# Patient Record
Sex: Male | Born: 1935 | Race: White | Hispanic: No | Marital: Married | Smoking: Former smoker
Health system: Southern US, Community
[De-identification: ages and names within clinical notes are randomized; demographics above are authoritative.]

## PROBLEM LIST (undated history)

## (undated) DIAGNOSIS — D649 Anemia, unspecified: Secondary | ICD-10-CM

## (undated) DIAGNOSIS — Z85038 Personal history of other malignant neoplasm of large intestine: Secondary | ICD-10-CM

## (undated) DIAGNOSIS — G629 Polyneuropathy, unspecified: Secondary | ICD-10-CM

## (undated) DIAGNOSIS — M25552 Pain in left hip: Secondary | ICD-10-CM

## (undated) DIAGNOSIS — R269 Unspecified abnormalities of gait and mobility: Secondary | ICD-10-CM

## (undated) DIAGNOSIS — N400 Enlarged prostate without lower urinary tract symptoms: Secondary | ICD-10-CM

## (undated) DIAGNOSIS — E119 Type 2 diabetes mellitus without complications: Secondary | ICD-10-CM

## (undated) DIAGNOSIS — Z8639 Personal history of other endocrine, nutritional and metabolic disease: Secondary | ICD-10-CM

## (undated) DIAGNOSIS — K922 Gastrointestinal hemorrhage, unspecified: Secondary | ICD-10-CM

## (undated) DIAGNOSIS — R569 Unspecified convulsions: Secondary | ICD-10-CM

## (undated) DIAGNOSIS — R1310 Dysphagia, unspecified: Secondary | ICD-10-CM

## (undated) DIAGNOSIS — I499 Cardiac arrhythmia, unspecified: Secondary | ICD-10-CM

## (undated) DIAGNOSIS — C801 Malignant (primary) neoplasm, unspecified: Secondary | ICD-10-CM

## (undated) DIAGNOSIS — I10 Essential (primary) hypertension: Secondary | ICD-10-CM

## (undated) DIAGNOSIS — I5020 Unspecified systolic (congestive) heart failure: Secondary | ICD-10-CM

## (undated) DIAGNOSIS — I714 Abdominal aortic aneurysm, without rupture: Secondary | ICD-10-CM

## (undated) DIAGNOSIS — I4891 Unspecified atrial fibrillation: Secondary | ICD-10-CM

## (undated) DIAGNOSIS — L723 Sebaceous cyst: Secondary | ICD-10-CM

## (undated) HISTORY — PX: COLON SURGERY: SHX602

## (undated) HISTORY — PX: APPENDECTOMY: SHX54

---
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
## 1998-09-10 ENCOUNTER — Emergency Department (HOSPITAL_COMMUNITY): Admission: EM | Admit: 1998-09-10 | Discharge: 1998-09-10 | Payer: Self-pay | Admitting: Emergency Medicine

## 1998-09-10 ENCOUNTER — Encounter: Payer: Self-pay | Admitting: Emergency Medicine

## 1998-09-11 ENCOUNTER — Encounter: Payer: Self-pay | Admitting: Emergency Medicine

## 1998-09-29 ENCOUNTER — Encounter: Payer: Self-pay | Admitting: General Surgery

## 1998-09-30 ENCOUNTER — Ambulatory Visit (HOSPITAL_COMMUNITY): Admission: RE | Admit: 1998-09-30 | Discharge: 1998-10-01 | Payer: Self-pay | Admitting: General Surgery

## 1998-10-02 ENCOUNTER — Emergency Department (HOSPITAL_COMMUNITY): Admission: EM | Admit: 1998-10-02 | Discharge: 1998-10-02 | Payer: Self-pay | Admitting: Emergency Medicine

## 1998-10-05 ENCOUNTER — Encounter: Payer: Self-pay | Admitting: Surgery

## 1998-10-05 ENCOUNTER — Emergency Department (HOSPITAL_COMMUNITY): Admission: EM | Admit: 1998-10-05 | Discharge: 1998-10-05 | Payer: Self-pay | Admitting: Emergency Medicine

## 1998-10-20 ENCOUNTER — Encounter: Payer: Self-pay | Admitting: Emergency Medicine

## 1998-10-20 ENCOUNTER — Emergency Department (HOSPITAL_COMMUNITY): Admission: EM | Admit: 1998-10-20 | Discharge: 1998-10-20 | Payer: Self-pay | Admitting: Emergency Medicine

## 1998-11-02 ENCOUNTER — Ambulatory Visit (HOSPITAL_COMMUNITY): Admission: RE | Admit: 1998-11-02 | Discharge: 1998-11-02 | Payer: Self-pay | Admitting: Gastroenterology

## 1999-01-17 ENCOUNTER — Emergency Department (HOSPITAL_COMMUNITY): Admission: EM | Admit: 1999-01-17 | Discharge: 1999-01-17 | Payer: Self-pay | Admitting: Emergency Medicine

## 1999-10-04 ENCOUNTER — Emergency Department (HOSPITAL_COMMUNITY): Admission: EM | Admit: 1999-10-04 | Discharge: 1999-10-04 | Payer: Self-pay | Admitting: Emergency Medicine

## 1999-10-24 ENCOUNTER — Ambulatory Visit (HOSPITAL_COMMUNITY): Admission: RE | Admit: 1999-10-24 | Discharge: 1999-10-24 | Payer: Self-pay | Admitting: Orthopedic Surgery

## 1999-10-24 ENCOUNTER — Encounter: Payer: Self-pay | Admitting: Orthopedic Surgery

## 1999-11-03 ENCOUNTER — Encounter: Admission: RE | Admit: 1999-11-03 | Discharge: 1999-12-05 | Payer: Self-pay | Admitting: Orthopedic Surgery

## 1999-12-04 ENCOUNTER — Encounter: Payer: Self-pay | Admitting: Orthopedic Surgery

## 1999-12-04 ENCOUNTER — Ambulatory Visit (HOSPITAL_BASED_OUTPATIENT_CLINIC_OR_DEPARTMENT_OTHER): Admission: RE | Admit: 1999-12-04 | Discharge: 1999-12-05 | Payer: Self-pay | Admitting: Orthopedic Surgery

## 1999-12-07 ENCOUNTER — Encounter: Admission: RE | Admit: 1999-12-07 | Discharge: 2000-03-06 | Payer: Self-pay | Admitting: Orthopedic Surgery

## 2000-05-30 ENCOUNTER — Inpatient Hospital Stay (HOSPITAL_COMMUNITY): Admission: EM | Admit: 2000-05-30 | Discharge: 2000-05-31 | Payer: Self-pay | Admitting: Emergency Medicine

## 2000-05-30 ENCOUNTER — Encounter: Payer: Self-pay | Admitting: Emergency Medicine

## 2000-05-31 ENCOUNTER — Encounter: Payer: Self-pay | Admitting: Infectious Diseases

## 2000-12-24 ENCOUNTER — Encounter: Payer: Self-pay | Admitting: Emergency Medicine

## 2000-12-24 ENCOUNTER — Emergency Department (HOSPITAL_COMMUNITY): Admission: EM | Admit: 2000-12-24 | Discharge: 2000-12-24 | Payer: Self-pay | Admitting: Emergency Medicine

## 2001-02-17 ENCOUNTER — Ambulatory Visit (HOSPITAL_COMMUNITY): Admission: RE | Admit: 2001-02-17 | Discharge: 2001-02-17 | Payer: Self-pay | Admitting: Orthopedic Surgery

## 2001-02-17 ENCOUNTER — Encounter: Payer: Self-pay | Admitting: Orthopedic Surgery

## 2001-04-20 ENCOUNTER — Observation Stay (HOSPITAL_COMMUNITY): Admission: EM | Admit: 2001-04-20 | Discharge: 2001-04-20 | Payer: Self-pay | Admitting: Emergency Medicine

## 2001-04-30 ENCOUNTER — Ambulatory Visit (HOSPITAL_COMMUNITY): Admission: RE | Admit: 2001-04-30 | Discharge: 2001-04-30 | Payer: Self-pay | Admitting: Gastroenterology

## 2001-05-19 ENCOUNTER — Encounter: Payer: Self-pay | Admitting: Orthopedic Surgery

## 2001-05-22 ENCOUNTER — Observation Stay (HOSPITAL_COMMUNITY): Admission: RE | Admit: 2001-05-22 | Discharge: 2001-05-23 | Payer: Self-pay | Admitting: Orthopedic Surgery

## 2001-07-13 ENCOUNTER — Encounter: Payer: Self-pay | Admitting: Emergency Medicine

## 2001-07-13 ENCOUNTER — Emergency Department (HOSPITAL_COMMUNITY): Admission: EM | Admit: 2001-07-13 | Discharge: 2001-07-13 | Payer: Self-pay | Admitting: Emergency Medicine

## 2001-11-07 ENCOUNTER — Encounter: Admission: RE | Admit: 2001-11-07 | Discharge: 2001-11-07 | Payer: Self-pay | Admitting: Family Medicine

## 2001-11-13 ENCOUNTER — Encounter: Payer: Self-pay | Admitting: *Deleted

## 2001-11-13 ENCOUNTER — Encounter: Admission: RE | Admit: 2001-11-13 | Discharge: 2001-11-13 | Payer: Self-pay | Admitting: *Deleted

## 2001-11-28 ENCOUNTER — Encounter: Admission: RE | Admit: 2001-11-28 | Discharge: 2001-11-28 | Payer: Self-pay | Admitting: Family Medicine

## 2001-12-11 ENCOUNTER — Observation Stay (HOSPITAL_COMMUNITY): Admission: EM | Admit: 2001-12-11 | Discharge: 2001-12-12 | Payer: Self-pay

## 2001-12-11 ENCOUNTER — Encounter: Payer: Self-pay | Admitting: Emergency Medicine

## 2001-12-25 ENCOUNTER — Encounter: Admission: RE | Admit: 2001-12-25 | Discharge: 2001-12-25 | Payer: Self-pay | Admitting: Family Medicine

## 2001-12-25 ENCOUNTER — Ambulatory Visit (HOSPITAL_COMMUNITY): Admission: RE | Admit: 2001-12-25 | Discharge: 2001-12-25 | Payer: Self-pay | Admitting: Family Medicine

## 2002-01-12 ENCOUNTER — Ambulatory Visit (HOSPITAL_COMMUNITY): Admission: RE | Admit: 2002-01-12 | Discharge: 2002-01-12 | Payer: Self-pay | Admitting: Family Medicine

## 2002-01-12 ENCOUNTER — Encounter: Admission: RE | Admit: 2002-01-12 | Discharge: 2002-01-12 | Payer: Self-pay | Admitting: Family Medicine

## 2003-10-19 ENCOUNTER — Emergency Department (HOSPITAL_COMMUNITY): Admission: EM | Admit: 2003-10-19 | Discharge: 2003-10-20 | Payer: Self-pay | Admitting: Emergency Medicine

## 2003-11-11 ENCOUNTER — Emergency Department (HOSPITAL_COMMUNITY): Admission: EM | Admit: 2003-11-11 | Discharge: 2003-11-11 | Payer: Self-pay | Admitting: Family Medicine

## 2004-11-13 ENCOUNTER — Inpatient Hospital Stay (HOSPITAL_COMMUNITY): Admission: EM | Admit: 2004-11-13 | Discharge: 2004-11-15 | Payer: Self-pay | Admitting: Family Medicine

## 2004-12-29 ENCOUNTER — Emergency Department (HOSPITAL_COMMUNITY): Admission: EM | Admit: 2004-12-29 | Discharge: 2004-12-29 | Payer: Self-pay | Admitting: Family Medicine

## 2005-05-13 ENCOUNTER — Emergency Department (HOSPITAL_COMMUNITY): Admission: EM | Admit: 2005-05-13 | Discharge: 2005-05-14 | Payer: Self-pay | Admitting: Emergency Medicine

## 2006-09-20 ENCOUNTER — Inpatient Hospital Stay: Payer: Self-pay | Admitting: Internal Medicine

## 2006-09-20 ENCOUNTER — Other Ambulatory Visit: Payer: Self-pay

## 2006-10-11 ENCOUNTER — Inpatient Hospital Stay: Payer: Self-pay | Admitting: General Surgery

## 2006-10-11 ENCOUNTER — Other Ambulatory Visit: Payer: Self-pay

## 2006-10-12 ENCOUNTER — Other Ambulatory Visit: Payer: Self-pay

## 2006-12-09 ENCOUNTER — Emergency Department: Payer: Self-pay | Admitting: Emergency Medicine

## 2007-08-24 ENCOUNTER — Emergency Department (HOSPITAL_COMMUNITY): Admission: EM | Admit: 2007-08-24 | Discharge: 2007-08-24 | Payer: Self-pay | Admitting: Family Medicine

## 2007-11-29 ENCOUNTER — Encounter: Admission: RE | Admit: 2007-11-29 | Discharge: 2007-11-29 | Payer: Self-pay | Admitting: Neurosurgery

## 2008-03-09 ENCOUNTER — Emergency Department (HOSPITAL_COMMUNITY): Admission: EM | Admit: 2008-03-09 | Discharge: 2008-03-09 | Payer: Self-pay | Admitting: Family Medicine

## 2008-04-15 ENCOUNTER — Other Ambulatory Visit: Payer: Self-pay

## 2008-04-16 ENCOUNTER — Inpatient Hospital Stay: Payer: Self-pay | Admitting: Internal Medicine

## 2009-01-22 ENCOUNTER — Emergency Department (HOSPITAL_COMMUNITY): Admission: EM | Admit: 2009-01-22 | Discharge: 2009-01-23 | Payer: Self-pay | Admitting: Emergency Medicine

## 2009-02-22 ENCOUNTER — Emergency Department (HOSPITAL_COMMUNITY): Admission: EM | Admit: 2009-02-22 | Discharge: 2009-02-22 | Payer: Self-pay | Admitting: Family Medicine

## 2009-07-01 ENCOUNTER — Emergency Department: Payer: Self-pay | Admitting: Emergency Medicine

## 2010-08-22 ENCOUNTER — Emergency Department: Payer: Self-pay | Admitting: Emergency Medicine

## 2011-01-02 LAB — DIFFERENTIAL
Basophils Absolute: 0 10*3/uL (ref 0.0–0.1)
Eosinophils Absolute: 0.3 10*3/uL (ref 0.0–0.7)
Eosinophils Relative: 4 % (ref 0–5)
Monocytes Absolute: 0.9 10*3/uL (ref 0.1–1.0)

## 2011-01-02 LAB — POCT I-STAT, CHEM 8
BUN: 22 mg/dL (ref 6–23)
Chloride: 110 mEq/L (ref 96–112)
Glucose, Bld: 116 mg/dL — ABNORMAL HIGH (ref 70–99)
HCT: 36 % — ABNORMAL LOW (ref 39.0–52.0)
Sodium: 142 mEq/L (ref 135–145)

## 2011-01-02 LAB — CBC
HCT: 35.2 % — ABNORMAL LOW (ref 39.0–52.0)
MCV: 90.4 fL (ref 78.0–100.0)
Platelets: 171 10*3/uL (ref 150–400)
RDW: 14.4 % (ref 11.5–15.5)

## 2011-01-22 ENCOUNTER — Emergency Department: Payer: Self-pay | Admitting: Unknown Physician Specialty

## 2011-02-09 NOTE — Op Note (Signed)
[facility]  Patient:    Andrew, [name] Visit Number: [id number] MRN: [id number]          Service Type: SUR Location: 4W [id number] Attending Physician:  Carlyon Shadow Proc. Date: 05/22/01 Admit Date:  05/22/2001                             Operative Report  PREOPERATIVE DIAGNOSES: 1. 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 2. Right shoulder acromioclavicular joint arthrosis.  POSTOPERATIVE DIAGNOSES: 1. 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 2. Right shoulder acromioclavicular joint arthrosis.  PROCEDURES: 1. Right shoulder glenohumeral joint diagnostic arthroscopy. 2, Arthroscopic subacromial decompression and bursectomy. 3. Arthroscopic distal clavicle resection.  SURGEON:  Metta Clines. Supple, M.D.  ANESTHESIA:  General endotracheal as well as preoperative intrascalene block.  ESTIMATED BLOOD LOSS:  Minimal.  HISTORY:  [name] is a 77 year old gentleman, who has had persistent difficulties with right shoulder pain, weakness, and limitations in motion with clinical examination showing an exquisitely tender acromioclavicular joint and global weakness of the rotator cuff.  Radiographs show evidence for marked arthropathy of the [facility] joint as well as a type 3 acromial morphology. Preoperative MRI scan did not show any obvious full-thickness rotator cuff tear.  Due to his ongoing pain and functional limitations, he is brought to the operating room at this time for planned right shoulder arthroscopy as described above.  Preoperatively, he was counseled on treatment options as well as risks versus benefits thereof.  Possible complications of bleeding, infection, neurovascular injury, persistence of pain, loss of motion, and possible need for additional surgery were reviewed.  He understands and accepts and agrees with our planned procedure.  DESCRIPTION OF PROCEDURE:  After undergoing routine  preoperative evaluation, the patient had an intrascalene block placed in the holding area by the Anesthesia Department.  He was then placed supine on the operating table and underwent smooth induction of general endotracheal anesthesia.  He was turned to the left lateral decubitus position on the beanbag and appropriately padded and protected.  The right arm was suspended at the 70/30 position with 10 pounds of traction.  Examination under anesthesia did not showed no glenohumeral instability and, in fact, he had a fairly tight glenohumeral joint.  The right shoulder girdle region was then sterilely prepped and draped in the standard fashion.  A standard posterior portal was established in the glenohumeral joint, and diagnostic arthroscopy was performed.  The glenohumeral joint capsule was markedly diminished with a very small joint volume.  An inspection of the rotator cuff did not show any obvious detachment or fraying.  The biceps appeared to be in good condition.  Certainly there was no evidence for glenohumeral instability.  The articular surfaces appeared to be in good condition as well.  At this point, the arthroscope was removed from the glenohumeral joint, and the arm was then dropped down to 30 degrees of abduction with the arthroscope reintroduced into the subacromial space through the posterior portal, and a direct lateral portal was established in the subacromial space.  We removed the bursal tissue utilizing a combination of the shaver and the Arthex wand.  We then used the wand to remove the periosteum from the undersurface of the anterior half of the acromion.  A bur was then utilized to perform a subacromial decompression, creating a type 1 acromial morphology.  We also outlined the distal clavicle, and there was noted to  be very large peripheral osteophytes and several isolated fragments of bone below to the previous prominence as well as degenerative changes.   We established a portal directly anterior to the distal clavicle, and the distal clavicle resection was performed.  We also removed several free osteophytic fragments.  Care was taken to make sure that the entire circumference of the distal clavicle could be visualized to ensure adequate removal of bone.  The [facility] ligament was identified and resected distally.  We utilized the Arthrex want to obtain hemostasis.  We then completed the subacromial bursectomy and carefully inspected the bursal surface of the rotator cuff and saw no obvious fraying or defects in the rotator cuff.  At this point, all fluid and instruments were then removed.  The portals were closed with staples. Xeroform and then a bulky dry dressing was then taped over the right shoulder. The right arm was placed in a sling.  The patient was rolled supine, extubated, and taken to recovery room in stable condition. Attending Physician:  Carlyon Shadow DD:  05/22/01 TD:  05/22/01 Job: [id number]

## 2011-02-09 NOTE — Op Note (Signed)
[facility]  Patient:    Andrew [name], Andrew [name]                         MRN: [id number] Proc. Date: 12/04/99 Adm. Date:  [id number] Attending:  Anderson Malta                           Operative Report  PREOPERATIVE DIAGNOSIS:  Left frozen shoulder and subacromial impingement.  POSTOPERATIVE DIAGNOSIS:  Left frozen shoulder and subacromial impingement.  PROCEDURE:  Left shoulder _______ under anesthesia, diagnostic and operative arthroscopy, synovectomy, and subacromial decompression.  SURGEON:  Meredith Pel, M.D.  ANESTHESIA:  General endotracheal.  ESTIMATED BLOOD LOSS:  10 cc.  DRAINS:  None.  Postoperative Marcaine pump is placed.  INDICATIONS:  The patient is a 77 year old patient with diabetes who has a four  month history of left shoulder pain which is worsening despite conservative intervention including physical therapy and injection.  The patients range of motion was also decreasing.  The patient presents for operative management.  OPERATIVE FINDINGS: 1. Examination under anesthesia - external rotation of 15 degrees, abduction  90 right and 45 left, external rotation 90 degrees, abduction 105 right and    55 left, internal rotation 90 degrees, abduction 60 right and 10 left, adduction    110 right and 60 left, forward flexion 180 right and 90 left.  Post manipulation    range of motion on the left side external rotation 15 degrees, abduction  90 degrees, external rotation 90 degrees, abduction 90 degrees, internal    rotation 90 degrees, abduction 60 degrees, adduction 150 degrees, forward flexion    180 degrees. 2. Diagnostic and operative arthroscopy:    a. Generalized synovitis around the rotator ___ biceps tendon, as well as       throughout the rest of the capsule.    b. Intact rotator cuff.  There was some partial thickness tearing on the       articular side, which was only about 10% thickness of the rotator cuff.    c.  Intact biceps tendon.    d. Intact articular surface.    e. Significant bursitis and hooked acromion within the subacromial space.  DESCRIPTION OF PROCEDURE:  The patient was brought to the operating room where general endotracheal anesthesia was induced.  Preoperative IV antibiotics were administered.  The right shoulder was examined under anesthesia.  The left shoulder was examined under anesthesia.  The left shoulder was then manipulated by first  placing a hand into the axilla and proximal humerus, and then taking the patient into full forward flexion.  This was performed in a general controlled fashion, and the tearing of the tissue was palpable.  Next, the patient was taken into adduction and internal rotation across his chest. Next, the patient was taken into abduction.  Following this, the patient was taken into abduction and external rotation to be followed by internal rotation with the hands at the condylar ridge.  Postoperative range of motion comparable to the contralateral side was obtained.  The patient was then placed in the lateral decubitus position in about 15 degrees of forward flexion and 70 degrees of adduction of the shoulder.  Right axilla and cranial nerve was well-padded. The skin was prepped with Betadine followed by DuraPrep.  The entire arm was prepped. It was then draped in a sterile manner.  Topographical anatomy of the  shoulder as identified including the anterior, posterior, and lateral margin of the acromion, as well as the AC joint and coracoid process.  A skin incision parallel to the [employer] was 2 cm inferomedial to the posterolateral margin of the acromion.  The shoulder joint was entered.  The anterior portal was created under direct visualization with a spinal needle.  The intra-articular shoulder joint was inspected.  The biceps and biceps anchor was found to be intact.  Generalized synovitis was noted within the rotator _______ and  around the biceps anchor, and then diffusely around the capsule.  Capsular tearing could be observed around the glenoid labrum.  The shaver was then introduced and a partial synovectomy was performed.  The rotator interval was also fully released using the shaver.  The  scope was placed through the anterior portal and the posterior aspect of the glenohumeral joint was intact.  No arthritis was noted.  The rotator cuff was also intact.  There was some under surface fraying 10% thickness which was debrided.  At this point, the subacromial space was entered.  A lateral portal was created  about two fingerbreadths away from the junction of the anterior middle third of the acromion.  Bursectomy was performed.  The anterior hook of the acromion was identified, and then shaved back with the bur.  The anterior third of the acromion was flattened.  Good decompression was obtained.  Articular side of the rotator  cuff was intact.  The joint was then irrigated.  10 cc of Marcaine was placed into the subacromial space.  A Marcaine pump catheter was then placed into the shoulder joint through a trocar.  Portals were then closed using 3-0 Vicryl suture.  I pulled the drain out about an inch after suturing the portals closed.  A bulky dressing was then applied to the shoulder.  Postoperative x-rays pending. The patient tolerated the procedure well without immediate complication. DD:  12/04/99 TD:  12/04/99 Job: [id number]

## 2011-02-09 NOTE — H&P (Signed)
[facility]  Patient:    Andrew [name], Andrew [name] Visit Number: [id number] MRN: [id number]          Service Type: Attending:  Metta Clines. Supple, M.D. Dictated by:   Alexzandrew L. Perkins, P.A.-C. Adm. Date:  05/22/01                           History and Physical  DATE OF BIRTH:  [birth year]  CHIEF COMPLAINT:  Right shoulder pain.  HISTORY OF PRESENT ILLNESS:  The patient is a 77 year old, right-hand dominant male who was referred over from Dr. Colin Rhein to Dr. Justice Britain for evaluation of his right shoulder.  The patient states the pain started to occur in February 2002.  He denies any trauma or accident which he attributes to the onset of his pain; however, he does state that the pain has been progressive over the past several months.  He has had a previous ORIF of a distal clavicle fracture back in his teenage years; however, he has been functioning very well since that time.  He comes in for evaluation and is found to have weakness and markedly positive impingement signs noted on exam.  He was sent for an MRI which did show some degenerative changes in the [facility] join and also spurring in the subacromial region predisposing to rotator cuff impingement.  There was no obvious tear on the MRI.  Due to the fact there were significant findings, and the pain is starting to interfere with his daily activities, it is felt that he would benefit from undergoing surgical intervention.  He is subsequently admitted to [place] causes a rash, DILANTIN causes a rash.  Intolerances to DEPAKOTE and DEPAKENE.  The patient did not tolerate these medications very well.  CAUCASIAN MALE 1. Zonisamide 100 mg 2 tablets p.o. b.i.d. 2. Monopril 10 mg p.o. q.a.m. 3. Carbamazepine 200 mg 3 tablets p.o. b.i.d. 4. Metformin 500 mg 1 tablet p.o. b.i.d. 5. Hydroxyzine 25 mg 1 p.o. at lunch time and at night time.  PAST MEDICAL HISTORY:   Seizure disorder (his last seizure was approximately seven months ago), hypertension, and type 2 diabetes.  PAST SURGICAL HISTORY:  Open reduction internal fixation clavicle fracture, appendectomy, cholecystectomy.  SOCIAL HISTORY:  He is married and has two daughters.  He has been a previous smoker for approximately 35 years, however, quit approximately 32 years ago. Denies use of alcohol products.  He is a retired Building control surveyor.  He was in the [employer] for six years.  FAMILY HISTORY:  He has a brother living at age 78 with diabetes, mother deceased at age 66 with colon cancer, and father deceased at age 96 with prostate cancer.  PRIMARY MEDICAL DOCTOR:  The patient sees the staff at [facility] in [place] for his medical history.  REVIEW OF SYSTEMS:  GENERAL:  No fevers or chills or night sweats. NEUROLOGIC:  The patient does have a history of seizure disorders, approximately has a seizure every two to three months; however, he has been well-controlled recently on his medications and his last seizure was approximately seven months ago.  No syncopal episodes or paralysis. RESPIRATORY:  No shortness of breath, productive cough, or hemoptysis. CARDIOVASCULAR:  No chest pain, angina, or orthopnea.  GI:  No nausea or vomiting, diarrhea, or constipation.  GU:  No dysuria, hematuria, or discharge.  ENDOCRINE:  The patient is a type 2 diabetic, controlled with medications  and diet.  PHYSICAL EXAMINATION:  VITAL SIGNS:  Pulse 56, respirations 16, and blood pressure 118/78.  GENERAL:  The patient is a 77 year old white male, well-developed, well-nourished, appears to be in no acute distress.  He is alert, oriented, and cooperative at time of exam.  HEENT:  Normocephalic, atraumatic.  Pupils round and reactive.  The patient is known to wear glasses.  He is edentulous.  Oropharynx is clear.  He does have upper and lower dentures.  NECK:  Supple without carotid bruits.  LUNGS:  Clear.  HEART:  Regular  rate and rhythm with somewhat of a bradycardic rhythm at 56. S1, S2 noted.  There is a slight split of the S1, normal S2.  ABDOMEN:  Soft, nontender, and bowel sounds present.  RECTAL/BREAST/GENITALIA:  Not done and not pertinent to present illness.  EXTREMITIES:  Significant to right upper extremity.  He does have pain with extreme flexion and abduction with markedly positive impingement signs noted. Negative drop arm, positive crossover.  +2 pulses noted on right upper extremity.  Motor function grossly intact.  IMPRESSION: 1. Right shoulder pain with impingement syndrome. 2. Hypertension. 3. Type 2 diabetes. 4. Seizure disorder.  PLAN:  The patient will be admitted to [facility] to undergo a right shoulder arthroscopy with subacromial decompression, distal clavicle resection, and a possible rotator cuff repair if indicated.  Surgery will be performed by Dr. Justice Britain. Dictated by:   Alexzandrew L. Perkins, P.A.-C. Attending:  Metta Clines. Supple, M.D. DD:  05/19/01 TD:  05/19/01 Job: [id number]

## 2011-02-09 NOTE — H&P (Signed)
NAME:  Andrew [name], Andrew [name] NO.:  [id number]   MEDICAL RECORD NO.:  [id number]          PATIENT TYPE:  EMS   LOCATION:  MAJO                         FACILITY:  [place]   PHYSICIAN:  Broadus John, MD DATE OF BIRTH:  Dec 03, 1933   DATE OF ADMISSION:  11/13/2004  DATE OF DISCHARGE:                                HISTORY & PHYSICAL   INCOMPLETE DICTATION:   HISTORY OF PRESENT ILLNESS:  Andrew [name] is a 77 year old white man who was  admitted to [facility] for further evaluation of chest pain.   The patient, who has no past history of cardiac disease, presented to the  emergency department with a 2-day history of chest pain.  This chest pain  has been more or less continuous for that period of time.  It is described  as a burning in the left anterior upper chest.  It does not radiate.  It has  not been associated with dyspnea, diaphoresis, or nausea.  It appears to be  exacerbated by movement of the torso and movement of his arms.  It does not  appear to be related to activity, meals, or respirations.  There were no  other exacerbating or ameliorating factors.  He was given nitroglycerin  tablets upon his arrival to the emergency department, and they seemed not to  have much impact on his chest pain.  His chest discomfort has subsided  somewhat, though continues to be present at this time.   PAST MEDICAL HISTORY:  1.  As noted, the patient has no past history of cardiac disease, including      no past history of chest pain, myocardial infarction, coronary artery      disease, congestive heart failure, or arrhythmia.  2.  He has a history of hypertension, for which he is taking medication.  3.  He has a history of dyslipidemia, which is not being treated at this      time.  4.  Borderline diabetes mellitus, which is not being treated at this time.  5.  He stopped smoking over 35 years ago.  6.  There is no history of early coronary artery disease.  7.  The  patient's only other medical problem is that of a seizure disorder,      which is controlled with medications.   OPERATIONS:  1.  Appendectomy.  2.  Cholecystectomy.   SIGNIFICANT INJURIES:  None.   MEDICATIONS:  1.  Tegretol.  2.  Monopril.  3.  Aspirin.   ALLERGIES:  1.  DEPAKOTE.  2.  DILANTIN.  3.  PHENOBARBITAL.   SOCIAL HISTORY:  The patient is a retired Building control surveyor.  He lives with his wife.  He neither smokes nor drinks.   FAMILY HISTORY:  His mother died of cancer (type unknown).  His father died  of prostate cancer.   REVIEW OF SYSTEMS:  The patient notes some recent mild foot swelling.  Review of systems otherwise reveals no problems related to his head, eyes,  ears, nose, mouth, throat, lungs, gastrointestinal system, genitourinary  system, or extremities.  There  is no history of neurologic or psychiatric  disorder.  There is no history of fever, chills, or weight loss.   PHYSICAL EXAMINATION:  VITAL SIGNS:  Blood pressure 123/36, pulse 78 and  regular, respirations 20, temperature 98.0.  GENERAL:  The patient was an older white man in no discomfort.  He was  alert, oriented, appropriate, and responsive.  HEENT:  Head, eyes, nose, and mouth were normal.  NECK:  Without thyromegaly or adenopathy.  Carotid pulses were palpable  bilaterally and without bruits.  CARDIAC:  A normal S1 and S2.  There was no S3, S4, murmur, rub, or click.  Cardiac rhythm was regular.  CHEST:  No chest wall tenderness was noted.  LUNGS:  Clear.  ABDOMEN:  Soft and nontender.  There was no mass, hepatosplenomegaly, bruit,  distention, rebound, guarding, or rigidity.  Bowel sounds were normal.  RECTAL/GENITAL EXAMINATIONS:  Not performed, as they were not pertinent to  the reason for acute care hospitalization.  EXTREMITIES:  Without edema, deviation, or deformity.  Radial and dorsalis  pedis pulses were palpable bilaterally.  Brief screening neurologic survey  was unremarkable.    Dictation ended at this point.      MSC/MEDQ  D:  11/13/2004  T:  11/13/2004  Job:  [id number]

## 2011-02-09 NOTE — Discharge Summary (Signed)
NAME:  Andrew [name], Andrew [name] NO.:  [id number]   MEDICAL RECORD NO.:  [id number]          PATIENT TYPE:  INP   LOCATION:  6524                         FACILITY:  [facility]   PHYSICIAN:  Quay Burow, M.D.   DATE OF BIRTH:  1934/07/06   DATE OF ADMISSION:  11/13/2004  DATE OF DISCHARGE:  11/15/2004                                 DISCHARGE SUMMARY   DISCHARGE DIAGNOSES:  1.  Chest pain, admitted, rule out myocardial infarction protocol, ruled out      by negative enzymes and normal electrocardiograms.  2.  Status post cardiac catheterization during this admission, showing non-      critical mild coronary artery disease.  3.  Hypertension.  4.  Dyslipidemia.  5.  Borderline diabetes mellitus.  6.  Seizure disorder.   HISTORY OF PRESENT ILLNESS:  This is a 77 year old gentleman who presented  to the emergency room with complaints of a history of chest pain that  gradually increased in intensity and frequency, and the patient had  presented to the emergency room at [facility].  He  described it as a burning on the left anterior upper chest and the pain did  not radiate anywhere, and has not been associated with any dyspnea,  diaphoresis, or nausea.  The patient was given nitroglycerin under the  tongue upon his arrival to the emergency room and no significant elevation  of pain was obtained.  His chest discomfort subsided after time passed on,  but the patient still continued to be in pain and some discomfort at the  time of  his physical  examination.   PAST MEDICAL HISTORY:  1.  Significant for seizure disorder, which are controlled with medications.  2.  Borderline diabetes mellitus, which was not being treated at this time.  3.  History of dyslipidemia, which was not being treated at this time.  4.  He has hypertension, for which he usually takes Monopril.   HOSPITAL COURSE:  The patient was admitted on rule out myocardial infarction  protocol.   Started on IV heparin and nitroglycerin.  On November 14, 2004,  he underwent a cardiac catheterization performed by Dr. Quay Burow.  His  catheterization revealed non-critical coronary artery disease with a 60%  stenosis of the obtuse marginal branch of the circumflex and global  hypokinesis.  The ejection fraction was estimated at 40% and his renal  arteries showed normal patency.  The patient tolerated the procedure well  and was transferred to the recovery room in stable condition.   The next morning he was assessed by Dr. Nanine Means. Rollene Fare and felt to be  stable for discharge home.   LABORATORY DATA/PERTINENT STUDIES:  A BMET showed a sodium of 139, potassium  4.6, chloride 106, CO2 of 27, glucose 126, BUN 14, creatinine 1.4, calcium  8.5.  CBC showed a white blood cell count of 8.7, hemoglobin 13.0,  hematocrit 37.1, platelet count 167.  All three sets of cardiac enzymes were  negative.  Hemoglobin A1c was 6.8.  Lipid profile revealed a total  cholesterol of  188, triglycerides 108, HDL cholesterol 37, LDL cholesterol  129.   His electrocardiogram consistently showed sinus rhythm without any  significant changes.   DISCHARGE MEDICATIONS:  1.  Aspirin 81 mg daily.  2.  Lopressor 25 mg b.i.d.  3.  He was instructed to resume the dose of Tegretol as previously      administered.  4.  Dose of Monopril as previously administered.  4.  Zocor 20 mg daily.   DISCHARGE ACTIVITIES:  No driving, no lifting greater than 5 pounds, no  strenuous activity for three days.   DIET:  A low-fat, low-cholesterol diet.   DISCHARGE INSTRUCTIONS:  The patient was instructed to call our office for  any problems with the catheterization site.  The phone number was provided.   FOLLOWUP:  1.  With Dr. Gwenlyn Found in our office for an appointment.  2.  He was also instructed to make an appointment with his primary      physician, to follow up with his serum glucose level.      MK/MEDQ  D:   11/15/2004  T:  11/15/2004  Job:  [id number]   cc:   [facility]   Quay Burow, M.D.  Fax: [phone]

## 2011-02-09 NOTE — Cardiovascular Report (Signed)
NAME:  RHODERICK, KORMOS NO.:  [id number]   MEDICAL RECORD NO.:  [id number]          PATIENT TYPE:  INP   LOCATION:  1828                         FACILITY:  [facility]   PHYSICIAN:  Quay Burow, M.D.   DATE OF BIRTH:  28-Oct-1933   DATE OF PROCEDURE:  11/14/2004  DATE OF DISCHARGE:                              CARDIAC CATHETERIZATION   CLINICAL HISTORY:  Mr. Guijarro is a 77 year old white male with history of  hypertension, dyslipidemia, borderline diabetes with seizure disorder.  He  has had chest pain for two days which was left anterior chest and burning.  Presented to [facility] ER last night.  His enzymes were negative.  His EKG showed  __________  T-wave inversion.  He was heparinized and presents now for  diagnostic coronary arteriography.   PROCEDURE DESCRIPTION:  The patient was brought to the second floor [facility] in the postabsorptive state.  He was  premedicated with p.o. Valium.  His right groin was prepped and shaved in  the usual sterile fashion.  1% Xylocaine was used for local anesthesia.  A 6-  French sheath was inserted into the right femoral artery using standard  Seldinger technique.  6-French right and left Judkins diagnostic catheters  along with 6-French pigtail catheter were used for selective coronary  angiography, left ventriculography, and distal abdominal aortography.  Visipaque dye was used for the entirety of the case.  Retrograde aortic,  left ventricular, and pullback pressures were recorded.   HEMODYNAMICS:  1.  Aortic systolic pressure 123456, diastolic pressure 92.  2.  Left ventricular systolic pressure 123456, end-diastolic pressure 29.   SELECTIVE CORONARY ANGIOGRAPHY:  Left main:  There was no true left main.  There was double ostium, circumflex, and LAD.   LAD:  This was selectively cannulated using a JL3.5 diagnostic catheter.  It  was free of significant disease.   Left circumflex:  This was selectively  cannulated using a JL4 diagnostic  catheter.  It was a dominant vessel that gave off a high moderate to large  marginal branch in the ramus distribution.  This had a 50% proximal stenosis  but the remainder of the vessel was free of significant disease.   Right coronary artery:  Nondominant and free of significant disease.   LEFT VENTRICULOGRAM:  RAO left ventriculogram was performed using 25 mL of  Visipaque dye at 12 mL/second.  The LV appeared mildly dilated.  It was  globally hypokinetic with an EF estimated visually of approximately 40%.   DISTAL ABDOMINAL AORTOGRAPHY:  Distal abdominal aortogram was performed  using 20 mL of Visipaque dye at 20 mL/second.  The renal arteries were  widely patent.  The infrarenal abdominal aorta and the iliac bifurcation  were free of significant atherosclerotic changes.   IMPRESSION:  Mr. Usman has noncritical coronary artery disease with moderate  left ventricular dysfunction.  I am unsure of the etiology of his chest  pain, though his enzymes are negative.  Plans will be for medical therapy of  hypertension, left ventricular dysfunction.   ACT was measured and the  sheaths were removed.  Pressure was held on the  groin to achieve hemostasis.  Patient left the laboratory in stable  condition.      JB/MEDQ  D:  11/14/2004  T:  11/14/2004  Job:  [id number]   cc:   Cath Lab

## 2011-02-09 NOTE — H&P (Signed)
[place]  Patient:    Andrew [name], Andrew [name] Visit Number: [id number] MRN: [id number]          Service Type: MED Location: [phone] Attending Physician:  Zigmund Gottron Dictated by:   Delena Serve, M.D. Admit Date:  12/11/2001 Discharge Date: 12/12/2001   CC:         Cleopatra Cedar, M.D., [facility]   History and Physical  DATE OF BIRTH:  09/14/1934  SERVICE:  [facility].  PRIMARY CARE PHYSICIAN:  Dr. Cleopatra Cedar at [facility]  CHIEF COMPLAINT:  Chest pain.  HISTORY OF PRESENT ILLNESS:  Patient is a 77 year old male with hypertension, diabetes mellitus and seizure disorder, who presented with a two-day history of chest pain that started at approximately 1 oclock yesterday afternoon; chest pain started in the left upper quadrant of his abdomen and left chest, radiated across his chest and slight stiffness in the back of the neck. Denies shortness of breath.  Denies diaphoresis.  Denies nausea or vomiting or burping.  No prior chest pain like this.  Did have some chest pain in the past in which he had an exercise stress test which was normal by report.  This chest pain was nonexertional, onset at rest.  No history of exertional chest pain.  Patient did continue to mow his yard at home and had no pain with doing this.  Yesterday, chest pain resolve.  Once he had supper, he was leaning over, then the pain started.  Feels different than heartburn, as he has had heartburn in the past.  Positive palpitations.  No nausea or vomiting.  REVIEW OF SYSTEMS:  No fever or chills.  CARDIOVASCULAR:  Positive palpitations.  GI:  No nausea, vomiting, diarrhea or constipation.  The patient did have a normal bowel movement yesterday.  NEUROLOGIC:  No weakness. MUSCULOSKELETAL:  Denies weakness or paresthesia.  ENT:  No hearing difficulty.  Does admit to having  some sinus problems that are worse in the spring, summer and fall.  EYES:  Patient wears corrective lenses, states his vision has always been bad.  RESPIRATORY:  Denies shortness of breath, dyspnea.  No orthopnea or PND.  SKIN:  Multiple skin rashes secondary to seizure medications.  HEMATOLOGY:  Denies any easy bruising or bleeding.  PROBLEM LIST: 1. Hypertension. 2. Diabetes mellitus, type 2, diagnosed three to four years ago.  History of    Metformin treatment, but was taken off a few months ago secondary to good    control, now diet-controlled. 3. Seizure disorder diagnosed in 1968.  He is followed at the [facility]. 4. Hives.  Patient has had an allergic reaction to his anti-epileptic    medications for five years for which he takes hydroxyzine. 5. History of GI bleed a few years ago.  MEDICATIONS: 1. Tegretol 200 mg two tabs p.o. b.i.d. 2. Lisinopril q.d. 3. Hydroxyzine two tabs p.o. q.p.m. 4. Zonisamide b.i.d.  ALLERGIES:  LAMICTAL, DILANTIN, PHENOBARBITAL, DEPAKOTE and DEPACON caused hives.  SOCIAL HISTORY:  Patient lives with his wife and ______ in [place].  They do have one cat.  He quit smoking 25 years ago, had a total of 30-pack-year history of smoking.  Denies alcohol.  Denies other drugs. Retired Building control surveyor, formerly in [employer].  FAMILY HISTORY:  Father died with prostate cancer at age 43, also had a stroke.  Mother died of colon  cancer and heart disease.  Brother with diabetes mellitus and heart disease.  PROCEDURES: 1. Endoscopy approximately six years ago. 2. Colonoscopy two years ago secondary to a GI bleed at that time. 3. Appendectomy in 1965. 4. Cholecystectomy in 1998. 5. Clavicular fracture at 77 years of age.  OTHER MEDICAL DOCTORS:  Patient is followed at the [facility].  PHYSICAL EXAMINATION:  VITAL SIGNS:  Temperature 98, blood pressure 138/84, pulse 55, respiratory rate is 18, ______%  saturation on room air.  GENERAL:  Alert, in no apparent distress.  A/O x4.  HEENT:  Normocephalic, atraumatic.  PERRLA.  EOMI.  Cerumen obstructing the TM canals bilaterally.  He is edentulous, however, moist oral mucosa.  NECK:  No masses.  No thyromegaly.  No lymphadenopathy.  RESPIRATORY:  Clear to auscultation bilaterally.  No wheezes, rales or rhonchi.  No chest wall tenderness.  In no distress.  CARDIOVASCULAR:  Regular rate and rhythm with no murmur and had a non-displaced PMI.  EXTREMITIES:  Pedal edema 1+.  Peripheral pulses were intact.  MUSCULOSKELETAL:  Strength is 5/5 in upper and lower extremities.  Capillary refill less than 2 seconds in extremities.  GI:  Normoactive bowel sounds, nontender, nondistended.  No hepatosplenomegaly.  Slightly obese.  Unable to palpate abdominal aortic pulse.  SKIN:  Urticarial erythematous patches on multiple areas of arms noted and on antecubital areas of arms.  NEUROLOGIC:  CN II-XII intact.  Reflexes 2+, upper and lower extremities bilaterally.  GENERAL:  Sensation intact.  LABORATORY AND ACCESSORY DATA:  White blood cell 6.3, hemoglobin 12.5, hematocrit 37, platelets 188,000.  Sodium 140, potassium 3.6, chloride 108, bicarb 27, BUN 13, creatinine 1.1, glucose of 159, AST 15, ALT 12, alkaline phosphatase 71, total bilirubin 0.4, albumin 3.5.  Tegretol level is 10.4.  CK 74, 46.  Troponin 0.01, 0.01.  Acute abdominal series is negative.  ASSESSMENT AND PLAN:  [age]-year-old male with history of hypertension, diabetes mellitus, type 2, seizure disorder with chest pain.  1. Chest pain appears noncardiac at present secondary to two sets of negative    enzymes, atypical in nature, does not appear to be musculoskeletal.    Apparently had "normal" exercise stress test in past.  Gastrointestinal    workup in past for gastrointestinal bleed.  No signs or symptoms of    gastrointestinal bleed currently.  Normal bowel movement  two days ago.     Will guaiac stools here.  Hemoglobin is stable.  Rule out with three total    sets of cardiac enzymes.  We will check a lipid panel and check old records    for time of history as patient is a somewhat poor historian. 2. Seizure disorder:  Patient is followed at [facility] seizure clinic.    We will continue Tegretol, which is currently therapeutic; also continue    zonisamide.  We will also continue hydroxyzine for patients itching    secondary to his anti-epileptics.  We will discuss this regimen with the    pharmacy team, but as followed at seizure clinic, most likely will not    change any medications at this time as appearing to be controlling his    seizures. 3. Hypertension, elevated on presentation to emergency room with systolics in    [id number] but currently stable.  We will continue lisinopril at 10 mg q.d.    Patient unsure if he was on 10 mg or higher dose.  We will follow blood    pressures and titrate  accordingly. 4. Gastrointestinal prophylaxis:  We will start on Protonix 40 mg p.o. q.d. 5. Disposition:  Probably discharge with outpatient stress test if cardiac    enzymes negative.  Will discuss further disposition with team. Dictated by:   Delena Serve, M.D. Attending Physician:  Zigmund Gottron DD:  12/12/01 TD:  12/13/01 Job: [id number]

## 2011-02-09 NOTE — H&P (Signed)
NAME:  Andrew [name], Andrew [name] NO.:  [id number]   MEDICAL RECORD NO.:  [id number]          PATIENT TYPE:  EMS   LOCATION:  MAJO                         FACILITY:  [facility]   PHYSICIAN:  Broadus John, MD DATE OF BIRTH:  15-May-1934   DATE OF ADMISSION:  11/13/2004  DATE OF DISCHARGE:                                HISTORY & PHYSICAL   Continuation.....   LABORATORY DATA:  Electrocardiogram revealed normal sinus rhythm with  frequent PVC's, inferior and anterolateral T wave inversion was present  suggestive of possible ischemia.  There were no changes specific for  myocardial infarction.   The chest radiograph, according to the radiologist, demonstrated  cardiomegaly and COPD.  All laboratory studies were pending at the time of  this dictation.   IMPRESSION:  1.  Chest pain, rule out cardiac ischemia.  Two days of left anterior chest      burning, worse with movement of the torso or arms.  Multiple risk      factors of coronary artery disease.  2.  Hypertension.  3.  Dyslipidemia.  4.  Borderline diabetes mellitus.  5.  Seizure disorder.   PLAN:  Cardiac stepdown unit.  Serial cardiac enzymes.  Aspirin.  Intravenous heparin.  Intravenous nitroglycerin.  Metoprolol.  Fasting lipid  profile and blood sugar.  Chest CT scan to exclude aortic dissection and  pulmonary embolism.  Further measures per Quay Burow, M.D.      MSC/MEDQ  D:  11/13/2004  T:  11/13/2004  Job:  [id number]   cc:   Quay Burow, M.D.  Fax: [phone]

## 2011-02-09 NOTE — Procedures (Signed)
[facility]  Patient:    Andrew [name], Andrew [name]                         MRN: [id number] Proc. Date: 04/30/01 Adm. Date:  [id number] Attending:  Rafael Bihari                           Procedure Report  PROCEDURE:  Colonoscopy.  INDICATION FOR PROCEDURE:  Rectal bleeding in a 77 year old patient with no prior colon screening.  DESCRIPTION OF PROCEDURE:  The patient was placed in the left lateral decubitus position and placed on the pulse monitor with continuous low flow oxygen delivered by nasal cannula. He was sedated with 70 mg IV Demerol and  6 mg IV Versed. The Olympus video colonoscope was inserted into the rectum and advanced to the cecum, confirmed by transillumination at McBurneys point and visualization of the ileocecal valve and appendiceal orifice. The prep was excellent. The cecum, ascending, transverse, and descending colon all appeared normal with no masses, polyps, diverticula or other mucosal abnormalities. Within the sigmoid colon were seen a few scattered diverticula and no other abnormalities. The rectum appeared normal and retroflexed view of the anus revealed small internal hemorrhoids. The colonoscope was then withdrawn and the patient returned to the recovery room in stable condition. The patient tolerated the procedure well and there were no immediate complications.  IMPRESSION: 1. Sigmoid diverticulosis. 2. Small internal hemorrhoids.  PLAN:  Treat hemorrhoids symptomatically and will proceed with flexible sigmoidoscopy in approximately five years. DD:  04/30/01 TD:  05/01/01 Job: [id number]

## 2011-02-09 NOTE — H&P (Signed)
[place]  Patient:    KYTON, BIA                         MRN: [id number] Adm. Date:  [id number] Disc. Date: [id number] Attending:  Katy Apo                         History and Physical  CHIEF COMPLAINT:  Rectal bleeding.  HISTORY OF PRESENT ILLNESS:  The patient is a 77 year old white male who was in the shower tonight and was suddenly incontinent of bright red blood per rectum with no stool. He and his wife said this was a significant amount of blood which scared them and prompted them to come directly to the emergency room. He has had no further rectal bleeding. Denies any abdominal pain or dizziness. He had visible blood on examining glove per EDP physician. He does state that he has had some dark stool for about the last week with no other complaints and also states that he has had an unexplained 65-pound weight loss in the last year, despite eating well and having no significant GI symptoms. He has never had any previous internal hemorrhage and has never had any upper or lower GI endoscopy or barium studies.  PAST MEDICAL HISTORY: 1. Borderline diabetes. 2. Seizure disorder. 3. Hypertension.  PAST SURGICAL HISTORY: 1. Left shoulder surgery, 2001. 2. Laparoscopic cholecystectomy in the past. 3. Appendectomy in the past.  MEDICATIONS:  Tegretol, Glucophage, and some other unknown seizure medicine.  ALLERGIES:  DILANTIN, PHENOBARBITAL, MECLIZINE, DEPAKOTE.  FAMILY HISTORY:  Positive for colon cancer in his mother, prostate cancer in his father. Also positive for coronary artery disease and diabetes.  SOCIAL HISTORY:  The patient is retired. He denies alcohol or tobacco use. He takes one aspirin about once week.  PHYSICAL EXAMINATION:  GENERAL:  Well-developed, well-nourished, white male in no acute distress, slightly pale.  VITAL SIGNS:  Temperature 97.3, blood pressure 147/74, pulse 74, respirations 18.  HEENT:   Unremarkable.  HEART:  Regular rate and rhythm without murmur.  LUNGS:  Clear.  ABDOMEN:  Soft, nondistended, with normoactive bowel sounds. No hepatosplenomegaly, mass, or guarding.  LABORATORY DATA:  Hemoglobin 11.7 (down from 12.7 in September 2001), WBC 8100, platelets 274,000. PT 15.1. Chemistries pending.  IMPRESSION:  Presumed lower gastrointestinal bleed, somewhat unusual presentation of incontinence of bright red blood without any sensation of passage of stool rectally.  PLAN:  We will admit at least for observation and document further stools. Will need colonoscopy eventually, especially given his mothers family history of colon cancer. DD:  04/20/01 TD:  04/20/01 Job: [id number]

## 2011-03-12 ENCOUNTER — Ambulatory Visit: Payer: Self-pay | Admitting: Gastroenterology

## 2014-04-01 ENCOUNTER — Encounter (HOSPITAL_COMMUNITY): Payer: Self-pay | Admitting: Emergency Medicine

## 2014-04-01 DIAGNOSIS — Z9049 Acquired absence of other specified parts of digestive tract: Secondary | ICD-10-CM

## 2014-04-01 DIAGNOSIS — N179 Acute kidney failure, unspecified: Secondary | ICD-10-CM | POA: Diagnosis present

## 2014-04-01 DIAGNOSIS — N183 Chronic kidney disease, stage 3 unspecified: Secondary | ICD-10-CM | POA: Diagnosis present

## 2014-04-01 DIAGNOSIS — Z87891 Personal history of nicotine dependence: Secondary | ICD-10-CM

## 2014-04-01 DIAGNOSIS — Z7982 Long term (current) use of aspirin: Secondary | ICD-10-CM

## 2014-04-01 DIAGNOSIS — E119 Type 2 diabetes mellitus without complications: Secondary | ICD-10-CM | POA: Diagnosis present

## 2014-04-01 DIAGNOSIS — Z888 Allergy status to other drugs, medicaments and biological substances status: Secondary | ICD-10-CM

## 2014-04-01 DIAGNOSIS — G40909 Epilepsy, unspecified, not intractable, without status epilepticus: Secondary | ICD-10-CM | POA: Diagnosis present

## 2014-04-01 DIAGNOSIS — D126 Benign neoplasm of colon, unspecified: Secondary | ICD-10-CM | POA: Diagnosis present

## 2014-04-01 DIAGNOSIS — Z7901 Long term (current) use of anticoagulants: Secondary | ICD-10-CM

## 2014-04-01 DIAGNOSIS — K648 Other hemorrhoids: Secondary | ICD-10-CM | POA: Diagnosis present

## 2014-04-01 DIAGNOSIS — D62 Acute posthemorrhagic anemia: Secondary | ICD-10-CM | POA: Diagnosis present

## 2014-04-01 DIAGNOSIS — K5731 Diverticulosis of large intestine without perforation or abscess with bleeding: Principal | ICD-10-CM | POA: Diagnosis present

## 2014-04-01 DIAGNOSIS — I4891 Unspecified atrial fibrillation: Secondary | ICD-10-CM | POA: Diagnosis present

## 2014-04-01 DIAGNOSIS — Z833 Family history of diabetes mellitus: Secondary | ICD-10-CM

## 2014-04-01 DIAGNOSIS — T45515A Adverse effect of anticoagulants, initial encounter: Secondary | ICD-10-CM | POA: Diagnosis present

## 2014-04-01 DIAGNOSIS — D6959 Other secondary thrombocytopenia: Secondary | ICD-10-CM | POA: Diagnosis present

## 2014-04-01 DIAGNOSIS — Z85038 Personal history of other malignant neoplasm of large intestine: Secondary | ICD-10-CM

## 2014-04-01 DIAGNOSIS — Z885 Allergy status to narcotic agent status: Secondary | ICD-10-CM

## 2014-04-01 DIAGNOSIS — I129 Hypertensive chronic kidney disease with stage 1 through stage 4 chronic kidney disease, or unspecified chronic kidney disease: Secondary | ICD-10-CM | POA: Diagnosis present

## 2014-04-01 DIAGNOSIS — Z8042 Family history of malignant neoplasm of prostate: Secondary | ICD-10-CM

## 2014-04-01 LAB — CBC WITH DIFFERENTIAL/PLATELET
BASOS PCT: 1 % (ref 0–1)
Basophils Absolute: 0 10*3/uL (ref 0.0–0.1)
EOS ABS: 0 10*3/uL (ref 0.0–0.7)
EOS PCT: 0 % (ref 0–5)
HCT: 35 % — ABNORMAL LOW (ref 39.0–52.0)
HEMOGLOBIN: 11.5 g/dL — AB (ref 13.0–17.0)
LYMPHS ABS: 2.3 10*3/uL (ref 0.7–4.0)
Lymphocytes Relative: 31 % (ref 12–46)
MCH: 30.7 pg (ref 26.0–34.0)
MCHC: 32.9 g/dL (ref 30.0–36.0)
MCV: 93.6 fL (ref 78.0–100.0)
MONOS PCT: 7 % (ref 3–12)
Monocytes Absolute: 0.5 10*3/uL (ref 0.1–1.0)
NEUTROS PCT: 61 % (ref 43–77)
Neutro Abs: 4.5 10*3/uL (ref 1.7–7.7)
Platelets: 159 10*3/uL (ref 150–400)
RBC: 3.74 MIL/uL — ABNORMAL LOW (ref 4.22–5.81)
RDW: 14.4 % (ref 11.5–15.5)
WBC: 7.3 10*3/uL (ref 4.0–10.5)

## 2014-04-01 LAB — COMPREHENSIVE METABOLIC PANEL
ALBUMIN: 4 g/dL (ref 3.5–5.2)
ALK PHOS: 78 U/L (ref 39–117)
ALT: 11 U/L (ref 0–53)
ANION GAP: 16 — AB (ref 5–15)
AST: 16 U/L (ref 0–37)
BUN: 27 mg/dL — AB (ref 6–23)
CALCIUM: 8.7 mg/dL (ref 8.4–10.5)
CO2: 24 mEq/L (ref 19–32)
CREATININE: 1.72 mg/dL — AB (ref 0.50–1.35)
Chloride: 105 mEq/L (ref 96–112)
GFR calc non Af Amer: 36 mL/min — ABNORMAL LOW (ref >90)
GFR, EST AFRICAN AMERICAN: 42 mL/min — AB (ref >90)
GLUCOSE: 125 mg/dL — AB (ref 70–99)
POTASSIUM: 4.5 meq/L (ref 3.7–5.3)
Sodium: 145 mEq/L (ref 137–147)
TOTAL PROTEIN: 7 g/dL (ref 6.0–8.3)
Total Bilirubin: 0.4 mg/dL (ref 0.3–1.2)

## 2014-04-01 LAB — PROTIME-INR
INR: 4.33 — AB (ref 0.00–1.49)
PROTHROMBIN TIME: 41.5 s — AB (ref 11.6–15.2)

## 2014-04-01 LAB — SAMPLE TO BLOOD BANK

## 2014-04-01 NOTE — ED Notes (Signed)
Pt. reports bloody stools onset this morning , denies abdominal pain , alert and oriented / respirations unlabored . Pt. stated he is taking Coumadin .

## 2014-04-02 ENCOUNTER — Inpatient Hospital Stay (HOSPITAL_COMMUNITY)
Admission: EM | Admit: 2014-04-02 | Discharge: 2014-04-06 | DRG: 378 | Disposition: A | Discharge status: Home / Self Care | Payer: Non-veteran care | Attending: Internal Medicine | Admitting: Internal Medicine

## 2014-04-02 ENCOUNTER — Encounter (HOSPITAL_COMMUNITY): Payer: Self-pay | Admitting: Internal Medicine

## 2014-04-02 DIAGNOSIS — K5731 Diverticulosis of large intestine without perforation or abscess with bleeding: Principal | ICD-10-CM

## 2014-04-02 DIAGNOSIS — R569 Unspecified convulsions: Secondary | ICD-10-CM | POA: Diagnosis present

## 2014-04-02 DIAGNOSIS — I4891 Unspecified atrial fibrillation: Secondary | ICD-10-CM

## 2014-04-02 DIAGNOSIS — D62 Acute posthemorrhagic anemia: Secondary | ICD-10-CM

## 2014-04-02 DIAGNOSIS — D696 Thrombocytopenia, unspecified: Secondary | ICD-10-CM

## 2014-04-02 DIAGNOSIS — K648 Other hemorrhoids: Secondary | ICD-10-CM

## 2014-04-02 DIAGNOSIS — N183 Chronic kidney disease, stage 3 unspecified: Secondary | ICD-10-CM

## 2014-04-02 DIAGNOSIS — K922 Gastrointestinal hemorrhage, unspecified: Secondary | ICD-10-CM | POA: Diagnosis present

## 2014-04-02 DIAGNOSIS — Z85038 Personal history of other malignant neoplasm of large intestine: Secondary | ICD-10-CM

## 2014-04-02 DIAGNOSIS — D126 Benign neoplasm of colon, unspecified: Secondary | ICD-10-CM

## 2014-04-02 DIAGNOSIS — I482 Chronic atrial fibrillation, unspecified: Secondary | ICD-10-CM

## 2014-04-02 HISTORY — DX: Cardiac arrhythmia, unspecified: I49.9

## 2014-04-02 HISTORY — DX: Unspecified convulsions: R56.9

## 2014-04-02 HISTORY — DX: Gastrointestinal hemorrhage, unspecified: K92.2

## 2014-04-02 HISTORY — DX: Malignant (primary) neoplasm, unspecified: C80.1

## 2014-04-02 HISTORY — DX: Personal history of other malignant neoplasm of large intestine: Z85.038

## 2014-04-02 HISTORY — DX: Type 2 diabetes mellitus without complications: E11.9

## 2014-04-02 HISTORY — DX: Unspecified atrial fibrillation: I48.91

## 2014-04-02 HISTORY — DX: Essential (primary) hypertension: I10

## 2014-04-02 LAB — CBC
HCT: 35.5 % — ABNORMAL LOW (ref 39.0–52.0)
HCT: 30.2 % — ABNORMAL LOW (ref 39.0–52.0)
HCT: 29.5 % — ABNORMAL LOW (ref 39.0–52.0)
HEMATOCRIT: 29.6 % — AB (ref 39.0–52.0)
HEMOGLOBIN: 9.7 g/dL — AB (ref 13.0–17.0)
Hemoglobin: 11.5 g/dL — ABNORMAL LOW (ref 13.0–17.0)
Hemoglobin: 9.8 g/dL — ABNORMAL LOW (ref 13.0–17.0)
Hemoglobin: 9.6 g/dL — ABNORMAL LOW (ref 13.0–17.0)
MCH: 30.6 pg (ref 26.0–34.0)
MCH: 30.9 pg (ref 26.0–34.0)
MCH: 30.5 pg (ref 26.0–34.0)
MCH: 30.5 pg (ref 26.0–34.0)
MCHC: 32.4 g/dL (ref 30.0–36.0)
MCHC: 32.8 g/dL (ref 30.0–36.0)
MCHC: 32.5 g/dL (ref 30.0–36.0)
MCHC: 32.5 g/dL (ref 30.0–36.0)
MCV: 94.4 fL (ref 78.0–100.0)
MCV: 94.3 fL (ref 78.0–100.0)
MCV: 94.1 fL (ref 78.0–100.0)
MCV: 93.7 fL (ref 78.0–100.0)
PLATELETS: 176 10*3/uL (ref 150–400)
PLATELETS: 138 10*3/uL — AB (ref 150–400)
Platelets: 136 10*3/uL — ABNORMAL LOW (ref 150–400)
Platelets: 130 10*3/uL — ABNORMAL LOW (ref 150–400)
RBC: 3.76 MIL/uL — ABNORMAL LOW (ref 4.22–5.81)
RBC: 3.14 MIL/uL — ABNORMAL LOW (ref 4.22–5.81)
RBC: 3.21 MIL/uL — ABNORMAL LOW (ref 4.22–5.81)
RBC: 3.15 MIL/uL — ABNORMAL LOW (ref 4.22–5.81)
RDW: 14.5 % (ref 11.5–15.5)
RDW: 14.6 % (ref 11.5–15.5)
RDW: 14.3 % (ref 11.5–15.5)
RDW: 14.4 % (ref 11.5–15.5)
WBC: 10.1 10*3/uL (ref 4.0–10.5)
WBC: 7.4 10*3/uL (ref 4.0–10.5)
WBC: 5.9 10*3/uL (ref 4.0–10.5)
WBC: 6.4 10*3/uL (ref 4.0–10.5)

## 2014-04-02 LAB — PROTIME-INR
INR: 2.49 — AB (ref 0.00–1.49)
PROTHROMBIN TIME: 26.9 s — AB (ref 11.6–15.2)

## 2014-04-02 LAB — COMPREHENSIVE METABOLIC PANEL
ALBUMIN: 3.4 g/dL — AB (ref 3.5–5.2)
ALT: 9 U/L (ref 0–53)
AST: 12 U/L (ref 0–37)
Alkaline Phosphatase: 67 U/L (ref 39–117)
Anion gap: 13 (ref 5–15)
BUN: 29 mg/dL — ABNORMAL HIGH (ref 6–23)
CHLORIDE: 109 meq/L (ref 96–112)
CO2: 22 meq/L (ref 19–32)
Calcium: 7.9 mg/dL — ABNORMAL LOW (ref 8.4–10.5)
Creatinine, Ser: 1.48 mg/dL — ABNORMAL HIGH (ref 0.50–1.35)
GFR calc Af Amer: 50 mL/min — ABNORMAL LOW (ref >90)
GFR, EST NON AFRICAN AMERICAN: 44 mL/min — AB (ref >90)
Glucose, Bld: 138 mg/dL — ABNORMAL HIGH (ref 70–99)
Potassium: 4 mEq/L (ref 3.7–5.3)
SODIUM: 144 meq/L (ref 137–147)
Total Bilirubin: 0.4 mg/dL (ref 0.3–1.2)
Total Protein: 6 g/dL (ref 6.0–8.3)

## 2014-04-02 LAB — GLUCOSE, CAPILLARY
Glucose-Capillary: 118 mg/dL — ABNORMAL HIGH (ref 70–99)
Glucose-Capillary: 137 mg/dL — ABNORMAL HIGH (ref 70–99)
Glucose-Capillary: 145 mg/dL — ABNORMAL HIGH (ref 70–99)
Glucose-Capillary: 152 mg/dL — ABNORMAL HIGH (ref 70–99)
Glucose-Capillary: 213 mg/dL — ABNORMAL HIGH (ref 70–99)

## 2014-04-02 LAB — POC OCCULT BLOOD, ED: FECAL OCCULT BLD: POSITIVE — AB

## 2014-04-02 MED ORDER — SODIUM CHLORIDE 0.9 % IV SOLN
INTRAVENOUS | Status: AC
  Administered 2014-04-02 (×2): via INTRAVENOUS

## 2014-04-02 MED ORDER — METOPROLOL SUCCINATE 12.5 MG HALF TABLET
12.5000 mg | ORAL_TABLET | Freq: Every evening | ORAL | Status: DC
  Administered 2014-04-02 – 2014-04-05 (×4): 12.5 mg via ORAL
  Filled 2014-04-02 (×5): qty 1

## 2014-04-02 MED ORDER — ZONISAMIDE 100 MG PO CAPS
100.0000 mg | ORAL_CAPSULE | Freq: Two times a day (BID) | ORAL | Status: DC
  Administered 2014-04-02 – 2014-04-06 (×9): 100 mg via ORAL
  Filled 2014-04-02 (×11): qty 1

## 2014-04-02 MED ORDER — ONDANSETRON HCL 4 MG/2ML IJ SOLN: 4.0000 mg | Freq: Four times a day (QID) | INTRAMUSCULAR | Status: DC | PRN

## 2014-04-02 MED ORDER — CARBAMAZEPINE ER 300 MG PO CP12: 300.0000 mg | ORAL_CAPSULE | Freq: Two times a day (BID) | ORAL | Status: DC

## 2014-04-02 MED ORDER — VITAMIN K1 10 MG/ML IJ SOLN
5.0000 mg | Freq: Once | INTRAVENOUS | Status: AC
  Administered 2014-04-02: 5 mg via INTRAVENOUS
  Filled 2014-04-02: qty 0.5

## 2014-04-02 MED ORDER — ONDANSETRON HCL 4 MG PO TABS: 4.0000 mg | ORAL_TABLET | Freq: Four times a day (QID) | ORAL | Status: DC | PRN

## 2014-04-02 MED ORDER — HYDROXYZINE HCL 25 MG PO TABS
25.0000 mg | ORAL_TABLET | Freq: Every day | ORAL | Status: DC
  Administered 2014-04-02 – 2014-04-05 (×4): 25 mg via ORAL
  Filled 2014-04-02 (×7): qty 1

## 2014-04-02 MED ORDER — ACETAMINOPHEN 650 MG RE SUPP: 650.0000 mg | Freq: Four times a day (QID) | RECTAL | Status: DC | PRN

## 2014-04-02 MED ORDER — HYDROCORTISONE 2.5 % EX CREA: 1.0000 "application " | TOPICAL_CREAM | CUTANEOUS | Status: DC | PRN

## 2014-04-02 MED ORDER — HYDROCORTISONE 1 % EX CREA
TOPICAL_CREAM | CUTANEOUS | Status: DC | PRN
  Filled 2014-04-02: qty 28

## 2014-04-02 MED ORDER — ACETAMINOPHEN 325 MG PO TABS: 650.0000 mg | ORAL_TABLET | Freq: Four times a day (QID) | ORAL | Status: DC | PRN

## 2014-04-02 MED ORDER — BIOTENE DRY MOUTH MT LIQD
15.0000 mL | Freq: Two times a day (BID) | OROMUCOSAL | Status: DC
  Administered 2014-04-02 – 2014-04-06 (×9): 15 mL via OROMUCOSAL

## 2014-04-02 MED ORDER — PANTOPRAZOLE SODIUM 40 MG IV SOLR
40.0000 mg | Freq: Two times a day (BID) | INTRAVENOUS | Status: DC
  Filled 2014-04-02 (×2): qty 40

## 2014-04-02 MED ORDER — INSULIN ASPART 100 UNIT/ML ~~LOC~~ SOLN
0.0000 [IU] | Freq: Three times a day (TID) | SUBCUTANEOUS | Status: DC
  Administered 2014-04-02: 3 [IU] via SUBCUTANEOUS
  Administered 2014-04-02: 2 [IU] via SUBCUTANEOUS
  Administered 2014-04-02: 1 [IU] via SUBCUTANEOUS
  Administered 2014-04-03: 2 [IU] via SUBCUTANEOUS
  Administered 2014-04-03: 1 [IU] via SUBCUTANEOUS
  Administered 2014-04-04: 2 [IU] via SUBCUTANEOUS
  Administered 2014-04-04 – 2014-04-05 (×2): 1 [IU] via SUBCUTANEOUS
  Administered 2014-04-05: 2 [IU] via SUBCUTANEOUS
  Administered 2014-04-06: 3 [IU] via SUBCUTANEOUS
  Administered 2014-04-06: 2 [IU] via SUBCUTANEOUS

## 2014-04-02 MED ORDER — PANTOPRAZOLE SODIUM 40 MG PO TBEC
40.0000 mg | DELAYED_RELEASE_TABLET | Freq: Two times a day (BID) | ORAL | Status: DC
  Administered 2014-04-02 – 2014-04-06 (×9): 40 mg via ORAL
  Filled 2014-04-02 (×8): qty 1

## 2014-04-02 MED ORDER — CARBAMAZEPINE ER 200 MG PO TB12
300.0000 mg | ORAL_TABLET | Freq: Two times a day (BID) | ORAL | Status: DC
  Administered 2014-04-02 – 2014-04-06 (×9): 300 mg via ORAL
  Filled 2014-04-02 (×10): qty 1

## 2014-04-02 NOTE — Progress Notes (Signed)
Utilization review completed.  

## 2014-04-02 NOTE — ED Notes (Signed)
Dr Harrison at bedside

## 2014-04-02 NOTE — ED Provider Notes (Signed)
CSN: [id number]     Arrival date & time 04/01/14  1937 History   First MD Initiated Contact with Patient 04/02/14 0041     Chief Complaint  Patient presents with  . Rectal Bleeding     (Consider location/radiation/quality/duration/timing/severity/associated sxs/prior Treatment) Patient is a 80 y.o. male presenting with hematochezia. The history is provided by the patient.  Rectal Bleeding Quality:  Bright red Amount:  Moderate Duration:  1 day Timing:  Intermittent Progression:  Unchanged Chronicity:  New Context: spontaneously   Similar prior episodes: yes   Relieved by:  Nothing Worsened by:  Nothing tried Ineffective treatments:  None tried Associated symptoms: no abdominal pain, no fever and no vomiting   Risk factors: anticoagulant use     Past Medical History  Diagnosis Date  . Cancer   . Hypertension   . Diabetes mellitus without complication   . Seizures    Past Surgical History  Procedure Laterality Date  . Colon surgery     No family history on file. History  Substance Use Topics  . Smoking status: Never Smoker   . Smokeless tobacco: Not on file  . Alcohol Use: No    Review of Systems  Constitutional: Negative for fever.  HENT: Negative for drooling and rhinorrhea.   Eyes: Negative for pain.  Respiratory: Negative for cough and shortness of breath.   Cardiovascular: Negative for chest pain and leg swelling.  Gastrointestinal: Positive for hematochezia. Negative for nausea, vomiting, abdominal pain and diarrhea.  Genitourinary: Negative for dysuria and hematuria.       Bloody stools  Musculoskeletal: Negative for gait problem and neck pain.  Skin: Negative for color change.  Neurological: Negative for numbness and headaches.  Hematological: Negative for adenopathy.  Psychiatric/Behavioral: Negative for behavioral problems.  All other systems reviewed and are negative.     Allergies  Depakote; Dilantin; and Phenobarbital  Home Medications    Prior to Admission medications   Not on File   BP 117/67  Pulse 79  Temp(Src) 98.4 F (36.9 C) (Oral)  Resp 16  SpO2 99% Physical Exam  Nursing note and vitals reviewed. Constitutional: He is oriented to person, place, and time. He appears well-developed and well-nourished.  HENT:  Head: Normocephalic and atraumatic.  Right Ear: External ear normal.  Left Ear: External ear normal.  Nose: Nose normal.  Mouth/Throat: Oropharynx is clear and moist. No oropharyngeal exudate.  Eyes: Conjunctivae and EOM are normal. Pupils are equal, round, and reactive to light.  Neck: Normal range of motion. Neck supple.  Cardiovascular: Normal rate, regular rhythm, normal heart sounds and intact distal pulses.  Exam reveals no gallop and no friction rub.   No murmur heard. Pulmonary/Chest: Effort normal and breath sounds normal. No respiratory distress. He has no wheezes.  Abdominal: Soft. Bowel sounds are normal. He exhibits no distension. There is no tenderness. There is no rebound and no guarding.  Large abd wall hernia in LLQ.   Genitourinary:  Normal appearing external rectum with maroon colored stool.  Musculoskeletal: Normal range of motion. He exhibits no edema and no tenderness.  Mild bruising to right lateral hip.   Neurological: He is alert and oriented to person, place, and time.  Skin: Skin is warm and dry.  Psychiatric: He has a normal mood and affect. His behavior is normal.    ED Course  Procedures (including critical care time) Labs Review Labs Reviewed  CBC WITH DIFFERENTIAL - Abnormal; Notable for the following:    RBC 3.74 (*)  Hemoglobin 11.5 (*)    HCT 35.0 (*)    All other components within normal limits  COMPREHENSIVE METABOLIC PANEL - Abnormal; Notable for the following:    Glucose, Bld 125 (*)    BUN 27 (*)    Creatinine, Ser 1.72 (*)    GFR calc non Af Amer 36 (*)    GFR calc Af Amer 42 (*)    Anion gap 16 (*)    All other components within normal limits   PROTIME-INR - Abnormal; Notable for the following:    Prothrombin Time 41.5 (*)    INR 4.33 (*)    All other components within normal limits  SAMPLE TO BLOOD BANK    Imaging Review No results found.   EKG Interpretation None      MDM   Final diagnoses:  Gastrointestinal hemorrhage, unspecified gastritis, unspecified gastrointestinal hemorrhage type    12:41 AM 80 y.o. male w hx of colon cancer s/p resection, HTN, DM who pw bloody stools. Pt reports normal colonoscopy 4 years ago. He states that he had a bloody stool yesterday morning and then again this afternoon. Since she's been here he has had 3 bloody stools. He is afebrile and vital signs are unremarkable. Will get screening labs.  Will admit d/t pt being on coumadin w/ inc frequency of bloody stools. Will order FFP and vit K to reverse coumadin per hospitalist recommendation.     Blanchard Kelch, MD 04/03/14 [phone]

## 2014-04-02 NOTE — ED Notes (Signed)
Pt ambulatory unassisted to restroom with steady gait

## 2014-04-02 NOTE — Progress Notes (Signed)
Patient seen and examined. Admitted after midnight secondary to BRBPR. Hemodynamically stable. Denies CP, SOB, nausea, vomiting, abdominal pain or any other complaints. He is on coumadin for chronic Atrial fibrillation and on admission INR was supra-therapeutic. For more info/details on admission referred to Dr. Moise Boring H&P.  Plan: -most likely lower GI bleed (AVM, diverticulosis and/or hemorrhoids) -will allow for full liquid diet -continue PO PPI -INR reversed, now 2.4 and expected to be lower in am (7/11) -will follow Hgb trend; if less than 8, will transfuse. (currently 9.7, 7/10) -follow clinical response and base of that decide if GI needs to see him or not.   Barton Dubois [id number]

## 2014-04-02 NOTE — H&P (Signed)
[facility] History and Physical  Andrew [name] [id number] DOB: May 22, 1934 DOA: 04/02/2014  Referring physician: ER physician. PCP: [facility]. Chief Complaint: Rectal bleeding.  HPI: Andrew [name] is a 80 y.o. male with history of colon cancer status post colectomy 15-20 years ago as per the patient, atrial fibrillation on Coumadin, Seizure and diabetes mellitus presents to the ER because of rectal bleeding. Patient states that he started noticing rectal bleeding yesterday morning at 3 AM and had another episode last evening 4 PM and he decided to come to the ER. In the ER he had another 3 episodes of rectal bleeding which was frankly bloody. Denies any abdominal pain nausea vomiting. Denies using any recent antibiotics or NSAIDs. He is on Coumadin for atrial fibrillation INR is supratherapeutic. Patient's hemoglobin was found to be around 11 and patient is hemodynamically stable and will be admitted for further management of his rectal bleeding. Denies any chest pain or shortness of breath.   Review of Systems: As presented in the history of presenting illness, rest negative.  Past Medical History  Diagnosis Date  . Cancer   . Hypertension   . Diabetes mellitus without complication   . Seizures   . Dysrhythmia    Past Surgical History  Procedure Laterality Date  . Colon surgery     Social History:  reports that he has never smoked. He does not have any smokeless tobacco history on file. He reports that he does not drink alcohol or use illicit drugs. Where does patient live home. Can patient participate in ADLs? Yes head  Allergies  Allergen Reactions  . Depakote [Divalproex Sodium] Rash  . Dilantin [Phenytoin Sodium Extended] Rash  . Phenobarbital Rash    Family History:  Family History  Problem Relation Age of Onset  . Diabetes Mellitus II Mother   . Prostate cancer Father   . Diabetes Mellitus II Brother       Prior to Admission medications    Medication Sig Start Date End Date Taking? Authorizing Provider  aspirin EC 81 MG tablet Take 81 mg by mouth 2 (two) times daily.   Yes Historical Provider, MD  carbamazepine (CARBATROL) 300 MG 12 hr capsule Take 300 mg by mouth 2 (two) times daily.   Yes Historical Provider, MD  hydrocortisone 2.5 % cream Apply 1 application topically every hour as needed (itching).   Yes Historical Provider, MD  hydrOXYzine (ATARAX/VISTARIL) 25 MG tablet Take 25 mg by mouth at bedtime.   Yes Historical Provider, MD  metoprolol succinate (TOPROL-XL) 25 MG 24 hr tablet Take 12.5 mg by mouth every evening.   Yes Historical Provider, MD  PRESCRIPTION MEDICATION Place 1 drop into the left eye every evening.   Yes Historical Provider, MD  zonisamide (ZONEGRAN) 100 MG capsule Take 100 mg by mouth 2 (two) times daily.   Yes Historical Provider, MD    Physical Exam: Filed Vitals:   04/02/14 0209 04/02/14 0230 04/02/14 0248 04/02/14 0344  BP: 98/57 113/76 112/59 117/89  Pulse: 84 94  93  Temp: 98 F (36.7 C)  98.4 F (36.9 C) 97.8 F (36.6 C)  TempSrc: Oral   Oral  Resp: 18 18  18   Height:    5\' 9"  (1.753 m)  Weight:    117.073 kg (258 lb 1.6 oz)  SpO2: 96% 97%  97%     General:  Well-developed well-nourished.  Eyes: Anicteric no pallor.  ENT: No discharge from the ears eyes  nose mouth.  Neck: No mass felt. No neck rigidity.  Cardiovascular: S1-S2 heard.  Respiratory: No rhonchi or crepitation.  Abdomen: Soft nontender bowel sounds present no guarding or rigidity.  Skin: No rash.  Musculoskeletal: No edema.  Psychiatric: Appears normal.  Neurologic: Alert and oriented to time place and person. Moves all extremities.  Labs on Admission:  Basic Metabolic Panel:  Recent Labs Lab 04/01/14 2000  NA 145  K 4.5  CL 105  CO2 24  GLUCOSE 125*  BUN 27*  CREATININE 1.72*  CALCIUM 8.7   Liver Function Tests:  Recent Labs Lab 04/01/14 2000  AST 16  ALT 11  ALKPHOS 78  BILITOT 0.4   PROT 7.0  ALBUMIN 4.0   No results found for this basename: LIPASE, AMYLASE,  in the last 168 hours No results found for this basename: AMMONIA,  in the last 168 hours CBC:  Recent Labs Lab 04/01/14 2000 04/02/14 0150  WBC 7.3 10.1  NEUTROABS 4.5  --   HGB 11.5* 11.5*  HCT 35.0* 35.5*  MCV 93.6 94.4  PLT 159 176   Cardiac Enzymes: No results found for this basename: CKTOTAL, CKMB, CKMBINDEX, TROPONINI,  in the last 168 hours  BNP (last 3 results) No results found for this basename: PROBNP,  in the last 8760 hours CBG: No results found for this basename: GLUCAP,  in the last 168 hours  Radiological Exams on Admission: No results found.   Assessment/Plan Principal Problem:   GI bleed Active Problems:   Atrial fibrillation   History of colon cancer   Seizure   1. Rectal bleeding with history of colon cancer - suspect patient waiting to be lower GI. We'll keep patient n.p.o. except medications. Hold Coumadin and reverse anticoagulation. Keep patient on Protonix. Consult GI in a.m. Follow CBC and INR. 2. Coagulopathy secondary to Coumadin - since patient has GI bleed Coumadin is on hold and patient has received 2 units of FFP and vitamin K. Follow CBC and INR. 3. Anemia probably from GI bleed - follow CBC. 4. History of seizures - continue present medications. 5. Atrial fibrillation - rate controlled continue rate limiting medications but hold Coumadin see #2. 6. Diabetes mellitus - follow CBGs with sliding-scale coverage. Patient usually takes metformin which will be placed on hold.    Code Status: Full code.  Family Communication: None.  Disposition Plan: Admit to inpatient.    Sydnei Ohaver N. [facility] Pager [phone].  If 7PM-7AM, please contact night-coverage [email or web address] Password [email or web address] 04/02/2014, 4:55 AM

## 2014-04-03 DIAGNOSIS — N183 Chronic kidney disease, stage 3 unspecified: Secondary | ICD-10-CM

## 2014-04-03 DIAGNOSIS — D62 Acute posthemorrhagic anemia: Secondary | ICD-10-CM

## 2014-04-03 HISTORY — DX: Chronic kidney disease, stage 3 unspecified: N18.30

## 2014-04-03 LAB — CBC
HCT: 27.6 % — ABNORMAL LOW (ref 39.0–52.0)
Hemoglobin: 9.1 g/dL — ABNORMAL LOW (ref 13.0–17.0)
MCH: 30.7 pg (ref 26.0–34.0)
MCHC: 33 g/dL (ref 30.0–36.0)
MCV: 93.2 fL (ref 78.0–100.0)
PLATELETS: 126 10*3/uL — AB (ref 150–400)
RBC: 2.96 MIL/uL — ABNORMAL LOW (ref 4.22–5.81)
RDW: 14.5 % (ref 11.5–15.5)
WBC: 6.8 10*3/uL (ref 4.0–10.5)

## 2014-04-03 LAB — GLUCOSE, CAPILLARY
GLUCOSE-CAPILLARY: 131 mg/dL — AB (ref 70–99)
Glucose-Capillary: 127 mg/dL — ABNORMAL HIGH (ref 70–99)
Glucose-Capillary: 153 mg/dL — ABNORMAL HIGH (ref 70–99)
Glucose-Capillary: 106 mg/dL — ABNORMAL HIGH (ref 70–99)
Glucose-Capillary: 160 mg/dL — ABNORMAL HIGH (ref 70–99)

## 2014-04-03 LAB — PREPARE FRESH FROZEN PLASMA
Unit division: 0
Unit division: 0

## 2014-04-03 LAB — PROTIME-INR
INR: 1.37 (ref 0.00–1.49)
Prothrombin Time: 16.9 seconds — ABNORMAL HIGH (ref 11.6–15.2)

## 2014-04-03 NOTE — Progress Notes (Signed)
Pt reports just having a dark red stool about the size of a thumb, "almost black like tar".

## 2014-04-03 NOTE — Progress Notes (Addendum)
[facility] PROGRESS NOTE  Andrew [name] [id number] DOB: [birth year] DOA: 04/02/2014 PCP: Pcp Not In System  Assessment/Plan  Rectal bleeding history of colon cancer. Differential diagnosis includes AVM, diverticulosis, recurrent colon cancer.  Sees GI in [place], last colonoscopy about 6 years ago.  Never previously told about diverticulosis.  -  Followup INR -  Hemoglobin drifting down slightly this morning -  Spoke with Dr. Collene Mares who recommend observation and if no further bleeding in next 24 hours, okay to d/c to follow up with primary gastroenterologist -  Continue PPI  Coagulopathy secondary to Coumadin, patient received 2 units of FFP and vitamin K -  Followup INR -  Daily CBC  A-fib, rate controlled -  Continue metoprolol -  Coumadin on hold due to GIB  Acute blood loss anemia, hemoglobin drifting down slightly  -  transfuse for hemoglobin less than 7 or symptomatic anemia -   repeat CBC in a.m.  Thrombocytopenia, likely due to consumption from GIB -  Repeat in AM  Seizure disorder, seizure-free, continue carbamazepine and the zonisamide  T2DM -  Hold oral medications -  SSI  CKD -  Repeat BMP in AM -  Renally dose medication -  Avoid nephrotoxins  Falls at home -  PT evaluation  Diet:  Continue full liquid Access:  PIV IVF:  off Proph:  SCD  Code Status: full Family Communication: patient alone Disposition Plan:  Monitor for further bleeding, possible d/c tomorrow if hgb still stable   Consultants:  GI by phone, spoke with Dr. Collene Mares  Procedures:  None  Antibiotics:  None   HPI/Subjective:  Last BM was around 9pm last night and was a small amount of dark red blood, no stool.  Denies nausea, vomiting, abdominal pain  Objective: Filed Vitals:   04/02/14 1351 04/02/14 1739 04/02/14 2310 04/03/14 0423  BP: 139/80 140/76 134/63 114/74  Pulse: 89 80 72 76  Temp: 97.7 F (36.5 C)  98.2 F (36.8 C) 97.9 F (36.6 C)  TempSrc: Oral   Oral Oral  Resp: 20  18 16   Height:      Weight:      SpO2: 100%  100% 96%    Intake/Output Summary (Last 24 hours) at 04/03/14 1006 Last data filed at 04/03/14 0900  Gross per 24 hour  Intake   1198 ml  Output      0 ml  Net   1198 ml   Filed Weights   04/02/14 0344  Weight: 117.073 kg (258 lb 1.6 oz)    Exam:   General:  CM, No acute distress  HEENT:  NCAT, MMM  Cardiovascular:  Nl S1, S2 no mrg, 2+ pulses, warm extremities  Respiratory:  CTAB, no increased WOB  Abdomen:   NABS, soft, NT/ND, large hernia left lower abdomen  MSK:   Normal tone and bulk, no LEE  Neuro:  Grossly intact  Skin:  Ecchymosis right lateral buttock/hip  Data Reviewed: Basic Metabolic Panel:  Recent Labs Lab 04/01/14 2000 04/02/14 0545  NA 145 144  K 4.5 4.0  CL 105 109  CO2 24 22  GLUCOSE 125* 138*  BUN 27* 29*  CREATININE 1.72* 1.48*  CALCIUM 8.7 7.9*   Liver Function Tests:  Recent Labs Lab 04/01/14 2000 04/02/14 0545  AST 16 12  ALT 11 9  ALKPHOS 78 67  BILITOT 0.4 0.4  PROT 7.0 6.0  ALBUMIN 4.0 3.4*   No results found for this basename: LIPASE, AMYLASE,  in the  last 168 hours No results found for this basename: AMMONIA,  in the last 168 hours CBC:  Recent Labs Lab 04/01/14 2000 04/02/14 0150 04/02/14 0545 04/02/14 1035 04/02/14 1820 04/03/14 0510  WBC 7.3 10.1 7.4 5.9 6.4 6.8  NEUTROABS 4.5  --   --   --   --   --   HGB 11.5* 11.5* 9.7* 9.8* 9.6* 9.1*  HCT 35.0* 35.5* 29.6* 30.2* 29.5* 27.6*  MCV 93.6 94.4 94.3 94.1 93.7 93.2  PLT 159 176 136* 130* 138* 126*   Cardiac Enzymes: No results found for this basename: CKTOTAL, CKMB, CKMBINDEX, TROPONINI,  in the last 168 hours BNP (last 3 results) No results found for this basename: PROBNP,  in the last 8760 hours CBG:  Recent Labs Lab 04/02/14 1653 04/02/14 2003 04/02/14 2335 04/03/14 0406 04/03/14 0818  GLUCAP 137* 145* 118* 127* 131*    No results found for this or any previous visit  (from the past 240 hour(s)).   Studies: No results found.  Scheduled Meds: . antiseptic oral rinse  15 mL Mouth Rinse BID  . carbamazepine  300 mg Oral BID  . hydrOXYzine  25 mg Oral QHS  . insulin aspart  0-9 Units Subcutaneous TID WC  . metoprolol succinate  12.5 mg Oral QPM  . pantoprazole  40 mg Oral BID  . zonisamide  100 mg Oral BID   Continuous Infusions:   Principal Problem:   GI bleed Active Problems:   Atrial fibrillation   History of colon cancer   Seizure    Time spent: 30 min    Pratyush Ammon, [facility] Pager [phone]. If 7PM-7AM, please contact night-coverage at [email or web address], password [email or web address] 04/03/2014, 10:06 AM  LOS: 1 day

## 2014-04-04 LAB — CBC
HEMATOCRIT: 29.5 % — AB (ref 39.0–52.0)
HEMOGLOBIN: 9.6 g/dL — AB (ref 13.0–17.0)
MCH: 30.7 pg (ref 26.0–34.0)
MCHC: 32.5 g/dL (ref 30.0–36.0)
MCV: 94.2 fL (ref 78.0–100.0)
Platelets: 152 10*3/uL (ref 150–400)
RBC: 3.13 MIL/uL — ABNORMAL LOW (ref 4.22–5.81)
RDW: 14.4 % (ref 11.5–15.5)
WBC: 6.1 10*3/uL (ref 4.0–10.5)

## 2014-04-04 LAB — GLUCOSE, CAPILLARY
GLUCOSE-CAPILLARY: 113 mg/dL — AB (ref 70–99)
GLUCOSE-CAPILLARY: 131 mg/dL — AB (ref 70–99)
Glucose-Capillary: 130 mg/dL — ABNORMAL HIGH (ref 70–99)
Glucose-Capillary: 137 mg/dL — ABNORMAL HIGH (ref 70–99)
Glucose-Capillary: 134 mg/dL — ABNORMAL HIGH (ref 70–99)

## 2014-04-04 LAB — BASIC METABOLIC PANEL
Anion gap: 14 (ref 5–15)
BUN: 18 mg/dL (ref 6–23)
CALCIUM: 9 mg/dL (ref 8.4–10.5)
CHLORIDE: 107 meq/L (ref 96–112)
CO2: 24 meq/L (ref 19–32)
CREATININE: 1.3 mg/dL (ref 0.50–1.35)
GFR calc non Af Amer: 51 mL/min — ABNORMAL LOW (ref >90)
GFR, EST AFRICAN AMERICAN: 59 mL/min — AB (ref >90)
Glucose, Bld: 202 mg/dL — ABNORMAL HIGH (ref 70–99)
Potassium: 4.5 mEq/L (ref 3.7–5.3)
SODIUM: 145 meq/L (ref 137–147)

## 2014-04-04 LAB — PROTIME-INR
INR: 1.26 (ref 0.00–1.49)
PROTHROMBIN TIME: 15.8 s — AB (ref 11.6–15.2)

## 2014-04-04 MED ORDER — PEG 3350-KCL-NA BICARB-NACL 420 G PO SOLR
4000.0000 mL | Freq: Once | ORAL | Status: AC
  Administered 2014-04-04: 4000 mL via ORAL
  Filled 2014-04-04: qty 4000

## 2014-04-04 NOTE — Evaluation (Signed)
Physical Therapy Evaluation Patient Details Name: Andrew [name] MRN: [id number] DOB: [birth year] Today's Date: 04/04/2014   History of Present Illness  Patient is a 80 yo male admitted 04/02/14 with rectal bleeding - poss GIB, and anemia.  Patient with h/o colon CA, Afib, seizures, DM, HTN, and multiple falls.  Clinical Impression  Patient presents with problems listed below.  Will benefit from acute PT to maximize independence prior to discharge home with wife.  Patient with decreased balance and multiple falls at home.  Recommend patient use rollator (with seat due to fatgue) and f/u HHPT for continued therapy for balance.    Follow Up Recommendations Home health PT;Supervision/Assistance - 24 hour    Equipment Recommendations  Other (comment) (Rollator (4-wheeled RW))    Recommendations for Other Services       Precautions / Restrictions Precautions Precautions: Fall Precaution Comments: Patient reports 8 falls in last 6 months.  Using cane during falls. Restrictions Weight Bearing Restrictions: No      Mobility  Bed Mobility                  Transfers Overall transfer level: Needs assistance Equipment used: Rolling walker (2 wheeled) Transfers: Sit to/from Stand Sit to Stand: Min guard         General transfer comment: Verbal cues for hand placement and safety.  Assist for balance/safety.  Ambulation/Gait Ambulation/Gait assistance: Min guard Ambulation Distance (Feet): 200 Feet Assistive device: Rolling walker (2 wheeled) Gait Pattern/deviations: Step-through pattern;Decreased stride length;Trunk flexed Gait velocity: Decreased Gait velocity interpretation: Below normal speed for age/gender General Gait Details: Attempted ambulation without AD - loss of balance requiring min-mod assist for safety.  Verbal cues for safe use of RW.  Difficulty maneuvering RW during turns.  Cues to keep feet inside RW.  Patient fatigues quickly, requiring 2 standing rest breaks  during gait.  Balance improved with RW.  Stairs            Wheelchair Mobility    Modified Rankin (Stroke Patients Only)       Balance Overall balance assessment: Needs assistance         Standing balance support: Single extremity supported;During functional activity Standing balance-Leahy Scale: Poor Standing balance comment: Loss of balance with dynamic activity with one hand supported.  Improved balance with BUE's supported on RW.                             Pertinent Vitals/Pain     Home Living Family/patient expects to be discharged to:: Private residence Living Arrangements: Spouse/significant other Available Help at Discharge: Family;Available 24 hours/day (wife uses RW/cane) Type of Home: House Home Access: Stairs to enter Entrance Stairs-Rails: Right Entrance Stairs-Number of Steps: 5 Home Layout: Multi-level Home Equipment: Cane - single point;Shower seat      Prior Function Level of Independence: Independent with assistive device(s)               Hand Dominance        Extremity/Trunk Assessment   Upper Extremity Assessment: Overall WFL for tasks assessed           Lower Extremity Assessment: Generalized weakness         Communication   Communication: No difficulties  Cognition Arousal/Alertness: Awake/alert Behavior During Therapy: WFL for tasks assessed/performed Overall Cognitive Status: Within Functional Limits for tasks assessed (decreased safety awareness)  General Comments      Exercises        Assessment/Plan    PT Assessment Patient needs continued PT services  PT Diagnosis Difficulty walking;Abnormality of gait;Generalized weakness   PT Problem List Decreased strength;Decreased activity tolerance;Decreased balance;Decreased mobility;Decreased knowledge of use of DME;Decreased safety awareness  PT Treatment Interventions DME instruction;Gait training;Stair  training;Functional mobility training;Balance training;Patient/family education   PT Goals (Current goals can be found in the Care Plan section) Acute Rehab PT Goals Patient Stated Goal: To return home PT Goal Formulation: With patient Time For Goal Achievement: 04/11/14 Potential to Achieve Goals: Good    Frequency Min 3X/week   Barriers to discharge        Co-evaluation               End of Session Equipment Utilized During Treatment: Gait belt Activity Tolerance: Patient limited by fatigue Patient left: in bed;with call bell/phone within reach (sitting EOB) Nurse Communication: Mobility status         Time: 0950-1006 PT Time Calculation (min): 16 min   Charges:   PT Evaluation $Initial PT Evaluation Tier I: 1 Procedure PT Treatments $Gait Training: 8-22 mins   PT G Codes:          Despina Pole 04/04/2014, 11:10 AM Carita Pian. Sanjuana Kava, [place] Pager [phone]

## 2014-04-04 NOTE — Consult Note (Signed)
Cross cover [facility] Reason for Consult: Rectal bleeding. Referring Physician: THP.  Andrew [name] is an 80 y.o. male.  HPI: 80 year old white male, with multiple medical issues listed below, gives a history of having a partial colectomy for colon cancer ?15-20 years ago. He claims he has a gastroenterologist in [place] but cannot remember his name. His last colonoscopy, reportedly was about 5-6 ago when he was found to have diverticulosis. He had several episodes of rectal bleeding starting early in the morning on 07/10./15 and again on 04/03/14. He has not had any further bleeding today. He denies having any abdominal pain, nausea or vomiting, diarrhea or constipation.  He is on Coumadin for chronic atrial fibrillation.   Past Medical History  Diagnosis Date  . Colon cancer   . Hypertension   . Diabetes mellitus without complication   . Seizures   . Dysrhythmia   . GI bleed 04/02/2014   Past Surgical History  Procedure Laterality Date  . Colon surgery    . Appendectomy    . Clavicle surgery Right 1954   Family History  Problem Relation Age of Onset  . Diabetes Mellitus II Mother   . Prostate cancer Father   . Diabetes Mellitus II Brother    Social History:  reports that he quit smoking about 44 years ago. He has never used smokeless tobacco. He reports that he does not drink alcohol or use illicit drugs.  Allergies:  Allergies  Allergen Reactions  . Depakote [Divalproex Sodium] Rash  . Dilantin [Phenytoin Sodium Extended] Rash  . Phenobarbital Rash   Medications: I have reviewed the patient's current medications.  Results for orders placed during the hospital encounter of 04/02/14 (from the past 48 hour(s))  GLUCOSE, CAPILLARY     Status: Abnormal   Collection Time    04/02/14  4:53 PM      Result Value Ref Range   Glucose-Capillary 137 (*) 70 - 99 mg/dL  CBC     Status: Abnormal   Collection Time    04/02/14  6:20 PM      Result Value Ref Range   WBC 6.4  4.0 - 10.5  K/uL   RBC 3.15 (*) 4.22 - 5.81 MIL/uL   Hemoglobin 9.6 (*) 13.0 - 17.0 g/dL   HCT 29.5 (*) 39.0 - 52.0 %   MCV 93.7  78.0 - 100.0 fL   MCH 30.5  26.0 - 34.0 pg   MCHC 32.5  30.0 - 36.0 g/dL   RDW 14.4  11.5 - 15.5 %   Platelets 138 (*) 150 - 400 K/uL  GLUCOSE, CAPILLARY     Status: Abnormal   Collection Time    04/02/14  8:03 PM      Result Value Ref Range   Glucose-Capillary 145 (*) 70 - 99 mg/dL  GLUCOSE, CAPILLARY     Status: Abnormal   Collection Time    04/02/14 11:35 PM      Result Value Ref Range   Glucose-Capillary 118 (*) 70 - 99 mg/dL   Comment 1 Notify RN     Comment 2 Documented in Chart    GLUCOSE, CAPILLARY     Status: Abnormal   Collection Time    04/03/14  4:06 AM      Result Value Ref Range   Glucose-Capillary 127 (*) 70 - 99 mg/dL  CBC     Status: Abnormal   Collection Time    04/03/14  5:10 AM      Result  Value Ref Range   WBC 6.8  4.0 - 10.5 K/uL   RBC 2.96 (*) 4.22 - 5.81 MIL/uL   Hemoglobin 9.1 (*) 13.0 - 17.0 g/dL   HCT 27.6 (*) 39.0 - 52.0 %   MCV 93.2  78.0 - 100.0 fL   MCH 30.7  26.0 - 34.0 pg   MCHC 33.0  30.0 - 36.0 g/dL   RDW 14.5  11.5 - 15.5 %   Platelets 126 (*) 150 - 400 K/uL  GLUCOSE, CAPILLARY     Status: Abnormal   Collection Time    04/03/14  8:18 AM      Result Value Ref Range   Glucose-Capillary 131 (*) 70 - 99 mg/dL  PROTIME-INR     Status: Abnormal   Collection Time    04/03/14 11:20 AM      Result Value Ref Range   Prothrombin Time 16.9 (*) 11.6 - 15.2 seconds   INR 1.37  0.00 - 1.49  GLUCOSE, CAPILLARY     Status: Abnormal   Collection Time    04/03/14 12:12 PM      Result Value Ref Range   Glucose-Capillary 153 (*) 70 - 99 mg/dL  GLUCOSE, CAPILLARY     Status: Abnormal   Collection Time    04/03/14  5:22 PM      Result Value Ref Range   Glucose-Capillary 106 (*) 70 - 99 mg/dL  GLUCOSE, CAPILLARY     Status: Abnormal   Collection Time    04/03/14  8:10 PM      Result Value Ref Range   Glucose-Capillary 160 (*)  70 - 99 mg/dL   Comment 1 Notify RN    GLUCOSE, CAPILLARY     Status: Abnormal   Collection Time    04/04/14 12:26 AM      Result Value Ref Range   Glucose-Capillary 113 (*) 70 - 99 mg/dL   Comment 1 Notify RN    GLUCOSE, CAPILLARY     Status: Abnormal   Collection Time    04/04/14  4:13 AM      Result Value Ref Range   Glucose-Capillary 130 (*) 70 - 99 mg/dL   Comment 1 Notify RN    GLUCOSE, CAPILLARY     Status: Abnormal   Collection Time    04/04/14  8:26 AM      Result Value Ref Range   Glucose-Capillary 131 (*) 70 - 99 mg/dL  CBC     Status: Abnormal   Collection Time    04/04/14  9:00 AM      Result Value Ref Range   WBC 6.1  4.0 - 10.5 K/uL   RBC 3.13 (*) 4.22 - 5.81 MIL/uL   Hemoglobin 9.6 (*) 13.0 - 17.0 g/dL   HCT 29.5 (*) 39.0 - 52.0 %   MCV 94.2  78.0 - 100.0 fL   MCH 30.7  26.0 - 34.0 pg   MCHC 32.5  30.0 - 36.0 g/dL   RDW 14.4  11.5 - 15.5 %   Platelets 152  150 - 400 K/uL  PROTIME-INR     Status: Abnormal   Collection Time    04/04/14  9:00 AM      Result Value Ref Range   Prothrombin Time 15.8 (*) 11.6 - 15.2 seconds   INR 1.26  0.00 - 2.70  BASIC METABOLIC PANEL     Status: Abnormal   Collection Time    04/04/14  9:00 AM      Result  Value Ref Range   Sodium 145  137 - 147 mEq/L   Potassium 4.5  3.7 - 5.3 mEq/L   Chloride 107  96 - 112 mEq/L   CO2 24  19 - 32 mEq/L   Glucose, Bld 202 (*) 70 - 99 mg/dL   BUN 18  6 - 23 mg/dL   Creatinine, Ser 1.30  0.50 - 1.35 mg/dL   Calcium 9.0  8.4 - 10.5 mg/dL   GFR calc non Af Amer 51 (*) >90 mL/min   GFR calc Af Amer 59 (*) >90 mL/min   Comment: (NOTE)     The eGFR has been calculated using the CKD EPI equation.     This calculation has not been validated in all clinical situations.     eGFR's persistently <90 mL/min signify possible Chronic Kidney     Disease.   Anion gap 14  5 - 15  GLUCOSE, CAPILLARY     Status: Abnormal   Collection Time    04/04/14 12:24 PM      Result Value Ref Range    Glucose-Capillary 137 (*) 70 - 99 mg/dL   No results found.  Review of Systems  Constitutional: Negative.   HENT: Negative.   Eyes: Negative.   Respiratory: Negative.   Cardiovascular: Negative.   Gastrointestinal: Positive for blood in stool. Negative for heartburn, nausea, vomiting, abdominal pain, diarrhea and melena.  Skin: Negative.   Neurological: Positive for seizures.  Endo/Heme/Allergies: Negative.   Psychiatric/Behavioral: Negative.    Blood pressure 134/72, pulse 57, temperature 97.6 F (36.4 C), temperature source Oral, resp. rate 16, height $RemoveBe'5\' 9"'[id number]$  (1.753 m), weight 117.073 kg (258 lb 1.6 oz), SpO2 98.00%. Physical Exam  Constitutional: He is oriented to person, place, and time. He appears well-developed and well-nourished.  HENT:  Head: Normocephalic and atraumatic.  Eyes: Conjunctivae and EOM are normal. Pupils are equal, round, and reactive to light.  Neck: Normal range of motion. Neck supple.  Cardiovascular: Normal rate.  An irregularly irregular rhythm present.  Respiratory: Effort normal and breath sounds normal.  GI: Soft. Bowel sounds are normal. He exhibits no distension and no mass. There is no tenderness. There is no rebound and no guarding.  Midline scar from a previous hemicolectomy and a large inguinal hernia on the left side  Musculoskeletal: Normal range of motion.  Neurological: He is alert and oriented to person, place, and time.  Skin: Skin is warm and dry.  Psychiatric: He has a normal mood and affect. His behavior is normal. Judgment and thought content normal.   Assessment/Plan: 1) Rectal bleeding with anemia & a personal history of colon cancer: proceed with a colonoscopy tomorrow. Will prep the patient tonight. I think this is in all probability a diverticular bleed.  2) Atrial fibrillation-anticoagulants are on hold.  3) Seizure disorder.   Rebekah Sprinkle 04/04/2014, 1:58 PM

## 2014-04-04 NOTE — Progress Notes (Addendum)
[facility] PROGRESS NOTE  Andrew [name] [id number] DOB: Apr 21, 1934 DOA: 04/02/2014 PCP: Pcp Not In System  Assessment/Plan  Rectal bleeding history of colon cancer. Differential diagnosis includes AVM, diverticulosis, recurrent colon cancer.  Sees GI in [place], last colonoscopy about 6 years ago.  Never previously told about diverticulosis.  Bleeding slowing spontaneously. -  Problem with AML:  Redrawing now -  Spoke with Dr. Collene Mares yesterday who recommend observation and if no further bleeding in next 24 hours, okay to d/c to follow up with primary gastroenterologist, however, the patient is requesting inpatient colonoscopy because he does not want to follow up his previous GI MD who moved to [place] and he wants to find out what was wrong. -  Will officially consult GI for colonoscopy -  Continue PPI  Coagulopathy secondary to Coumadin, patient received 2 units of FFP and vitamin K -  Followup INR -  Daily CBC:  hgb improved today  A-fib, rate controlled -  Continue metoprolol -  Coumadin on hold due to GIB  Acute blood loss anemia, hemoglobin stable.  Thrombocytopenia, likely due to consumption from GIB, resolved.  Seizure disorder, seizure-free, continue carbamazepine and the zonisamide  T2DM, CBG well controlled. -  Hold oral medications -  SSI  CKD -  F/u AML -  Renally dose medication -  Avoid nephrotoxins  Falls at home -  PT evaluation pending  Diet:  Change to CLD in case of colonoscopy Access:  PIV IVF:  off Proph:  SCD  Code Status: full Family Communication: patient alone Disposition Plan:  Bleeding slowing, but patient requesting inpatient colonoscopy now and states his previous GI MD "screwed up" and moved to [place].     Consultants:  GI by phone, spoke with Dr. Collene Mares  Procedures:  None  Antibiotics:  None   HPI/Subjective:  Two very small dark stools since yesterday.  Denies abdominal pain, nausea.    Objective: Filed  Vitals:   04/03/14 1806 04/03/14 2229 04/04/14 0234 04/04/14 0454  BP: 147/63 169/69 125/75 134/72  Pulse: 85 94  57  Temp:  98.4 F (36.9 C)  97.6 F (36.4 C)  TempSrc:  Oral  Oral  Resp:  16  16  Height:      Weight:      SpO2:  98%  98%    Intake/Output Summary (Last 24 hours) at 04/04/14 0911 Last data filed at 04/04/14 0800  Gross per 24 hour  Intake    600 ml  Output      0 ml  Net    600 ml   Filed Weights   04/02/14 0344  Weight: 117.073 kg (258 lb 1.6 oz)    Exam:   General:  CM, No acute distress,   HEENT:  NCAT, MMM  Cardiovascular:  Nl S1, S2 no mrg, 2+ pulses, warm extremities  Respiratory:  CTAB, no increased WOB  Abdomen:   NABS, soft, NT/ND, reducible large hernia left lower abdomen   MSK:   Normal tone and bulk, no LEE  Neuro:  Grossly intact  Skin:  Ecchymosis right lateral buttock/hip  Data Reviewed: Basic Metabolic Panel:  Recent Labs Lab 04/01/14 2000 04/02/14 0545  NA 145 144  K 4.5 4.0  CL 105 109  CO2 24 22  GLUCOSE 125* 138*  BUN 27* 29*  CREATININE 1.72* 1.48*  CALCIUM 8.7 7.9*   Liver Function Tests:  Recent Labs Lab 04/01/14 2000 04/02/14 0545  AST 16 12  ALT 11 9  ALKPHOS 78 67  BILITOT 0.4 0.4  PROT 7.0 6.0  ALBUMIN 4.0 3.4*   No results found for this basename: LIPASE, AMYLASE,  in the last 168 hours No results found for this basename: AMMONIA,  in the last 168 hours CBC:  Recent Labs Lab 04/01/14 2000 04/02/14 0150 04/02/14 0545 04/02/14 1035 04/02/14 1820 04/03/14 0510  WBC 7.3 10.1 7.4 5.9 6.4 6.8  NEUTROABS 4.5  --   --   --   --   --   HGB 11.5* 11.5* 9.7* 9.8* 9.6* 9.1*  HCT 35.0* 35.5* 29.6* 30.2* 29.5* 27.6*  MCV 93.6 94.4 94.3 94.1 93.7 93.2  PLT 159 176 136* 130* 138* 126*   Cardiac Enzymes: No results found for this basename: CKTOTAL, CKMB, CKMBINDEX, TROPONINI,  in the last 168 hours BNP (last 3 results) No results found for this basename: PROBNP,  in the last 8760  hours CBG:  Recent Labs Lab 04/03/14 1722 04/03/14 2010 04/04/14 0026 04/04/14 0413 04/04/14 0826  GLUCAP 106* 160* 113* 130* 131*    No results found for this or any previous visit (from the past 240 hour(s)).   Studies: No results found.  Scheduled Meds: . antiseptic oral rinse  15 mL Mouth Rinse BID  . carbamazepine  300 mg Oral BID  . hydrOXYzine  25 mg Oral QHS  . insulin aspart  0-9 Units Subcutaneous TID WC  . metoprolol succinate  12.5 mg Oral QPM  . pantoprazole  40 mg Oral BID  . zonisamide  100 mg Oral BID   Continuous Infusions:   Principal Problem:   GI bleed Active Problems:   Atrial fibrillation   History of colon cancer   Seizure   Chronic kidney disease, stage 3, mod decreased GFR   Thrombocytopenia, unspecified   Acute blood loss anemia    Time spent: 30 min    Merlon Alcorta, [facility] Pager [phone]. If 7PM-7AM, please contact night-coverage at [email or web address], password [facility] 04/04/2014, 9:11 AM  LOS: 2 days

## 2014-04-05 ENCOUNTER — Inpatient Hospital Stay (HOSPITAL_COMMUNITY): Payer: Non-veteran care | Admitting: Certified Registered"

## 2014-04-05 ENCOUNTER — Encounter (HOSPITAL_COMMUNITY)
Admission: EM | Disposition: A | Discharge status: Home / Self Care | Payer: Self-pay | Source: Home / Self Care | Attending: Internal Medicine

## 2014-04-05 ENCOUNTER — Encounter (HOSPITAL_COMMUNITY): Payer: Non-veteran care | Admitting: Certified Registered"

## 2014-04-05 ENCOUNTER — Encounter (HOSPITAL_COMMUNITY): Payer: Self-pay | Admitting: Certified Registered"

## 2014-04-05 DIAGNOSIS — E119 Type 2 diabetes mellitus without complications: Secondary | ICD-10-CM

## 2014-04-05 DIAGNOSIS — G40909 Epilepsy, unspecified, not intractable, without status epilepticus: Secondary | ICD-10-CM

## 2014-04-05 DIAGNOSIS — K5731 Diverticulosis of large intestine without perforation or abscess with bleeding: Principal | ICD-10-CM

## 2014-04-05 DIAGNOSIS — K922 Gastrointestinal hemorrhage, unspecified: Secondary | ICD-10-CM

## 2014-04-05 DIAGNOSIS — K648 Other hemorrhoids: Secondary | ICD-10-CM

## 2014-04-05 DIAGNOSIS — D126 Benign neoplasm of colon, unspecified: Secondary | ICD-10-CM

## 2014-04-05 DIAGNOSIS — D696 Thrombocytopenia, unspecified: Secondary | ICD-10-CM

## 2014-04-05 DIAGNOSIS — K5730 Diverticulosis of large intestine without perforation or abscess without bleeding: Secondary | ICD-10-CM

## 2014-04-05 HISTORY — PX: COLONOSCOPY: SHX5424

## 2014-04-05 LAB — CBC
HCT: 28.6 % — ABNORMAL LOW (ref 39.0–52.0)
Hemoglobin: 9.6 g/dL — ABNORMAL LOW (ref 13.0–17.0)
MCH: 32.1 pg (ref 26.0–34.0)
MCHC: 33.6 g/dL (ref 30.0–36.0)
MCV: 95.7 fL (ref 78.0–100.0)
Platelets: 129 10*3/uL — ABNORMAL LOW (ref 150–400)
RBC: 2.99 MIL/uL — ABNORMAL LOW (ref 4.22–5.81)
RDW: 14.4 % (ref 11.5–15.5)
WBC: 5.6 10*3/uL (ref 4.0–10.5)

## 2014-04-05 LAB — GLUCOSE, CAPILLARY
GLUCOSE-CAPILLARY: 130 mg/dL — AB (ref 70–99)
GLUCOSE-CAPILLARY: 146 mg/dL — AB (ref 70–99)
Glucose-Capillary: 94 mg/dL (ref 70–99)
Glucose-Capillary: 116 mg/dL — ABNORMAL HIGH (ref 70–99)
Glucose-Capillary: 152 mg/dL — ABNORMAL HIGH (ref 70–99)

## 2014-04-05 LAB — PROTIME-INR
INR: 1.2 (ref 0.00–1.49)
Prothrombin Time: 15.2 seconds (ref 11.6–15.2)

## 2014-04-05 SURGERY — COLONOSCOPY
Anesthesia: Monitor Anesthesia Care

## 2014-04-05 SURGERY — COLONOSCOPY
Anesthesia: Moderate Sedation

## 2014-04-05 MED ORDER — LACTATED RINGERS IV SOLN
INTRAVENOUS | Status: DC | PRN
  Administered 2014-04-05: 13:00:00 via INTRAVENOUS

## 2014-04-05 MED ORDER — PROPOFOL INFUSION 10 MG/ML OPTIME
INTRAVENOUS | Status: DC | PRN
  Administered 2014-04-05: 125 ug/kg/min via INTRAVENOUS

## 2014-04-05 MED ORDER — SODIUM CHLORIDE 0.9 % IV SOLN
INTRAVENOUS | Status: DC
  Administered 2014-04-05: 09:00:00 via INTRAVENOUS

## 2014-04-05 MED ORDER — WARFARIN SODIUM 2.5 MG PO TABS
2.5000 mg | ORAL_TABLET | Freq: Once | ORAL | Status: AC
  Administered 2014-04-05: 2.5 mg via ORAL
  Filled 2014-04-05: qty 1

## 2014-04-05 MED ORDER — WARFARIN - PHARMACIST DOSING INPATIENT: Freq: Every day | Status: DC

## 2014-04-05 NOTE — Progress Notes (Signed)
Colonoscopy demonstrated a nonbleeding polyp which was removed and diverticulosis.  I saw suspect that his bleeding was from a diverticulum.  Recommendations 1.   Feed patient 2.  okay to restart Coumadin

## 2014-04-05 NOTE — Interval H&P Note (Signed)
History and Physical Interval Note:  04/05/2014 1:23 PM  Andrew [name]  has presented today for surgery, with the diagnosis of gi bleed  The various methods of treatment have been discussed with the patient and family. After consideration of risks, benefits and other options for treatment, the patient has consented to  Procedure(s): COLONOSCOPY (N/A) as a surgical intervention .  The patient's history has been reviewed, patient examined, no change in status, stable for surgery.  I have reviewed the patient's chart and labs.  Questions were answered to the patient's satisfaction.    The recent H&P (dated **04/04/14*) was reviewed, the patient was examined and there is no change in the patients condition since that H&P was completed.   Erskine Emery  04/05/2014, 1:23 PM    Erskine Emery

## 2014-04-05 NOTE — H&P (View-Only) (Signed)
Cross cover [facility] Reason for Consult: Rectal bleeding. Referring Physician: THP.  Andrew [name] is an 80 y.o. male.  HPI: 80 year old white male, with multiple medical issues listed below, gives a history of having a partial colectomy for colon cancer ?15-20 years ago. He claims he has a gastroenterologist in [place] but cannot remember his name. His last colonoscopy, reportedly was about 5-6 ago when he was found to have diverticulosis. He had several episodes of rectal bleeding starting early in the morning on 07/10./15 and again on 04/03/14. He has not had any further bleeding today. He denies having any abdominal pain, nausea or vomiting, diarrhea or constipation.  He is on Coumadin for chronic atrial fibrillation.   Past Medical History  Diagnosis Date  . Colon cancer   . Hypertension   . Diabetes mellitus without complication   . Seizures   . Dysrhythmia   . GI bleed 04/02/2014   Past Surgical History  Procedure Laterality Date  . Colon surgery    . Appendectomy    . Clavicle surgery Right 1954   Family History  Problem Relation Age of Onset  . Diabetes Mellitus II Mother   . Prostate cancer Father   . Diabetes Mellitus II Brother    Social History:  reports that he quit smoking about 44 years ago. He has never used smokeless tobacco. He reports that he does not drink alcohol or use illicit drugs.  Allergies:  Allergies  Allergen Reactions  . Depakote [Divalproex Sodium] Rash  . Dilantin [Phenytoin Sodium Extended] Rash  . Phenobarbital Rash   Medications: I have reviewed the patient's current medications.  Results for orders placed during the hospital encounter of 04/02/14 (from the past 48 hour(s))  GLUCOSE, CAPILLARY     Status: Abnormal   Collection Time    04/02/14  4:53 PM      Result Value Ref Range   Glucose-Capillary 137 (*) 70 - 99 mg/dL  CBC     Status: Abnormal   Collection Time    04/02/14  6:20 PM      Result Value Ref Range   WBC 6.4  4.0 - 10.5  K/uL   RBC 3.15 (*) 4.22 - 5.81 MIL/uL   Hemoglobin 9.6 (*) 13.0 - 17.0 g/dL   HCT 29.5 (*) 39.0 - 52.0 %   MCV 93.7  78.0 - 100.0 fL   MCH 30.5  26.0 - 34.0 pg   MCHC 32.5  30.0 - 36.0 g/dL   RDW 14.4  11.5 - 15.5 %   Platelets 138 (*) 150 - 400 K/uL  GLUCOSE, CAPILLARY     Status: Abnormal   Collection Time    04/02/14  8:03 PM      Result Value Ref Range   Glucose-Capillary 145 (*) 70 - 99 mg/dL  GLUCOSE, CAPILLARY     Status: Abnormal   Collection Time    04/02/14 11:35 PM      Result Value Ref Range   Glucose-Capillary 118 (*) 70 - 99 mg/dL   Comment 1 Notify RN     Comment 2 Documented in Chart    GLUCOSE, CAPILLARY     Status: Abnormal   Collection Time    04/03/14  4:06 AM      Result Value Ref Range   Glucose-Capillary 127 (*) 70 - 99 mg/dL  CBC     Status: Abnormal   Collection Time    04/03/14  5:10 AM      Result  Value Ref Range   WBC 6.8  4.0 - 10.5 K/uL   RBC 2.96 (*) 4.22 - 5.81 MIL/uL   Hemoglobin 9.1 (*) 13.0 - 17.0 g/dL   HCT 27.6 (*) 39.0 - 52.0 %   MCV 93.2  78.0 - 100.0 fL   MCH 30.7  26.0 - 34.0 pg   MCHC 33.0  30.0 - 36.0 g/dL   RDW 14.5  11.5 - 15.5 %   Platelets 126 (*) 150 - 400 K/uL  GLUCOSE, CAPILLARY     Status: Abnormal   Collection Time    04/03/14  8:18 AM      Result Value Ref Range   Glucose-Capillary 131 (*) 70 - 99 mg/dL  PROTIME-INR     Status: Abnormal   Collection Time    04/03/14 11:20 AM      Result Value Ref Range   Prothrombin Time 16.9 (*) 11.6 - 15.2 seconds   INR 1.37  0.00 - 1.49  GLUCOSE, CAPILLARY     Status: Abnormal   Collection Time    04/03/14 12:12 PM      Result Value Ref Range   Glucose-Capillary 153 (*) 70 - 99 mg/dL  GLUCOSE, CAPILLARY     Status: Abnormal   Collection Time    04/03/14  5:22 PM      Result Value Ref Range   Glucose-Capillary 106 (*) 70 - 99 mg/dL  GLUCOSE, CAPILLARY     Status: Abnormal   Collection Time    04/03/14  8:10 PM      Result Value Ref Range   Glucose-Capillary 160 (*)  70 - 99 mg/dL   Comment 1 Notify RN    GLUCOSE, CAPILLARY     Status: Abnormal   Collection Time    04/04/14 12:26 AM      Result Value Ref Range   Glucose-Capillary 113 (*) 70 - 99 mg/dL   Comment 1 Notify RN    GLUCOSE, CAPILLARY     Status: Abnormal   Collection Time    04/04/14  4:13 AM      Result Value Ref Range   Glucose-Capillary 130 (*) 70 - 99 mg/dL   Comment 1 Notify RN    GLUCOSE, CAPILLARY     Status: Abnormal   Collection Time    04/04/14  8:26 AM      Result Value Ref Range   Glucose-Capillary 131 (*) 70 - 99 mg/dL  CBC     Status: Abnormal   Collection Time    04/04/14  9:00 AM      Result Value Ref Range   WBC 6.1  4.0 - 10.5 K/uL   RBC 3.13 (*) 4.22 - 5.81 MIL/uL   Hemoglobin 9.6 (*) 13.0 - 17.0 g/dL   HCT 29.5 (*) 39.0 - 52.0 %   MCV 94.2  78.0 - 100.0 fL   MCH 30.7  26.0 - 34.0 pg   MCHC 32.5  30.0 - 36.0 g/dL   RDW 14.4  11.5 - 15.5 %   Platelets 152  150 - 400 K/uL  PROTIME-INR     Status: Abnormal   Collection Time    04/04/14  9:00 AM      Result Value Ref Range   Prothrombin Time 15.8 (*) 11.6 - 15.2 seconds   INR 1.26  0.00 - 0.25  BASIC METABOLIC PANEL     Status: Abnormal   Collection Time    04/04/14  9:00 AM      Result  Value Ref Range   Sodium 145  137 - 147 mEq/L   Potassium 4.5  3.7 - 5.3 mEq/L   Chloride 107  96 - 112 mEq/L   CO2 24  19 - 32 mEq/L   Glucose, Bld 202 (*) 70 - 99 mg/dL   BUN 18  6 - 23 mg/dL   Creatinine, Ser 1.30  0.50 - 1.35 mg/dL   Calcium 9.0  8.4 - 10.5 mg/dL   GFR calc non Af Amer 51 (*) >90 mL/min   GFR calc Af Amer 59 (*) >90 mL/min   Comment: (NOTE)     The eGFR has been calculated using the CKD EPI equation.     This calculation has not been validated in all clinical situations.     eGFR's persistently <90 mL/min signify possible Chronic Kidney     Disease.   Anion gap 14  5 - 15  GLUCOSE, CAPILLARY     Status: Abnormal   Collection Time    04/04/14 12:24 PM      Result Value Ref Range    Glucose-Capillary 137 (*) 70 - 99 mg/dL   No results found.  Review of Systems  Constitutional: Negative.   HENT: Negative.   Eyes: Negative.   Respiratory: Negative.   Cardiovascular: Negative.   Gastrointestinal: Positive for blood in stool. Negative for heartburn, nausea, vomiting, abdominal pain, diarrhea and melena.  Skin: Negative.   Neurological: Positive for seizures.  Endo/Heme/Allergies: Negative.   Psychiatric/Behavioral: Negative.    Blood pressure 134/72, pulse 57, temperature 97.6 F (36.4 C), temperature source Oral, resp. rate 16, height $RemoveBe'5\' 9"'gGWscSiku$  (1.753 m), weight 117.073 kg (258 lb 1.6 oz), SpO2 98.00%. Physical Exam  Constitutional: He is oriented to person, place, and time. He appears well-developed and well-nourished.  HENT:  Head: Normocephalic and atraumatic.  Eyes: Conjunctivae and EOM are normal. Pupils are equal, round, and reactive to light.  Neck: Normal range of motion. Neck supple.  Cardiovascular: Normal rate.  An irregularly irregular rhythm present.  Respiratory: Effort normal and breath sounds normal.  GI: Soft. Bowel sounds are normal. He exhibits no distension and no mass. There is no tenderness. There is no rebound and no guarding.  Midline scar from a previous hemicolectomy and a large inguinal hernia on the left side  Musculoskeletal: Normal range of motion.  Neurological: He is alert and oriented to person, place, and time.  Skin: Skin is warm and dry.  Psychiatric: He has a normal mood and affect. His behavior is normal. Judgment and thought content normal.   Assessment/Plan: 1) Rectal bleeding with anemia & a personal history of colon cancer: proceed with a colonoscopy tomorrow. Will prep the patient tonight. I think this is in all probability a diverticular bleed.  2) Atrial fibrillation-anticoagulants are on hold.  3) Seizure disorder.   Naydeen Speirs 04/04/2014, 1:58 PM

## 2014-04-05 NOTE — Op Note (Signed)
[facility] [place], [id number]   COLONOSCOPY PROCEDURE REPORT  PATIENT: Andrew [name], Andrew [name]  MR#: [id number] BIRTHDATE: [birth year] , 42  yrs. old GENDER: Male ENDOSCOPIST: Inda Castle, MD REFERRED BY: PROCEDURE DATE:  04/05/2014 PROCEDURE:   Colonoscopy with snare polypectomy ASA CLASS:   Class II INDICATIONS:hematochezia. MEDICATIONS: MAC sedation, administered by CRNA  DESCRIPTION OF PROCEDURE:   After the risks benefits and alternatives of the procedure were thoroughly explained, informed consent was obtained.       The Pentax Adult Colon [id number] endoscope was introduced through the anus and advanced to the cecum, which was identified by both the appendix and ileocecal valve. No adverse events experienced.   The quality of the prep was good, using MiraLax  The instrument was then slowly withdrawn as the colon was fully examined.      COLON FINDINGS: A sessile polyp measuring 3 mm in size was found in the sigmoid colon.  A polypectomy was performed with a cold snare. The resection was complete and the polyp tissue was completely retrieved.   Moderate diverticulosis was noted in the sigmoid colon and descending colon.   Internal hemorrhoids were found.   The colon was otherwise normal.  There was no diverticulosis, inflammation, polyps or cancers unless previously stated. Retroflexed views revealed no abnormalities. The time to cecum=  . Withdrawal time=11 minutes 0 seconds.  The scope was withdrawn and the procedure completed. COMPLICATIONS: There were no complications.  ENDOSCOPIC IMPRESSION: 1.   Sessile polyp measuring 3 mm in size was found in the sigmoid colon; polypectomy was performed with a cold snare 2.   Moderate diverticulosis was noted in the sigmoid colon and descending colon 3.   Internal hemorrhoids 4.   The colon was otherwise normal  source of bleeding probably diverticula  RECOMMENDATIONS: expectant  management Okay to restart Coumadin  eSigned:  Inda Castle, MD 04/05/2014 2:05 PM   cc: Bobby Rumpf, MD and Marylynn Pearson MD   PATIENT NAME:  Andrew [name], Andrew [name] MR#: [id number]

## 2014-04-05 NOTE — Progress Notes (Signed)
[facility] PROGRESS NOTE  Andrew [name] [id number] DOB: Mar 26, 1934 DOA: 04/02/2014 PCP: Pcp Not In System  Brief narrative: Andrew [name] is a 80yo man with h/o colon cancer s/p colectomy 15-20 years ago per aptient, afib on coumadin, seizure, DM who presented to the ED on 7/10 for rectal bleeding.  Hgb found to be around 11 and he was admitted for further work up. INR reversed and his Hgb stabilized.  He is due for colonoscopy today.   Assessment/Plan:   Rectal bleeding with history of colon cancer.  - Differential includes AVM, diverticulosis, recurrent cancer.  He reports no further bleeding overnight.  He completed colon prep without issue - GI plans for colonoscopy today - Further work up pending results of that study.  - H/H stable today, no transfusion needed   Atrial fibrillation on coumadin with coagulopathy and bleed (See above) - Rate controlled, holding coumadin - INR reversed with FFP and vitamin K - Will likely need to hold coumadin as an outpatient pending what is found on colonoscopy.  - On metoprolol  Seizure disorder - No seizure - Continue home medications of carbamazepine and zonisamide  DM2 - Not in history and no home meds listed, however, noted to be on metformin in previous notes.  - On SSI and CBG range from 94 - 137  Chronic kidney disease, stage 3, mod decreased GFR - Stable, Cr 1.3 today.  - Continue to monitor. - Renally dose medications  Thrombocytopenia, unspecified - Likely related to GIB, monitor closely - 129 today.   Falls at home - PT eval recommended home health PT  Consultants:  GI  PT  Procedures/Studies:  No results found.  Colonoscopy planned today  Antibiotics:  none  Code Status: Full Family Communication: Pt at bedside Disposition Plan: Home when medically stable  HPI/Subjective: No events overnight.  Tolerated GI prep well.   Objective: Filed Vitals:   04/04/14 1823 04/04/14 2100 04/04/14 2216  04/05/14 0555  BP: 134/79 169/80 117/60 103/67  Pulse:  78  65  Temp:  97.4 F (36.3 C)  98.4 F (36.9 C)  TempSrc:  Oral  Oral  Resp:  18  18  Height:      Weight:      SpO2:  97%  98%    Intake/Output Summary (Last 24 hours) at 04/05/14 0857 Last data filed at 04/04/14 1414  Gross per 24 hour  Intake    480 ml  Output      0 ml  Net    480 ml    Exam:   General:  Pt is alert, follows commands appropriately, not in acute distress  Cardiovascular: Regular rate and rhythm, S1/S2, no murmurs  Respiratory: Clear to auscultation bilaterally, no wheezing  Abdomen: Soft, non tender, non distended, bowel sounds present  Extremities: No edema  Neuro: Grossly nonfocal  Data Reviewed: Basic Metabolic Panel:  Recent Labs Lab 04/01/14 2000 04/02/14 0545 04/04/14 0900  NA 145 144 145  K 4.5 4.0 4.5  CL 105 109 107  CO2 24 22 24   GLUCOSE 125* 138* 202*  BUN 27* 29* 18  CREATININE 1.72* 1.48* 1.30  CALCIUM 8.7 7.9* 9.0   Liver Function Tests:  Recent Labs Lab 04/01/14 2000 04/02/14 0545  AST 16 12  ALT 11 9  ALKPHOS 78 67  BILITOT 0.4 0.4  PROT 7.0 6.0  ALBUMIN 4.0 3.4*   CBC:  Recent Labs Lab 04/01/14 2000  04/02/14 1035 04/02/14 1820 04/03/14 0510 04/04/14 0900  04/05/14 0705  WBC 7.3  < > 5.9 6.4 6.8 6.1 5.6  NEUTROABS 4.5  --   --   --   --   --   --   HGB 11.5*  < > 9.8* 9.6* 9.1* 9.6* 9.6*  HCT 35.0*  < > 30.2* 29.5* 27.6* 29.5* 28.6*  MCV 93.6  < > 94.1 93.7 93.2 94.2 95.7  PLT 159  < > 130* 138* 126* 152 129*  < > = values in this interval not displayed. CBG:  Recent Labs Lab 04/04/14 0826 04/04/14 1224 04/04/14 1738 04/04/14 2105 04/05/14 0747  GLUCAP 131* 137* 94 134* 130*    No results found for this or any previous visit (from the past 240 hour(s)).   Scheduled Meds: . antiseptic oral rinse  15 mL Mouth Rinse BID  . carbamazepine  300 mg Oral BID  . hydrOXYzine  25 mg Oral QHS  . insulin aspart  0-9 Units Subcutaneous  TID WC  . metoprolol succinate  12.5 mg Oral QPM  . pantoprazole  40 mg Oral BID  . zonisamide  100 mg Oral BID   Continuous Infusions:    Andrew [name], Andrew [name]  [facility] Pager [phone]  If 7PM-7AM, please contact night-coverage [email or web address] Password [email or web address] 04/05/2014, 8:57 AM   LOS: 3 days

## 2014-04-05 NOTE — Transfer of Care (Signed)
Immediate Anesthesia Transfer of Care Note  Patient: Andrew [name]  Procedure(s) Performed: Procedure(s): COLONOSCOPY (N/A)  Patient Location: Endoscopy Unit  Anesthesia Type:MAC  Level of Consciousness: awake, alert  and oriented  Airway & Oxygen Therapy: Patient Spontanous Breathing and Patient connected to nasal cannula oxygen  Post-op Assessment: Report given to PACU RN, Post -op Vital signs reviewed and stable and Patient moving all extremities X 4  Post vital signs: Reviewed and stable  Complications: No apparent anesthesia complications

## 2014-04-05 NOTE — Anesthesia Preprocedure Evaluation (Addendum)
Anesthesia Evaluation  Patient identified by MRN, date of birth, ID band  Airway Mallampati: I TM Distance: >3 FB Neck ROM: Full    Dental  (+) Edentulous Upper, Edentulous Lower   Pulmonary former smoker,  breath sounds clear to auscultation        Cardiovascular hypertension, Rhythm:Irregular Rate:Normal     Neuro/Psych Seizures -,     GI/Hepatic Gi bleed   Endo/Other  diabetes  Renal/GU      Musculoskeletal   Abdominal   Peds  Hematology   Anesthesia Other Findings   Reproductive/Obstetrics                          Anesthesia Physical Anesthesia Plan  ASA: III  Anesthesia Plan: MAC   Post-op Pain Management:    Induction: Intravenous  Airway Management Planned: Natural Airway and Nasal Cannula  Additional Equipment:   Intra-op Plan:   Post-operative Plan:   Informed Consent: I have reviewed the patients History and Physical, chart, labs and discussed the procedure including the risks, benefits and alternatives for the proposed anesthesia with the patient or authorized representative who has indicated his/her understanding and acceptance.     Plan Discussed with:   Anesthesia Plan Comments:         Anesthesia Quick Evaluation

## 2014-04-05 NOTE — Care Management Note (Signed)
    Page 1 of 1   04/06/2014     11:10:04 AM CARE MANAGEMENT NOTE 04/06/2014  Patient:  Andrew [name], Andrew [name]   Account Number:  [id number]  Date Initiated:  04/02/2014  Documentation initiated by:  [facility]  Subjective/Objective Assessment:   GI bleed     Action/Plan:   pt eval- rec hhpt and rollator- patient refusing hhpt and has a rollator already since [facility]   Anticipated DC Date:  04/06/2014   Anticipated DC Plan:  [place]  CM consult      [facility] Acute Care Choice  HOME HEALTH   Choice offered to / List presented to:  C-1 Patient        [place] arranged  HH-2 PT  [place] - 11 Patient Refused      Status of service:  Completed, signed off Medicare Important Message given?   (If response is "NO", the following Medicare IM given date fields will be blank) Date Medicare IM given:   Medicare IM given by:   Date Additional Medicare IM given:   Additional Medicare IM given by:    Discharge Disposition:  HOME/SELF CARE  Per UR Regulation:  Reviewed for med. necessity/level of care/duration of stay  If discussed at [place] of Stay Meetings, dates discussed:    Comments:  04/06/14 [place], bSN [phone] patient has medicare as well, NCM offered hh services choice to patient, he refused hhpt.  NCM informed patient that the [place] states patient has a rollator that he got in Oct and insurance will not pay for another one.  Patient has an appoitment for 7/17 at 9:15 at the [facility] for pt/inr draw and the results will need to be sent to [facility], to Trixie Deis. who is the attending, phone is [phone] and fax is [phone].  NCM informed patient with this information.

## 2014-04-05 NOTE — Anesthesia Postprocedure Evaluation (Signed)
Anesthesia Post Note  Patient: Andrew [name]  Procedure(s) Performed: Procedure(s) (LRB): COLONOSCOPY (N/A)  Anesthesia type: general  Patient location: PACU  Post pain: Pain level controlled  Post assessment: Patient's Cardiovascular Status Stable  Last Vitals:  Filed Vitals:   04/05/14 1454  BP: 113/74  Pulse: 86  Temp: 36.6 C  Resp: 18    Post vital signs: Reviewed and stable  Level of consciousness: sedated  Complications: No apparent anesthesia complications

## 2014-04-05 NOTE — Progress Notes (Signed)
ANTICOAGULATION CONSULT NOTE - Initial Consult  Pharmacy Consult for Coumadin  Indication: atrial fibrillation  Allergies  Allergen Reactions  . Depakote [Divalproex Sodium] Rash  . Dilantin [Phenytoin Sodium Extended] Rash  . Phenobarbital Rash    Patient Measurements: Height: 5\' 9"  (175.3 cm) Weight: 258 lb 1.6 oz (117.073 kg) IBW/kg (Calculated) : 70.7 Heparin Dosing Weight: n/a  Vital Signs: Temp: 97.8 F (36.6 C) (07/13 1454) Temp src: Oral (07/13 1454) BP: 113/74 mmHg (07/13 1454) Pulse Rate: 86 (07/13 1454)  Labs:  Recent Labs  04/03/14 0510 04/03/14 1120 04/04/14 0900 04/05/14 0705  HGB 9.1*  --  9.6* 9.6*  HCT 27.6*  --  29.5* 28.6*  PLT 126*  --  152 129*  LABPROT  --  16.9* 15.8* 15.2  INR  --  1.37 1.26 1.20  CREATININE  --   --  1.30  --     Estimated Creatinine Clearance: 59.2 ml/min (by C-G formula based on Cr of 1.3).   Medical History: Past Medical History  Diagnosis Date  . Cancer   . Hypertension   . Diabetes mellitus without complication   . Seizures   . Dysrhythmia   . GI bleed 04/02/2014    Medications:  Prescriptions prior to admission  Medication Sig Dispense Refill  . aspirin EC 81 MG tablet Take 81 mg by mouth 2 (two) times daily.      . carbamazepine (CARBATROL) 300 MG 12 hr capsule Take 300 mg by mouth 2 (two) times daily.      . hydrocortisone 2.5 % cream Apply 1 application topically every hour as needed (itching).      . hydrOXYzine (ATARAX/VISTARIL) 25 MG tablet Take 25 mg by mouth at bedtime.      . metoprolol succinate (TOPROL-XL) 25 MG 24 hr tablet Take 12.5 mg by mouth every evening.      Marland Kitchen PRESCRIPTION MEDICATION Place 1 drop into the left eye every evening.      . zonisamide (ZONEGRAN) 100 MG capsule Take 100 mg by mouth 2 (two) times daily.        Assessment: 82 YOM to resume on chronic coumadin therapy for AFib. He is s/p a colonoscopy due to rectal bleeding, that showed a nonbleeding polyp which was removed  and diverticulosis. GI suspects that his bleeding was from his diverticulum. Per GI, okay to restart coumadin therapy. H/H and Plt are low. INR today 1.2.   Home Coumadin dose: 10 mg daily (admitted with supra-therapeutic INR)  Goal of Therapy:  INR 2-3 Monitor platelets by anticoagulation protocol: Yes   Plan:  -Coumadin 2.5 mg x 1 dose today  -Daily PT/INR -Monitor for s/s of bleeding -At discharge, consider dose of 2.5 mg daily with outpatient f/u in 2-3 days.   Albertina Parr, PharmD.  Clinical Pharmacist Pager [phone]

## 2014-04-06 ENCOUNTER — Encounter (HOSPITAL_COMMUNITY): Payer: Self-pay | Admitting: Gastroenterology

## 2014-04-06 LAB — CBC
HCT: 28.2 % — ABNORMAL LOW (ref 39.0–52.0)
Hemoglobin: 9.3 g/dL — ABNORMAL LOW (ref 13.0–17.0)
MCH: 31.6 pg (ref 26.0–34.0)
MCHC: 33 g/dL (ref 30.0–36.0)
MCV: 95.9 fL (ref 78.0–100.0)
PLATELETS: 133 10*3/uL — AB (ref 150–400)
RBC: 2.94 MIL/uL — ABNORMAL LOW (ref 4.22–5.81)
RDW: 14.5 % (ref 11.5–15.5)
WBC: 6.6 10*3/uL (ref 4.0–10.5)

## 2014-04-06 LAB — GLUCOSE, CAPILLARY
Glucose-Capillary: 184 mg/dL — ABNORMAL HIGH (ref 70–99)
Glucose-Capillary: 225 mg/dL — ABNORMAL HIGH (ref 70–99)

## 2014-04-06 LAB — PROTIME-INR
INR: 1.19 (ref 0.00–1.49)
Prothrombin Time: 15.1 seconds (ref 11.6–15.2)

## 2014-04-06 MED ORDER — METFORMIN HCL 500 MG PO TABS: 500.0000 mg | ORAL_TABLET | ORAL | Status: DC

## 2014-04-06 MED ORDER — WARFARIN SODIUM 2.5 MG PO TABS: 2.5000 mg | ORAL_TABLET | Freq: Every day | ORAL | Status: DC

## 2014-04-06 MED ORDER — WARFARIN SODIUM 2.5 MG PO TABS
2.5000 mg | ORAL_TABLET | Freq: Every day | ORAL | Status: DC
  Filled 2014-04-06: qty 1

## 2014-04-06 NOTE — Progress Notes (Signed)
Physical Therapy Treatment Patient Details Name: Andrew [name] MRN: [id number] DOB: September 27, 1933 Today's Date: 2014/05/06    History of Present Illness Patient is a 80 yo male admitted 04/02/14 with rectal bleeding - poss GIB, and anemia.  Patient with h/o colon CA, Afib, seizures, DM, HTN, and multiple falls.    PT Comments    Pt eager to D/C and currently refusing HHPT or OPPT and states he doesn't need it despite education for safety concerns, fall risk and impaired balance. Pt educated to use RW at all times and for light on and safety with management at night given 2 falls reported when pt waking up. Will continue to follow.   Follow Up Recommendations  Home health PT;Supervision for mobility/OOB     Equipment Recommendations  None recommended by PT    Recommendations for Other Services       Precautions / Restrictions Precautions Precautions: Fall    Mobility  Bed Mobility               General bed mobility comments: EOB on arrival  Transfers Overall transfer level: Modified independent                  Ambulation/Gait Ambulation/Gait assistance: Supervision Ambulation Distance (Feet): 350 Feet Assistive device: Rolling walker (2 wheeled) Gait Pattern/deviations: Step-through pattern;Trunk flexed   Gait velocity interpretation: at or above normal speed for age/gender General Gait Details: cues for need to continue to use RW in and out of the house with cues for position in RW   Stairs Stairs: Yes   Stair Management: One rail Left;Forwards;Alternating pattern Number of Stairs: 6    Wheelchair Mobility    Modified Rankin (Stroke Patients Only)       Balance                                    Cognition Arousal/Alertness: Awake/alert Behavior During Therapy: WFL for tasks assessed/performed Overall Cognitive Status: Impaired/Different from baseline Area of Impairment: Safety/judgement         Safety/Judgement: Decreased  awareness of deficits          Exercises      General Comments        Pertinent Vitals/Pain No pain    Home Living                      Prior Function            PT Goals (current goals can now be found in the care plan section) Progress towards PT goals: Goals met and updated - see care plan    Frequency       PT Plan Current plan remains appropriate    Co-evaluation             End of Session   Activity Tolerance: Patient tolerated treatment well Patient left: in bed;with call bell/phone within reach;with family/visitor present     Time: 8325-4982 PT Time Calculation (min): 20 min  Charges:  $Gait Training: 8-22 mins                    G Codes:      Melford Aase 2014/05/06, 12:20 PM Elwyn Reach, [employer]

## 2014-04-06 NOTE — Discharge Summary (Signed)
Physician Discharge Summary  [name] Andrew [id number] DOB: 1934-02-01 DOA: 04/02/2014  PCP: Pcp Not In System  Admit date: 04/02/2014 Discharge date: 04/06/2014  Recommendations for Outpatient Follow-up:  1. Pt will need to follow up with PCP in 2-3 weeks post discharge 2. Follow up with [facility] for INR check in 2-3 days 3. Please obtain BMP to evaluate electrolytes and kidney function 4. Please also check CBC to evaluate Hg and Hct levels 5. Patient refused home health PT  Discharge Diagnoses:  Principal Problem:   GI bleed Active Problems:   Atrial fibrillation   History of colon cancer   Seizure   Chronic kidney disease, stage 3, mod decreased GFR   Thrombocytopenia, unspecified   Acute blood loss anemia   Diverticulosis of colon with hemorrhage   Benign neoplasm of colon   Internal hemorrhoids without mention of complication   Discharge Condition: Stable  Diet recommendation: Heart healthy diet  History of present illness from H&P by Dr. Hal Hope:  Andrew [name] is a 80 y.o. male with history of colon cancer status post colectomy 15-20 years ago as per the patient, atrial fibrillation on Coumadin, Seizure and diabetes mellitus presents to the ER because of rectal bleeding. Patient states that he started noticing rectal bleeding yesterday morning at 3 AM and had another episode last evening 4 PM and he decided to come to the ER. In the ER he had another 3 episodes of rectal bleeding which was frankly bloody. Denies any abdominal pain nausea vomiting. Denies using any recent antibiotics or NSAIDs. He is on Coumadin for atrial fibrillation INR is supratherapeutic. Patient's hemoglobin was found to be around 11 and patient is hemodynamically stable and will be admitted for further management of his rectal bleeding. Denies any chest pain or shortness of breath.    Hospital Course:  Lower GI bleed from diverticular disease and History of colon cancer:   Initially placed NPO and put on protonix.  His INR was reversed with vitamin K and FFP.  He did not require a blood transfusion.  Nadir of hgb was 9.1 and it stabilized after that.  GI was consulted and took the patient for colonoscopy which found a polyp measuring 58mm in size and moderate diverticulosis, which was the presumed source of bleeding.  Since his H/H stabilized, he was restarted on his coumadin, after discussion with the patient.  He was also requested to establish care in [place] (normally sees the [place]) to have closer follow up of his INR.    Atrial fibrillation on anticoagulation with coumadin: He was supratherapeutic on his INR on admission to 4.33 and this was reversed in the setting of GIB.  On discharge, he had been placed back on his coumadin and his INR was 1.2.  He had follow up in [place] arranged as he is normally a patient of the [place] and is only seen monthly for his anticoagulation.    Chronic kidney disease, stage 3: Acute on chronic increase in Cr when admitted, thought due to GIB.  His Cr improved from 1.72 on admission to 1.3 on discharge.    Seizure: No seizure while in hospital.  Home meds were continued    Thrombocytopenia, unspecified: thought to be due to GIB, monitor as outpatient  Acute blood loss anemia: Hgb dropped to around 9 and stabilized.  Did not require blood transfusion (had FFP for elevated INR).    Internal hemorrhoids without mention of complication: No issue while inpatient.  Procedures/Studies:  No results found. Colonoscopy Report: ENDOSCOPIC IMPRESSION:  1. Sessile polyp measuring 3 mm in size was found in the sigmoid  colon; polypectomy was performed with a cold snare  2. Moderate diverticulosis was noted in the sigmoid colon and  descending colon  3. Internal hemorrhoids  4. The colon was otherwise normal  source of bleeding probably diverticula  RECOMMENDATIONS:  expectant management  Okay to restart  Coumadin   Consultations:  GI  PT  Antibiotics:  none  Discharge Exam: Filed Vitals:   04/06/14 0443  BP: 119/73  Pulse: 66  Temp: 98 F (36.7 C)  Resp: 18   Filed Vitals:   04/05/14 1430 04/05/14 1454 04/05/14 2111 04/06/14 0443  BP: 128/50 113/74 133/73 119/73  Pulse: 69 86 75 66  Temp:  97.8 F (36.6 C) 98.6 F (37 C) 98 F (36.7 C)  TempSrc:  Oral Oral Oral  Resp: 11 18 18 18   Height:      Weight:      SpO2: 100% 100% 97% 95%   General: Pt is alert, follows commands appropriately, not in acute distress  Cardiovascular: Regular rate and rhythm, no murmurs  Respiratory: Clear to auscultation bilaterally, no wheezing or crackles  Abdomen: Soft, non tender, non distended, + BS  Extremities: No edema, pulses palpable in PT bilaterally Neuro: Grossly nonfocal   Discharge Instructions     Medication List    ASK your doctor about these medications       aspirin EC 81 MG tablet  Take 81 mg by mouth 2 (two) times daily.     carbamazepine 300 MG 12 hr capsule  Commonly known as:  CARBATROL  Take 300 mg by mouth 2 (two) times daily.     hydrocortisone 2.5 % cream  Apply 1 application topically every hour as needed (itching).     hydrOXYzine 25 MG tablet  Commonly known as:  ATARAX/VISTARIL  Take 25 mg by mouth at bedtime.     metoprolol succinate 25 MG 24 hr tablet  Commonly known as:  TOPROL-XL  Take 12.5 mg by mouth every evening.     PRESCRIPTION MEDICATION  Place 1 drop into the left eye every evening.     zonisamide 100 MG capsule  Commonly known as:  ZONEGRAN  Take 100 mg by mouth 2 (two) times daily.          The results of significant diagnostics from this hospitalization (including imaging, microbiology, ancillary and laboratory) are listed below for reference.     Microbiology: No results found for this or any previous visit (from the past 240 hour(s)).   Labs: Basic Metabolic Panel:  Recent Labs Lab 04/01/14 2000  04/02/14 0545 04/04/14 0900  NA 145 144 145  K 4.5 4.0 4.5  CL 105 109 107  CO2 24 22 24   GLUCOSE 125* 138* 202*  BUN 27* 29* 18  CREATININE 1.72* 1.48* 1.30  CALCIUM 8.7 7.9* 9.0   Liver Function Tests:  Recent Labs Lab 04/01/14 2000 04/02/14 0545  AST 16 12  ALT 11 9  ALKPHOS 78 67  BILITOT 0.4 0.4  PROT 7.0 6.0  ALBUMIN 4.0 3.4*   No results found for this basename: LIPASE, AMYLASE,  in the last 168 hours No results found for this basename: AMMONIA,  in the last 168 hours CBC:  Recent Labs Lab 04/01/14 2000  04/02/14 1820 04/03/14 0510 04/04/14 0900 04/05/14 0705 04/06/14 0603  WBC 7.3  < > 6.4 6.8 6.1  5.6 6.6  NEUTROABS 4.5  --   --   --   --   --   --   HGB 11.5*  < > 9.6* 9.1* 9.6* 9.6* 9.3*  HCT 35.0*  < > 29.5* 27.6* 29.5* 28.6* 28.2*  MCV 93.6  < > 93.7 93.2 94.2 95.7 95.9  PLT 159  < > 138* 126* 152 129* 133*  < > = values in this interval not displayed. Cardiac Enzymes: No results found for this basename: CKTOTAL, CKMB, CKMBINDEX, TROPONINI,  in the last 168 hours BNP: BNP (last 3 results) No results found for this basename: PROBNP,  in the last 8760 hours CBG:  Recent Labs Lab 04/05/14 0747 04/05/14 1158 04/05/14 1636 04/05/14 2114 04/06/14 0743  GLUCAP 130* 116* 152* 146* 184*     SIGNED: Time coordinating discharge: 35 minutes  Havyn Ramo, MD  [facility] 04/06/2014, 9:49 AM Pager [phone]  If 7PM-7AM, please contact night-coverage [email or web address] Password [email or web address]

## 2014-04-06 NOTE — Progress Notes (Signed)
Nsg Discharge Note  Admit Date:  04/02/2014 Discharge date: 04/06/2014   Ward Givens to be D/C'd Home per MD order.  AVS completed.  Copy for chart, and copy for patient signed, and dated. Patient/caregiver able to verbalize understanding.  Discharge Medication:   Medication List         aspirin EC 81 MG tablet  Take 81 mg by mouth 2 (two) times daily.     carbamazepine 300 MG 12 hr capsule  Commonly known as:  CARBATROL  Take 300 mg by mouth 2 (two) times daily.     hydrocortisone 2.5 % cream  Apply 1 application topically every hour as needed (itching).     hydrOXYzine 25 MG tablet  Commonly known as:  ATARAX/VISTARIL  Take 25 mg by mouth at bedtime.     metFORMIN 500 MG tablet  Commonly known as:  GLUCOPHAGE  Take 1 tablet (500 mg total) by mouth as directed.     metoprolol succinate 25 MG 24 hr tablet  Commonly known as:  TOPROL-XL  Take 12.5 mg by mouth every evening.     PRESCRIPTION MEDICATION  Place 1 drop into the left eye every evening.     warfarin 2.5 MG tablet  Commonly known as:  COUMADIN  Take 1 tablet (2.5 mg total) by mouth daily at 6 PM.     zonisamide 100 MG capsule  Commonly known as:  ZONEGRAN  Take 100 mg by mouth 2 (two) times daily.        Discharge Assessment: Filed Vitals:   04/06/14 0443  BP: 119/73  Pulse: 66  Temp: 98 F (36.7 C)  Resp: 18   Skin clean, dry and intact without evidence of skin break down, no evidence of skin tears noted. Ecchymosis noted bilaterally on upper extremities. IV catheter discontinued intact. Site without signs and symptoms of complications - no redness or edema noted at insertion site, patient denies c/o pain - only slight tenderness at site.  Dressing with slight pressure applied.  D/c Instructions-Education: Discharge instructions given to patient/family with verbalized understanding. D/c education completed with patient/family including follow up instructions, medication list, d/c activities  limitations if indicated, with other d/c instructions as indicated by MD - patient able to verbalize understanding, all questions fully answered. Patient instructed to return to ED, call 911, or call MD for any changes in condition.  Patient escorted via [place], and D/C home via private auto.  Leighanna Kirn, Elissa Hefty, RN 04/06/2014 2:36 PM

## 2014-04-06 NOTE — Discharge Instructions (Addendum)
Andrew [name] - -   You were admitted for bleeding from your GI tract.  You had a colonoscopy which showed diverticulosis, which is the suspected cause of the bleeding.  You are on coumadin for Atrial fibrillation and need to remain on the medication due to your history of strokes.  Your coumadin level (INR) was elevated when you came in bleeding, which may have caused the bleeding to start.  You will need to start back on the coumadin and have your INR checked regularly to make sure that your blood does not get too thin.  You have an appointment with the [facility] here in [place] on Friday.  It is VERY Important that you follow up with that appointment to have your INR checked.   While in the hospital, you also were evaluated by physical therapy who thought you would benefit from physical therapy at home and having a rollater walker.  You have decided not to have physical therapy at home.  As you already have a rolling walker, please be sure to use this device at home to keep your balance.   Please call your doctor or return to the hospital if you have any of the following - - repeat bleeding, dizziness, lightheadedness, chest pain, difficulty breathing, uncontrollable nausea, vomiting or diarrhea.  Fever, chills, severe weakness.    When you see your primary care doctor on 7/27, please have them check your kidney function (BMET) and CBC for your hemoglobin.

## 2014-04-06 NOTE — Progress Notes (Signed)
    Progress Note   Subjective  feels okay today, planning on discharge home   Objective   Vital signs in last 24 hours: Temp:  [97.7 F (36.5 C)-98.6 F (37 C)] 98 F (36.7 C) (07/14 0443) Pulse Rate:  [66-86] 66 (07/14 0443) Resp:  [11-18] 18 (07/14 0443) BP: (113-133)/(50-74) 119/73 mmHg (07/14 0443) SpO2:  [95 %-100 %] 95 % (07/14 0443) Last BM Date: 04/05/14 General:    White male in NAD Heart:  Regular rate, irreg rhythm Abdomen:  Soft, obese, non-tender. Large hernia LLQ. Neurologic:  Alert and oriented,  grossly normal neurologically. Psych:  Cooperative. Normal mood and affect.  Lab Results:  Recent Labs  04/04/14 0900 04/05/14 0705 04/06/14 0603  WBC 6.1 5.6 6.6  HGB 9.6* 9.6* 9.3*  HCT 29.5* 28.6* 28.2*  PLT 152 129* 133*   BMET  Recent Labs  04/04/14 0900  NA 145  K 4.5  CL 107  CO2 24  GLUCOSE 202*  BUN 18  CREATININE 1.30  CALCIUM 9.0   PT/INR  Recent Labs  04/05/14 0705 04/06/14 0603  LABPROT 15.2 15.1  INR 1.20 1.19     Assessment / Plan:    1. Rectal bleeding, likely diverticular hemorrhage in setting of anticoagulation. Colonoscopy demonstrated a nonbleeding polyp (removed) and diverticulosis. No further bleeding. Stable for discharge from GI standpoint   2. Anemia of acute blood loss, hgb stable at 9.3  3. Chronic anticoagulation. Coumadin was restarted    LOS: 4 days   Tye Savoy  04/06/2014, 9:17 AM  GI Attending Note  I have personally taken an interval history, reviewed the chart, and examined the patient.  I agree with the extender's note, impression and recommendations.  Sandy Salaam. Deatra Ina, MD, [facility] [phone]

## 2014-04-06 NOTE — Progress Notes (Signed)
[facility] PROGRESS NOTE  Andrew [name] [id number] DOB: 1934/03/03 DOA: 04/02/2014 PCP: Pcp Not In System  Brief narrative: Andrew [name] is a 80yo man with h/o colon cancer s/p colectomy 15-20 years ago per aptient, afib on coumadin, seizure, DM who presented to the ED on 7/10 for rectal bleeding.  Hgb found to be around 11 and he was admitted for further work up. INR reversed and his Hgb stabilized.  Colonoscopy showed diverticular disease.  Coumadin restarted  Assessment/Plan:   Rectal bleeding with history of colon cancer.  - Likely due to diverticular disease - H/H stable today, no transfusion needed today  Atrial fibrillation on coumadin with coagulopathy and bleed (See above) - Rate controlled; On metoprolol - Restarted coumadin last night, no bridge - INR 1.19  Seizure disorder - No seizure since hospitalization - Continue home medications of carbamazepine and zonisamide  DM2 - Not in history and no home meds listed, however, noted to be on metformin in previous notes.  - On SSI and CBG range from 116-184  Chronic kidney disease, stage 3, mod decreased GFR - Stable, Cr 1.3 yesterday - Renally dose medications  Thrombocytopenia, unspecified - Likely related to GIB, monitor closely - 133 today.   Falls at home - PT eval recommended home health PT  Consultants:  GI  PT  Procedures/Studies: No results found.  Colonoscopy yesterday  Antibiotics:  none  Code Status: Full Family Communication: Pt at bedside and spoke with Daughter yesterday evening.  Disposition Plan: Home when medically stable, barriers to discharge include that he needs an appointment in coumadin clinic this week for INR check, he will also need home health needs supplied through [facility].   HPI/Subjective: No events overnight.  Patient very interested in going home, some confusion as he was told he would have ultrasound today, but I could find no record of an ultrasound order.  Coumadin  restarted.    Objective: Filed Vitals:   04/05/14 1430 04/05/14 1454 04/05/14 2111 04/06/14 0443  BP: 128/50 113/74 133/73 119/73  Pulse: 69 86 75 66  Temp:  97.8 F (36.6 C) 98.6 F (37 C) 98 F (36.7 C)  TempSrc:  Oral Oral Oral  Resp: 11 18 18 18   Height:      Weight:      SpO2: 100% 100% 97% 95%    Intake/Output Summary (Last 24 hours) at 04/06/14 [id number] Last data filed at 04/05/14 2158  Gross per 24 hour  Intake    120 ml  Output      0 ml  Net    120 ml    Exam:   General:  Pt is alert, follows commands appropriately, not in acute distress  Cardiovascular: Regular rate and rhythm, no murmurs  Respiratory: Clear to auscultation bilaterally, no wheezing or crackles  Abdomen: Soft, non tender, non distended, + BS  Extremities: No edema, pulses palpable in PT bilaterally  Neuro: Grossly nonfocal  Data Reviewed: Basic Metabolic Panel:  Recent Labs Lab 04/01/14 2000 04/02/14 0545 04/04/14 0900  NA 145 144 145  K 4.5 4.0 4.5  CL 105 109 107  CO2 24 22 24   GLUCOSE 125* 138* 202*  BUN 27* 29* 18  CREATININE 1.72* 1.48* 1.30  CALCIUM 8.7 7.9* 9.0   Liver Function Tests:  Recent Labs Lab 04/01/14 2000 04/02/14 0545  AST 16 12  ALT 11 9  ALKPHOS 78 67  BILITOT 0.4 0.4  PROT 7.0 6.0  ALBUMIN 4.0 3.4*   CBC:  Recent Labs Lab 04/01/14 2000  04/02/14 1820 04/03/14 0510 04/04/14 0900 04/05/14 0705 04/06/14 0603  WBC 7.3  < > 6.4 6.8 6.1 5.6 6.6  NEUTROABS 4.5  --   --   --   --   --   --   HGB 11.5*  < > 9.6* 9.1* 9.6* 9.6* 9.3*  HCT 35.0*  < > 29.5* 27.6* 29.5* 28.6* 28.2*  MCV 93.6  < > 93.7 93.2 94.2 95.7 95.9  PLT 159  < > 138* 126* 152 129* 133*  < > = values in this interval not displayed. CBG:  Recent Labs Lab 04/04/14 2105 04/05/14 0747 04/05/14 1158 04/05/14 1636 04/05/14 2114  GLUCAP 134* 130* 116* 152* 146*    No results found for this or any previous visit (from the past 240 hour(s)).   Scheduled Meds: . antiseptic  oral rinse  15 mL Mouth Rinse BID  . carbamazepine  300 mg Oral BID  . hydrOXYzine  25 mg Oral QHS  . insulin aspart  0-9 Units Subcutaneous TID WC  . metoprolol succinate  12.5 mg Oral QPM  . pantoprazole  40 mg Oral BID  . Warfarin - Pharmacist Dosing Inpatient   Does not apply q1800  . zonisamide  100 mg Oral BID   Continuous Infusions:    Gilles Chiquito, MD  [facility] Pager [phone]  If 7PM-7AM, please contact night-coverage [email or web address] Password [email or web address] 04/06/2014, 7:12 AM   LOS: 4 days

## 2014-04-07 ENCOUNTER — Encounter: Payer: Self-pay | Admitting: Gastroenterology

## 2014-04-09 ENCOUNTER — Encounter: Payer: Self-pay | Admitting: Internal Medicine

## 2014-04-09 ENCOUNTER — Ambulatory Visit: Payer: Medicare Other | Attending: Internal Medicine

## 2014-04-09 ENCOUNTER — Ambulatory Visit (HOSPITAL_BASED_OUTPATIENT_CLINIC_OR_DEPARTMENT_OTHER): Payer: Medicare Other | Admitting: Internal Medicine

## 2014-04-09 VITALS — BP 114/73 | HR 73 | Temp 98.2°F | Resp 16 | Ht 70.0 in | Wt 214.0 lb

## 2014-04-09 DIAGNOSIS — Z87891 Personal history of nicotine dependence: Secondary | ICD-10-CM | POA: Diagnosis not present

## 2014-04-09 DIAGNOSIS — I4891 Unspecified atrial fibrillation: Secondary | ICD-10-CM

## 2014-04-09 LAB — POCT INR: INR: 1.2

## 2014-04-09 NOTE — Patient Instructions (Signed)
Please take 5 mg tonight 04/09/2014, 5 mg 04/10/2014 been 2-1/2 mg 04/11/2014. Come to the clinic July 20 for INR check.   Warfarin: What You Need to Know Warfarin is an anticoagulant. Anticoagulants help prevent the formation of blood clots. They also help stop the growth of blood clots. Warfarin is sometimes referred to as a "blood thinner."  Normally, when body tissues are cut or damaged, the blood clots in order to prevent blood loss. Sometimes clots form inside your blood vessels and obstruct the flow of blood through your circulatory system (thrombosis). These clots may travel through your bloodstream and become lodged in smaller blood vessels in your brain, which can cause a stroke, or in your lungs (pulmonary embolism). WHO SHOULD USE WARFARIN? Warfarin is prescribed for people at risk of developing harmful blood clots:  People with surgically implanted mechanical heart valves, irregular heart rhythms called atrial fibrillation, and certain clotting disorders.  People who have developed harmful blood clotting in the past, including those who have had a stroke or a pulmonary embolism, or thrombosis in their legs (deep vein thrombosis [DVT]).  People with an existing blood clot, such as a pulmonary embolism. WARFARIN DOSING Warfarin tablets come in different strengths. Each tablet strength is a different color, with the amount of warfarin (in milligrams) clearly printed on the tablet. If the color of your tablet is different than usual when you receive a new prescription, report it immediately to your pharmacist or health care provider. WARFARIN MONITORING The goal of warfarin therapy is to lessen the clotting tendency of blood but not prevent clotting completely. Your health care provider will monitor the anticoagulation effect of warfarin closely and adjust your dose as needed. For your safety, blood tests called prothrombin time (PT) or international normalized ratio (INR) are used to  measure the effects of warfarin. Both of these tests can be done with a finger stick or a blood draw. The longer it takes the blood to clot, the higher the PT or INR. Your health care provider will inform you of your "target" PT or INR range. If, at any time, your PT or INR is above the target range, there is a risk of bleeding. If your PT or INR is below the target range, there is a risk of clotting. Whether you are started on warfarin while you are in the hospital or in your health care provider's office, you will need to have your PT or INR checked within one week of starting the medicine. Initially, some people are asked to have their PT or INR checked as much as twice a week. Once you are on a stable maintenance dose, the PT or INR is checked less often, usually once every 2 to 4 weeks. The warfarin dose may be adjusted if the PT or INR is not within the target range. It is important to keep all laboratory and health care provider follow-up appointments. Not keeping appointments could result in a chronic or permanent injury, pain, or disability because warfarin is a medicine that requires close monitoring. WHAT ARE THE SIDE EFFECTS OF WARFARIN?  Too much warfarin can cause bleeding (hemorrhage) from any part of the body. This may include bleeding from the gums, blood in the urine, bloody or dark stools, a nosebleed that is not easily stopped, coughing up blood, or vomiting blood.  Too little warfarin can increase the risk of blood clots.  Too little or too much warfarin can also increase the risk of a stroke.  Warfarin  use may cause a skin rash or irritation, an unusual fever, continual nausea or stomach upset, or severe pain in your joints or back. SPECIAL PRECAUTIONS WHILE TAKING WARFARIN Warfarin should be taken exactly as directed. It is very important to take warfarin as directed since bleeding or blood clots could result in chronic or permanent injury, pain, or disability.  Take your  medicine at the same time every day. If you forget to take your dose, you can take it if it is within 6 hours of when it was due.  Do not change the dose of warfarin on your own to make up for missed or extra doses.  If you miss more than 2 doses in a row, you should contact your health care provider for advice. Avoid situations that cause bleeding. You may have a tendency to bleed more easily than usual while taking warfarin. The following actions can limit bleeding:  Using a softer toothbrush.  Flossing with waxed floss rather than unwaxed floss.  Shaving with an Copy rather than a blade.  Limiting the use of sharp objects.  Avoiding potentially harmful activities, such as contact sports. Warfarin and Pregnancy or Breastfeeding  Warfarin is not advised during the first trimester of pregnancy due to an increased risk of birth defects. In certain situations, a woman may take warfarin after her first trimester of pregnancy. A woman who becomes pregnant or plans to become pregnant while taking warfarin should notify her health care provider immediately.  Although warfarin does not pass into breast milk, a woman who wishes to breastfeed while taking warfarin should also consult with her health care provider. Alcohol, Smoking, and Illicit Drug Use  Alcohol affects how warfarin works in the body. It is best to avoid alcoholic drinks or consume very small amounts while taking warfarin. In general, alcohol intake should be limited to 1 oz (30 mL) of liquor, 6 oz (180 mL) of wine, or 12 oz (360 mL) of beer each day. Notify your health care provider if you change your alcohol intake.  Smoking affects how warfarin works. It is best to avoid smoking while taking warfarin. Notify your health care provider if you change your smoking habits.  It is best to avoid all illicit drugs while taking warfarin since there are few studies that show how warfarin interacts with these drugs. Other  Medicines and Dietary Supplements Many prescription and over-the-counter medicines can interfere with warfarin. Be sure all of your health care providers know you are taking warfarin. Notify your health care provider who prescribed warfarin for you or your pharmacist before starting or stopping any new medicines, including over-the-counter vitamins, dietary supplements, and pain medicines. Your warfarin dose may need to be adjusted. Some common over-the-counter medicines that may increase the risk of bleeding while taking warfarin include:   Acetaminophen.  Aspirin.  Nonsteroidal anti-inflammatory medicines (NSAIDs), such as ibuprofen or naproxen.  Vitamin E. Dietary Considerations  Foods that have moderate or high amounts of vitamin K can interfere with warfarin. Avoid major changes in your diet or notify your health care provider before changing your diet. Eat a consistent amount of foods that have moderate or high amounts of vitamin K. Eating less foods containing vitamin K can increase the risk of bleeding. Eating more foods containing vitamin K can increase the risk of blood clots. Additional questions about dietary considerations can be discussed with a dietitian. Foods that are very high in vitamin K:  Greens, such as Swiss chard and beet, collard,  mustard, or turnip greens (fresh or frozen, cooked).  Kale (fresh or frozen, cooked).  Parsley (raw).  Spinach (cooked). Foods that are high in vitamin K:  Asparagus (frozen, cooked).  Beans, green (frozen, cooked).  Broccoli.  Bok choy (cooked).  Brussels sprouts (fresh or frozen, cooked).  Cabbage (cooked).   Coleslaw. Foods that are moderately high in vitamin K:  Blueberries.  Black-eyed peas.  Endive (raw).  Green leaf lettuce (raw).  Green scallions (raw).  Kale (raw).  Okra (frozen, cooked).  Plantains (fried).  Romaine lettuce (raw).  Sauerkraut (canned).  Spinach (raw). CALL YOUR CLINIC OR HEALTH  CARE PROVIDER IF YOU:  Plan to have any surgery or procedure.  Feel sick, especially if you have diarrhea or vomiting.  Experience or anticipate any major changes in your diet.  Start or stop a prescription or over-the-counter medicine.  Become, plan to become, or think you may be pregnant.  Are having heavier than usual menstrual periods.  Have had a fall, accident, or any symptoms of bleeding or unusual bruising.  Develop an unusual fever. CALL 911 IN THE [place] OR GO TO THE EMERGENCY DEPARTMENT IF YOU:   Think you may be having an allergic reaction to warfarin. The signs of an allergic reaction could include itching, rash, hives, swelling, chest tightness, or trouble breathing.  See signs of blood in your urine. The signs could include reddish, pinkish, or tea-colored urine.  See signs of blood in your stools. The signs could include bright red or black stools.  Vomit or cough up blood. In these instances, the blood could have either a bright red or a "coffee-grounds" appearance.  Have bleeding that will not stop after applying pressure for 30 minutes such as cuts, nosebleeds, or other injuries.  Have severe pain in your joints or back.  Have a new and severe headache.  Have sudden weakness or numbness of your face, arm, or leg, especially on one side of your body.  Have sudden confusion or trouble understanding.  Have sudden trouble seeing in one or both eyes.  Have sudden trouble walking, dizziness, loss of balance, or coordination.  Have trouble speaking or understanding (aphasia). Document Released: 09/10/2005 Document Revised: 09/15/2013 Document Reviewed: 03/06/2013 [facility] Patient Information 2015 [place], [place]. This information is not intended to replace advice given to you by your health care provider. Make sure you discuss any questions you have with your health care provider.

## 2014-04-09 NOTE — Addendum Note (Signed)
Addended by: Robbie Lis on: 04/09/2014 10:03 AM   Modules accepted: Orders

## 2014-04-09 NOTE — Progress Notes (Signed)
Patient ID: Andrew [name], male   DOB: 1934/09/16, 80 y.o.   MRN: [id number]  CC: Followup after recent hospitalization  HPI: 80 year old male with multiple medical comorbidities including but not limited to atrial fibrillation on anticoagulation with Coumadin. He was recently hospitalized for GI bleed. At the time of discharge it was determined it was safe to put him back on Coumadin. He presents to the clinic for followup. He has no complaints of bleeding at this time.  Allergies  Allergen Reactions  . Depakote [Divalproex Sodium] Rash  . Dilantin [Phenytoin Sodium Extended] Rash  . Phenobarbital Rash   Past Medical History  Diagnosis Date  . Cancer   . Hypertension   . Diabetes mellitus without complication   . Seizures   . Dysrhythmia   . GI bleed 04/02/2014   Current Outpatient Prescriptions on File Prior to Visit  Medication Sig Dispense Refill  . aspirin EC 81 MG tablet Take 81 mg by mouth 2 (two) times daily.      . carbamazepine (CARBATROL) 300 MG 12 hr capsule Take 300 mg by mouth 2 (two) times daily.      . hydrOXYzine (ATARAX/VISTARIL) 25 MG tablet Take 25 mg by mouth at bedtime.      . metFORMIN (GLUCOPHAGE) 500 MG tablet Take 1 tablet (500 mg total) by mouth as directed.      . metoprolol succinate (TOPROL-XL) 25 MG 24 hr tablet Take 12.5 mg by mouth every evening.      . warfarin (COUMADIN) 2.5 MG tablet Take 1 tablet (2.5 mg total) by mouth daily at 6 PM.  30 tablet  0  . zonisamide (ZONEGRAN) 100 MG capsule Take 100 mg by mouth 2 (two) times daily.      . hydrocortisone 2.5 % cream Apply 1 application topically every hour as needed (itching).      Marland Kitchen PRESCRIPTION MEDICATION Place 1 drop into the left eye every evening.       No current facility-administered medications on file prior to visit.   Family History  Problem Relation Age of Onset  . Diabetes Mellitus II Mother   . Prostate cancer Father   . Diabetes Mellitus II Brother    History   Social History  .  Marital Status: Married    Spouse Name: N/A    Number of Children: N/A  . Years of Education: N/A   Occupational History  . Not on file.   Social History Main Topics  . Smoking status: Former Smoker    Quit date: 09/24/1969  . Smokeless tobacco: Never Used  . Alcohol Use: No  . Drug Use: No  . Sexual Activity: Not on file   Other Topics Concern  . Not on file   Social History Narrative  . No narrative on file    Review of Systems  Constitutional: Negative for fever, chills, diaphoresis, activity change, appetite change and fatigue.  HENT: Negative for ear pain, nosebleeds, congestion, facial swelling, rhinorrhea, neck pain, neck stiffness and ear discharge.   Eyes: Negative for pain, discharge, redness, itching and visual disturbance.  Respiratory: Negative for cough, choking, chest tightness, shortness of breath, wheezing and stridor.   Cardiovascular: Negative for chest pain, palpitations and leg swelling.  Gastrointestinal: Negative for abdominal distention.  Genitourinary: Negative for dysuria, urgency, frequency, hematuria, flank pain, decreased urine volume, difficulty urinating and dyspareunia.  Musculoskeletal: Negative for back pain, joint swelling, arthralgias and gait problem.  Neurological: Negative for dizziness, tremors, seizures, syncope, facial asymmetry, speech  difficulty, weakness, light-headedness, numbness and headaches.  Hematological: Negative for adenopathy. Does not bruise/bleed easily.  Psychiatric/Behavioral: Negative for hallucinations, behavioral problems, confusion, dysphoric mood, decreased concentration and agitation.    Objective:   Filed Vitals:   04/09/14 0948  BP: 114/73  Pulse: 73  Temp: 98.2 F (36.8 C)  Resp: 16    Physical Exam  Constitutional: Appears well-developed and well-nourished. No distress.  HENT: Normocephalic. External right and left ear normal. Oropharynx is clear and moist.  Eyes: Conjunctivae and EOM are normal.  PERRLA, no scleral icterus.  Neck: Normal ROM. Neck supple. No JVD. No tracheal deviation. No thyromegaly.  CVS:S1/S2 +, no murmurs, no gallops, no carotid bruit.  Pulmonary: Effort and breath sounds normal, no stridor, rhonchi, wheezes, rales.  Abdominal: Soft. BS +,  no distension, tenderness, rebound or guarding.  Musculoskeletal: Normal range of motion. No edema and no tenderness.  Lymphadenopathy: No lymphadenopathy noted, cervical, inguinal. Neuro: Alert. Normal reflexes, muscle tone coordination. No cranial nerve deficit. Skin: Skin is warm and dry. No rash noted. Not diaphoretic. No erythema. No pallor.  Psychiatric: Normal mood and affect. Behavior, judgment, thought content normal.   Lab Results  Component Value Date   WBC 6.6 04/06/2014   HGB 9.3* 04/06/2014   HCT 28.2* 04/06/2014   MCV 95.9 04/06/2014   PLT 133* 04/06/2014   Lab Results  Component Value Date   CREATININE 1.30 04/04/2014   BUN 18 04/04/2014   NA 145 04/04/2014   K 4.5 04/04/2014   CL 107 04/04/2014   CO2 24 04/04/2014    No results found for this basename: HGBA1C   Lipid Panel  No results found for this basename: chol, trig, hdl, cholhdl, vldl, ldlcalc       Assessment and plan:   Patient Active Problem List   Diagnosis Date Noted  . Atrial fibrillation - INR today 1.2 - pt instructed to take 5 mg tablet for 2 days and then 2.5 on Sunday  - Patient instructed to come back to the clinic on Monday to recheck INR.  - Patient usually follows at the [facility] and he will continue going there this is just followup after hospitalization.  04/02/2014

## 2014-04-09 NOTE — Progress Notes (Signed)
Pt comes in to establish care for INR secondary to Atrial Fibrillation Pt was d/c'd from [facility] hospital Tuesday s/p rectal bleeding INR 4.0 [facility], only here for INR and coumadin dosing Denies bleeding or pain at this time Taking medications daily INR- 1.2 Taking 2.5 mg Warfarin

## 2014-04-12 ENCOUNTER — Ambulatory Visit: Payer: Non-veteran care | Attending: Internal Medicine

## 2014-04-12 DIAGNOSIS — I82409 Acute embolism and thrombosis of unspecified deep veins of unspecified lower extremity: Secondary | ICD-10-CM

## 2014-04-12 NOTE — Progress Notes (Unsigned)
Pt here to recheck INR s/p Atrial Fibrillation  Pt is taking alternate 2.5 mg/5 mg tablet INR- 1.1 pt denies bleeding

## 2014-04-12 NOTE — Patient Instructions (Signed)
Take 2 tablets tonight and tomorrow and return Wednesday for recheck

## 2014-04-14 ENCOUNTER — Ambulatory Visit: Payer: Non-veteran care | Attending: Internal Medicine

## 2014-04-14 DIAGNOSIS — I4891 Unspecified atrial fibrillation: Secondary | ICD-10-CM

## 2014-04-14 LAB — POCT INR: INR: 1.5

## 2014-04-14 NOTE — Patient Instructions (Signed)
Take 10 mg tablets tonight and tomorrow then 7.5 mg Friday-Sunday and keep appointment for next adjustment

## 2016-10-02 DIAGNOSIS — R296 Repeated falls: Secondary | ICD-10-CM | POA: Diagnosis not present

## 2016-10-02 DIAGNOSIS — I2510 Atherosclerotic heart disease of native coronary artery without angina pectoris: Secondary | ICD-10-CM | POA: Diagnosis not present

## 2016-10-02 DIAGNOSIS — R531 Weakness: Secondary | ICD-10-CM | POA: Diagnosis not present

## 2016-10-02 DIAGNOSIS — R2689 Other abnormalities of gait and mobility: Secondary | ICD-10-CM | POA: Diagnosis not present

## 2016-10-02 DIAGNOSIS — E119 Type 2 diabetes mellitus without complications: Secondary | ICD-10-CM | POA: Diagnosis not present

## 2016-10-02 DIAGNOSIS — M21372 Foot drop, left foot: Secondary | ICD-10-CM | POA: Diagnosis not present

## 2016-10-02 DIAGNOSIS — M4800 Spinal stenosis, site unspecified: Secondary | ICD-10-CM | POA: Diagnosis not present

## 2016-10-02 DIAGNOSIS — M25572 Pain in left ankle and joints of left foot: Secondary | ICD-10-CM | POA: Diagnosis not present

## 2016-10-02 DIAGNOSIS — N183 Chronic kidney disease, stage 3 (moderate): Secondary | ICD-10-CM | POA: Diagnosis not present

## 2016-10-02 DIAGNOSIS — I129 Hypertensive chronic kidney disease with stage 1 through stage 4 chronic kidney disease, or unspecified chronic kidney disease: Secondary | ICD-10-CM | POA: Diagnosis not present

## 2016-10-02 DIAGNOSIS — I482 Chronic atrial fibrillation: Secondary | ICD-10-CM | POA: Diagnosis not present

## 2017-06-18 ENCOUNTER — Observation Stay (HOSPITAL_COMMUNITY)
Admission: EM | Admit: 2017-06-18 | Discharge: 2017-06-20 | Disposition: A | Discharge status: Home / Self Care | Payer: Medicare Other | Attending: Internal Medicine | Admitting: Internal Medicine

## 2017-06-18 ENCOUNTER — Emergency Department (HOSPITAL_BASED_OUTPATIENT_CLINIC_OR_DEPARTMENT_OTHER)
Admit: 2017-06-18 | Discharge: 2017-06-18 | Disposition: A | Discharge status: Home / Self Care | Payer: Medicare Other | Attending: Emergency Medicine | Admitting: Emergency Medicine

## 2017-06-18 ENCOUNTER — Encounter (HOSPITAL_COMMUNITY): Payer: Self-pay | Admitting: Neurology

## 2017-06-18 ENCOUNTER — Emergency Department (HOSPITAL_COMMUNITY): Payer: Medicare Other

## 2017-06-18 DIAGNOSIS — M79662 Pain in left lower leg: Secondary | ICD-10-CM

## 2017-06-18 DIAGNOSIS — M7989 Other specified soft tissue disorders: Secondary | ICD-10-CM | POA: Diagnosis not present

## 2017-06-18 DIAGNOSIS — Z23 Encounter for immunization: Secondary | ICD-10-CM | POA: Diagnosis not present

## 2017-06-18 DIAGNOSIS — N183 Chronic kidney disease, stage 3 (moderate): Secondary | ICD-10-CM | POA: Diagnosis not present

## 2017-06-18 DIAGNOSIS — I481 Persistent atrial fibrillation: Secondary | ICD-10-CM

## 2017-06-18 DIAGNOSIS — Z87891 Personal history of nicotine dependence: Secondary | ICD-10-CM | POA: Diagnosis not present

## 2017-06-18 DIAGNOSIS — Z79899 Other long term (current) drug therapy: Secondary | ICD-10-CM | POA: Insufficient documentation

## 2017-06-18 DIAGNOSIS — M79609 Pain in unspecified limb: Secondary | ICD-10-CM | POA: Diagnosis not present

## 2017-06-18 DIAGNOSIS — G40909 Epilepsy, unspecified, not intractable, without status epilepticus: Secondary | ICD-10-CM | POA: Diagnosis not present

## 2017-06-18 DIAGNOSIS — Z85038 Personal history of other malignant neoplasm of large intestine: Secondary | ICD-10-CM | POA: Diagnosis not present

## 2017-06-18 DIAGNOSIS — D649 Anemia, unspecified: Secondary | ICD-10-CM | POA: Diagnosis not present

## 2017-06-18 DIAGNOSIS — K469 Unspecified abdominal hernia without obstruction or gangrene: Secondary | ICD-10-CM

## 2017-06-18 DIAGNOSIS — E669 Obesity, unspecified: Secondary | ICD-10-CM | POA: Diagnosis not present

## 2017-06-18 DIAGNOSIS — Z7901 Long term (current) use of anticoagulants: Secondary | ICD-10-CM | POA: Diagnosis not present

## 2017-06-18 DIAGNOSIS — M79605 Pain in left leg: Secondary | ICD-10-CM

## 2017-06-18 DIAGNOSIS — Z9181 History of falling: Secondary | ICD-10-CM | POA: Diagnosis not present

## 2017-06-18 DIAGNOSIS — X58XXXA Exposure to other specified factors, initial encounter: Secondary | ICD-10-CM | POA: Diagnosis not present

## 2017-06-18 DIAGNOSIS — I129 Hypertensive chronic kidney disease with stage 1 through stage 4 chronic kidney disease, or unspecified chronic kidney disease: Secondary | ICD-10-CM | POA: Insufficient documentation

## 2017-06-18 DIAGNOSIS — T148XXA Other injury of unspecified body region, initial encounter: Secondary | ICD-10-CM

## 2017-06-18 DIAGNOSIS — E1122 Type 2 diabetes mellitus with diabetic chronic kidney disease: Secondary | ICD-10-CM | POA: Diagnosis not present

## 2017-06-18 DIAGNOSIS — M7981 Nontraumatic hematoma of soft tissue: Secondary | ICD-10-CM | POA: Diagnosis not present

## 2017-06-18 DIAGNOSIS — Z7984 Long term (current) use of oral hypoglycemic drugs: Secondary | ICD-10-CM | POA: Diagnosis not present

## 2017-06-18 DIAGNOSIS — S8012XA Contusion of left lower leg, initial encounter: Secondary | ICD-10-CM | POA: Diagnosis not present

## 2017-06-18 DIAGNOSIS — Z8042 Family history of malignant neoplasm of prostate: Secondary | ICD-10-CM

## 2017-06-18 DIAGNOSIS — Z888 Allergy status to other drugs, medicaments and biological substances status: Secondary | ICD-10-CM | POA: Diagnosis not present

## 2017-06-18 DIAGNOSIS — Y9289 Other specified places as the place of occurrence of the external cause: Secondary | ICD-10-CM | POA: Insufficient documentation

## 2017-06-18 DIAGNOSIS — D539 Nutritional anemia, unspecified: Secondary | ICD-10-CM

## 2017-06-18 DIAGNOSIS — Z833 Family history of diabetes mellitus: Secondary | ICD-10-CM

## 2017-06-18 HISTORY — DX: Unspecified atrial fibrillation: I48.91

## 2017-06-18 LAB — PROTIME-INR
INR: 2.03
PROTHROMBIN TIME: 22.8 s — AB (ref 11.4–15.2)

## 2017-06-18 LAB — URINALYSIS, ROUTINE W REFLEX MICROSCOPIC
Bilirubin Urine: NEGATIVE
Glucose, UA: NEGATIVE mg/dL
Hgb urine dipstick: NEGATIVE
Ketones, ur: NEGATIVE mg/dL
Leukocytes, UA: NEGATIVE
Nitrite: NEGATIVE
Protein, ur: NEGATIVE mg/dL
Specific Gravity, Urine: 1.018 (ref 1.005–1.030)
pH: 5 (ref 5.0–8.0)

## 2017-06-18 LAB — BASIC METABOLIC PANEL
Anion gap: 7 (ref 5–15)
BUN: 17 mg/dL (ref 6–20)
CHLORIDE: 108 mmol/L (ref 101–111)
CO2: 23 mmol/L (ref 22–32)
Calcium: 8.9 mg/dL (ref 8.9–10.3)
Creatinine, Ser: 1.45 mg/dL — ABNORMAL HIGH (ref 0.61–1.24)
GFR calc non Af Amer: 44 mL/min — ABNORMAL LOW (ref >60)
GFR, EST AFRICAN AMERICAN: 51 mL/min — AB (ref >60)
Glucose, Bld: 160 mg/dL — ABNORMAL HIGH (ref 65–99)
Potassium: 4.3 mmol/L (ref 3.5–5.1)
SODIUM: 138 mmol/L (ref 135–145)

## 2017-06-18 LAB — CK: CK TOTAL: 37 U/L — AB (ref 49–397)

## 2017-06-18 LAB — CBC
HCT: 29.5 % — ABNORMAL LOW (ref 39.0–52.0)
Hemoglobin: 9.4 g/dL — ABNORMAL LOW (ref 13.0–17.0)
MCH: 30.6 pg (ref 26.0–34.0)
MCHC: 31.9 g/dL (ref 30.0–36.0)
MCV: 96.1 fL (ref 78.0–100.0)
PLATELETS: 169 10*3/uL (ref 150–400)
RBC: 3.07 MIL/uL — ABNORMAL LOW (ref 4.22–5.81)
RDW: 15.6 % — ABNORMAL HIGH (ref 11.5–15.5)
WBC: 6.4 10*3/uL (ref 4.0–10.5)

## 2017-06-18 MED ORDER — ACETAMINOPHEN 325 MG PO TABS
650.0000 mg | ORAL_TABLET | Freq: Four times a day (QID) | ORAL | Status: DC | PRN
  Administered 2017-06-18: 650 mg via ORAL
  Filled 2017-06-18: qty 2

## 2017-06-18 MED ORDER — FENTANYL CITRATE (PF) 100 MCG/2ML IJ SOLN
50.0000 ug | Freq: Once | INTRAMUSCULAR | Status: AC
  Administered 2017-06-18: 50 ug via INTRAVENOUS
  Filled 2017-06-18: qty 2

## 2017-06-18 MED ORDER — INSULIN ASPART 100 UNIT/ML ~~LOC~~ SOLN
0.0000 [IU] | Freq: Three times a day (TID) | SUBCUTANEOUS | Status: DC
  Administered 2017-06-19: 2 [IU] via SUBCUTANEOUS
  Administered 2017-06-19 (×2): 1 [IU] via SUBCUTANEOUS
  Administered 2017-06-20: 2 [IU] via SUBCUTANEOUS
  Administered 2017-06-20: 1 [IU] via SUBCUTANEOUS

## 2017-06-18 MED ORDER — FENTANYL CITRATE (PF) 100 MCG/2ML IJ SOLN
100.0000 ug | Freq: Once | INTRAMUSCULAR | Status: AC
  Administered 2017-06-18: 100 ug via INTRAVENOUS
  Filled 2017-06-18: qty 2

## 2017-06-18 MED ORDER — IOPAMIDOL (ISOVUE-300) INJECTION 61%
INTRAVENOUS | Status: AC
  Administered 2017-06-18: 100 mL
  Filled 2017-06-18: qty 100

## 2017-06-18 NOTE — ED Notes (Signed)
Spoke to admitting about pt request for pain medication

## 2017-06-18 NOTE — Consult Note (Signed)
ORTHOPAEDIC CONSULTATION  REQUESTING PHYSICIAN: Bartholomew Crews, MD  PCP:  System, Pcp Not In  Chief Complaint: Left calf hematoma  HPI: Andrew [name] is a 83 y.o. male who complains of Increasing left lower knee pain over the last 1-2 weeks.  He states the pain has increased steadily to the point of not being able to bear weight without the assistance of a cane or walker.  He now is at the point where he is unable to comfortably bear weight or walk at all.  He does have a history of frequent falls as well as a history of atrial fibrillation treated with rate control and Coumadin, epilepsy, chronic kidney disease stage III, as well as remote history of colon cancer.  He denies any trauma to the lower leg, he denies any numbness, tingling, or motor deficits of the lower leg.  He denies pain with motion of the foot or ankle.  His workup in the emergency department included ultrasound which was negative for DVT.  He had a CT scan that did confirm a intramuscular left calf hematoma within the gastrocsoleus.  Laboratory data showed INR of 2.06 with a normal white cell count as well as inflammatory markers.  Past Medical History:  Diagnosis Date  . A-fib ([place])   . Cancer ([place])   . Diabetes mellitus without complication ([place])   . Dysrhythmia   . GI bleed 04/02/2014  . Hypertension   . Seizures ([place])    Past Surgical History:  Procedure Laterality Date  . APPENDECTOMY    . [place]  . COLON SURGERY    . COLONOSCOPY N/A 04/05/2014   Procedure: COLONOSCOPY;  Surgeon: Inda Castle, MD;  Location: [facility];  Service: Endoscopy;  Laterality: N/A;   Social History   Social History  . Marital status: Married    Spouse name: N/A  . Number of children: N/A  . Years of education: N/A   Social History Main Topics  . Smoking status: Former Smoker    Quit date: 09/24/1969  . Smokeless tobacco: Never Used  . Alcohol use No  . Drug use: No  . Sexual activity: Not  Asked   Other Topics Concern  . None   Social History Narrative  . None   Family History  Problem Relation Age of Onset  . Diabetes Mellitus II Mother   . Prostate cancer Father   . Diabetes Mellitus II Brother    Allergies  Allergen Reactions  . Tegretol [Carbamazepine] Other (See Comments)    Made patient's feet swell  . Depakote [Divalproex Sodium] Rash  . Dilantin [Phenytoin Sodium Extended] Rash  . Phenobarbital Rash   Prior to Admission medications   Medication Sig Start Date End Date Taking? Authorizing Provider  Cholecalciferol (VITAMIN D-3) 1000 units CAPS Take 1,000 Units by mouth daily.   Yes [provider]  hydrocortisone cream 1 % Apply 1 application topically every 4 (four) hours.   Yes [provider]  metoprolol tartrate (LOPRESSOR) 25 MG tablet Take 12.5 mg by mouth at bedtime.    Yes [provider]  Multiple Vitamins-Minerals (ONE-A-DAY MENS 50+ ADVANTAGE) TABS Take 1 tablet by mouth at bedtime.   Yes [provider]  vitamin B-12 (CYANOCOBALAMIN) 1000 MCG tablet Take 1,000 mcg by mouth daily.   Yes [provider]  warfarin (COUMADIN) 5 MG tablet Take 7.5-10 mg by mouth See admin instructions. 7.5 mg at bedtime on Sun/Tues/Wed/Fri and 10 mg on Mon/Thurs/Sat  Yes [provider]  zonisamide (ZONEGRAN) 100 MG capsule Take 300 mg by mouth 2 (two) times daily. MORNING and EVENING   Yes [provider]  metFORMIN (GLUCOPHAGE) 500 MG tablet Take 1 tablet (500 mg total) by mouth as directed. Patient taking differently: Take 500 mg by mouth at bedtime.  04/06/14   Sid Falcon, MD  warfarin (COUMADIN) 2.5 MG tablet Take 1 tablet (2.5 mg total) by mouth daily at 6 PM. Patient not taking: Reported on 06/18/2017 04/06/14   Sid Falcon, MD   Ct Tibia Fibula Left W Contrast  Result Date: 06/18/2017 CLINICAL DATA:  Severe leg pain, swelling, bruising, discoloration concerning for hematoma in lower leg. Pt  reports pain, weakness of left leg for 1 week. EXAM: CT OF THE LOWER RIGHT EXTREMITY WITH CONTRAST TECHNIQUE: Multidetector CT imaging of the lower right extremity was performed according to the standard protocol following intravenous contrast administration. COMPARISON:  None. CONTRAST:  147mL ISOVUE-300 IOPAMIDOL (ISOVUE-300) INJECTION 61% FINDINGS: Bones/Joint/Cartilage No fracture or dislocation. Normal alignment. No joint effusion. Generalized osteopenia. Moderate medial femorotibial compartment joint space narrowing. Mild lateral patellofemoral compartment joint space narrowing and marginal osteophytes. Mild subtalar joint osteoarthritis. Mild osteoarthritis of the tibiotalar joint. Moderate osteoarthritis of the talonavicular joint. Tiny plantar calcaneal spur. Ligaments Ligaments are suboptimally evaluated by CT. Muscles and Tendons Visualized flexor, extensor, peroneal and Achilles tendons are intact. Patellar tendon and quadriceps tendon are intact. Soft tissue Intramuscular hematoma in the left gastrocnemius muscle with ill-defined margins making it difficult to accurately measure, but measures approximately 6.1 x 4.2 x 6.2 cm. No soft tissue mass. Soft tissue edema in the subcutaneous fat of the left lower leg. IMPRESSION: 1. Intramuscular hematoma in the left gastrocnemius muscle. Electronically Signed   By: Kathreen Devoid   On: 06/18/2017 12:44    Positive ROS: All other systems have been reviewed and were otherwise negative with the exception of those mentioned in the HPI and as above.  Physical Exam: General: Alert, no acute distress Cardiovascular: Irregularly irregular rhythm. Respiratory: No cyanosis, no use of accessory musculature GI: No organomegaly, abdomen is soft and non-tender Skin: No lesions in the area of chief complaint Neurologic: Sensation intact distally Psychiatric: Patient is competent for consent with normal mood and affect Lymphatic: No axillary or cervical  lymphadenopathy  MUSCULOSKELETAL:   Left lower extremity exam:  He has swelling noted from about the tibial tubercle down to the foot.  Cath is swollen but compressible without any increasing pain on passive stretch.There is pitting edema up to about the mid tibia.  He has some yellowish discoloration of the skin of the calf.  He endorses sensation intact to light touch in the deep and superficial peroneal nerve, sural nerve, saphenous nerve, tibial nerve.  He is able to move his toes and ankle up and down without pain.  Negative pain on passive stretch.  1+ dorsalis pedis pulse.    Assessment: Left Gastrocnemius intramuscular hematoma  Plan: - There are no clinical or laboratory signs for infection this time.  This appears to be a sterile hematoma.  Likewise he has no signs on examination concerning for any compartment syndrome. - He is okay for full weightbearing as tolerated to the left leg.  There is no indication for operative treatment or management at this time. - I would recommend evaluation by physical therapy for assessment and discharge/dispositional planning. - Recommend ice, elevation, and compression wrapping of the left leg to help resolve the hematoma.  Nicholes Stairs, MD Cell [phone]    06/18/2017 8:42 PM

## 2017-06-18 NOTE — ED Provider Notes (Signed)
[facility] DEPT Provider Note   CSN: [id number] Arrival date & time: 06/18/17  0741     History   Chief Complaint Chief Complaint  Patient presents with  . Leg Pain    HPI Andrew [name] is a 83 y.o. male.     The history is provided by the patient, a relative and medical records.  Leg Pain   This is a new problem. The current episode started more than 2 days ago. The problem occurs constantly. The problem has been gradually worsening. The pain is present in the left lower leg. The pain is at a severity of 10/10. The pain is severe. Pertinent negatives include no numbness, full range of motion, no tingling and no itching. He has tried nothing for the symptoms. The treatment provided no relief. There has been no history of extremity trauma.    Past Medical History:  Diagnosis Date  . A-fib ([place])   . Cancer ([place])   . Diabetes mellitus without complication ([place])   . Dysrhythmia   . GI bleed 04/02/2014  . Hypertension   . Seizures [facility])     Patient Active Problem List   Diagnosis Date Noted  . Diverticulosis of colon with hemorrhage 04/05/2014  . Benign neoplasm of colon 04/05/2014  . Internal hemorrhoids without mention of complication 96/29/5284  . Chronic kidney disease, stage 3, mod decreased GFR 04/03/2014  . Thrombocytopenia, unspecified ([place]) 04/03/2014  . Acute blood loss anemia 04/03/2014  . GI bleed 04/02/2014  . Atrial fibrillation ([place]) 04/02/2014  . History of colon cancer 04/02/2014  . Seizure ([place]) 04/02/2014    Past Surgical History:  Procedure Laterality Date  . APPENDECTOMY    . [place]  . COLON SURGERY    . COLONOSCOPY N/A 04/05/2014   Procedure: COLONOSCOPY;  Surgeon: Inda Castle, MD;  Location: [facility];  Service: Endoscopy;  Laterality: N/A;       Home Medications    Prior to Admission medications   Medication Sig Start Date End Date Taking? Authorizing Provider  aspirin EC 81 MG tablet Take 81 mg by mouth  2 (two) times daily.    [provider]  carbamazepine (CARBATROL) 300 MG 12 hr capsule Take 300 mg by mouth 2 (two) times daily.    [provider]  hydrocortisone 2.5 % cream Apply 1 application topically every hour as needed (itching).    [provider]  hydrOXYzine (ATARAX/VISTARIL) 25 MG tablet Take 25 mg by mouth at bedtime.    [provider]  metFORMIN (GLUCOPHAGE) 500 MG tablet Take 1 tablet (500 mg total) by mouth as directed. 04/06/14   Sid Falcon, MD  metoprolol succinate (TOPROL-XL) 25 MG 24 hr tablet Take 12.5 mg by mouth every evening.    [provider]  PRESCRIPTION MEDICATION Place 1 drop into the left eye every evening.    [provider]  warfarin (COUMADIN) 2.5 MG tablet Take 1 tablet (2.5 mg total) by mouth daily at 6 PM. 04/06/14   Sid Falcon, MD  zonisamide (ZONEGRAN) 100 MG capsule Take 100 mg by mouth 2 (two) times daily.    [provider]    Family History Family History  Problem Relation Age of Onset  . Diabetes Mellitus II Mother   . Prostate cancer Father   . Diabetes Mellitus II Brother     Social History Social History  Substance Use Topics  . Smoking status: Former Smoker    Quit date:  09/24/1969  . Smokeless tobacco: Never Used  . Alcohol use No     Allergies   Depakote [divalproex sodium]; Dilantin [phenytoin sodium extended]; and Phenobarbital   Review of Systems Review of Systems  Constitutional: Negative for chills, diaphoresis, fatigue and fever.  HENT: Negative for congestion and rhinorrhea.   Eyes: Negative for visual disturbance.  Respiratory: Negative for cough, chest tightness, shortness of breath, wheezing and stridor.   Cardiovascular: Positive for leg swelling. Negative for chest pain and palpitations.  Gastrointestinal: Negative for abdominal pain, constipation, diarrhea, nausea and vomiting.  Genitourinary: Negative for flank pain and frequency.    Musculoskeletal: Negative for back pain, neck pain and neck stiffness.  Skin: Negative for itching, rash and wound.  Neurological: Negative for dizziness, tingling, weakness, light-headedness, numbness and headaches.  Psychiatric/Behavioral: Negative for agitation and confusion.  All other systems reviewed and are negative.    Physical Exam Updated Vital Signs BP 105/79 (BP Location: Right Arm)   Pulse 83   Temp 98.4 F (36.9 C) (Oral)   Resp 18   SpO2 100%   Physical Exam  Constitutional: He is oriented to person, place, and time. He appears well-developed and well-nourished. No distress.  HENT:  Head: Normocephalic.  Mouth/Throat: Oropharynx is clear and moist. No oropharyngeal exudate.  Eyes: Pupils are equal, round, and reactive to light. Conjunctivae and EOM are normal.  Neck: Normal range of motion.  Cardiovascular: Normal rate and intact distal pulses.   No murmur heard. Pulmonary/Chest: Effort normal and breath sounds normal. No respiratory distress. He has no rales. He exhibits no tenderness.  Abdominal: Bowel sounds are normal. There is no tenderness.  Musculoskeletal: He exhibits edema and tenderness.       Left knee: Tenderness found.       Left lower leg: He exhibits tenderness, swelling and edema.       Legs: Doppler pulses and and sensation normal in bilateral legs.   Neurological: He is alert and oriented to person, place, and time. No cranial nerve deficit or sensory deficit. He exhibits normal muscle tone.  Skin: Skin is warm. Capillary refill takes less than 2 seconds. He is not diaphoretic. No erythema. No pallor.  Psychiatric: He has a normal mood and affect.  Nursing note and vitals reviewed.    ED Treatments / Results  Labs (all labs ordered are listed, but only abnormal results are displayed) Labs Reviewed  BASIC METABOLIC PANEL - Abnormal; Notable for the following:       Result Value   Glucose, Bld 160 (*)    Creatinine, Ser 1.45 (*)    GFR  calc non Af Amer 44 (*)    GFR calc Af Amer 51 (*)    All other components within normal limits  CBC - Abnormal; Notable for the following:    RBC 3.07 (*)    Hemoglobin 9.4 (*)    HCT 29.5 (*)    RDW 15.6 (*)    All other components within normal limits  PROTIME-INR - Abnormal; Notable for the following:    Prothrombin Time 22.8 (*)    All other components within normal limits  CK - Abnormal; Notable for the following:    Total CK 37 (*)    All other components within normal limits  CBC - Abnormal; Notable for the following:    RBC 3.06 (*)    Hemoglobin 9.4 (*)    HCT 29.4 (*)    RDW 15.6 (*)    All other components  within normal limits  COMPREHENSIVE METABOLIC PANEL - Abnormal; Notable for the following:    Glucose, Bld 143 (*)    Creatinine, Ser 1.42 (*)    Calcium 8.7 (*)    Total Protein 6.4 (*)    ALT 13 (*)    Total Bilirubin 1.4 (*)    GFR calc non Af Amer 45 (*)    GFR calc Af Amer 52 (*)    All other components within normal limits  GLUCOSE, CAPILLARY - Abnormal; Notable for the following:    Glucose-Capillary 143 (*)    All other components within normal limits  GLUCOSE, CAPILLARY - Abnormal; Notable for the following:    Glucose-Capillary 148 (*)    All other components within normal limits  GLUCOSE, CAPILLARY - Abnormal; Notable for the following:    Glucose-Capillary 193 (*)    All other components within normal limits  GLUCOSE, CAPILLARY - Abnormal; Notable for the following:    Glucose-Capillary 127 (*)    All other components within normal limits  URINALYSIS, ROUTINE W REFLEX MICROSCOPIC  PROTIME-INR  BASIC METABOLIC PANEL    EKG  EKG Interpretation  Date/Time:  Tuesday June 18 2017 08:08:09 EDT Ventricular Rate:  96 PR Interval:    QRS Duration: 98 QT Interval:  364 QTC Calculation: 459 R Axis:   92 Text Interpretation:  Atrial fibrillation with premature ventricular or aberrantly conducted complexes Rightward axis Low voltage QRS  Cannot rule out Anterior infarct , age undetermined Abnormal ECG When compared to prior, t wave inversions in leads 2, 3, V5, V6. Multiple PVC , Afib.  No STEMI Confirmed by Antony Blackbird [id number]) on 06/18/2017 9:29:11 AM       Radiology Ct Tibia Fibula Left W Contrast  Result Date: 06/18/2017 CLINICAL DATA:  Severe leg pain, swelling, bruising, discoloration concerning for hematoma in lower leg. Pt reports pain, weakness of left leg for 1 week. EXAM: CT OF THE LOWER RIGHT EXTREMITY WITH CONTRAST TECHNIQUE: Multidetector CT imaging of the lower right extremity was performed according to the standard protocol following intravenous contrast administration. COMPARISON:  None. CONTRAST:  124mL ISOVUE-300 IOPAMIDOL (ISOVUE-300) INJECTION 61% FINDINGS: Bones/Joint/Cartilage No fracture or dislocation. Normal alignment. No joint effusion. Generalized osteopenia. Moderate medial femorotibial compartment joint space narrowing. Mild lateral patellofemoral compartment joint space narrowing and marginal osteophytes. Mild subtalar joint osteoarthritis. Mild osteoarthritis of the tibiotalar joint. Moderate osteoarthritis of the talonavicular joint. Tiny plantar calcaneal spur. Ligaments Ligaments are suboptimally evaluated by CT. Muscles and Tendons Visualized flexor, extensor, peroneal and Achilles tendons are intact. Patellar tendon and quadriceps tendon are intact. Soft tissue Intramuscular hematoma in the left gastrocnemius muscle with ill-defined margins making it difficult to accurately measure, but measures approximately 6.1 x 4.2 x 6.2 cm. No soft tissue mass. Soft tissue edema in the subcutaneous fat of the left lower leg. IMPRESSION: 1. Intramuscular hematoma in the left gastrocnemius muscle. Electronically Signed   By: Kathreen Devoid   On: 06/18/2017 12:44    Procedures Procedures (including critical care time)  Medications Ordered in ED Medications  metoprolol tartrate (LOPRESSOR) tablet 12.5 mg (12.5 mg  Oral Given 06/19/17 0128)  zonisamide (ZONEGRAN) capsule 300 mg (300 mg Oral Given 06/19/17 0917)  sodium chloride flush (NS) 0.9 % injection 3 mL (3 mLs Intravenous Not Given 06/19/17 1108)  acetaminophen (TYLENOL) tablet 650 mg (650 mg Oral Given 06/19/17 1101)    Or  acetaminophen (TYLENOL) suppository 650 mg ( Rectal See Alternative 06/19/17 1101)  senna-docusate (Senokot-S) tablet 1  tablet (not administered)  promethazine (PHENERGAN) tablet 12.5 mg (not administered)  insulin aspart (novoLOG) injection 0-9 Units (1 Units Subcutaneous Given 06/19/17 1737)  Influenza vac split quadrivalent PF (FLUZONE HIGH-DOSE) injection 0.5 mL (not administered)  pneumococcal 23 valent vaccine (PNU-IMMUNE) injection 0.5 mL (not administered)  Warfarin - Pharmacist Dosing Inpatient ( Does not apply Given 06/19/17 1800)  hydrocortisone cream 1 % 1 application (not administered)  HYDROcodone-acetaminophen (NORCO/VICODIN) 5-325 MG per tablet 1 tablet (not administered)  fentaNYL (SUBLIMAZE) injection 50 mcg (50 mcg Intravenous Given 06/18/17 1129)  iopamidol (ISOVUE-300) 61 % injection (100 mLs  Contrast Given 06/18/17 1150)  fentaNYL (SUBLIMAZE) injection 100 mcg (100 mcg Intravenous Given 06/18/17 1510)  warfarin (COUMADIN) tablet 10 mg (10 mg Oral Given 06/19/17 1737)     Initial Impression / Assessment and Plan / ED Course  I have reviewed the triage vital signs and the nursing notes.  Pertinent labs & imaging results that were available during my care of the patient were reviewed by me and considered in my medical decision making (see chart for details).     CARON TARDIF is a 83 y.o. male with a past medical history significant for atrial fibrillation on Coumadin therapy, prior GI bleed, chronic kidney disease, and seizures who presents with left leg pain and swelling. Patient is accompanied by family. Patient says that for the last week he has had gradually worsening left leg pain, swelling, and  discoloration. Patient describes that he had no traumatic injuries to his knowledge. He says that he was sitting "in the outdoors" and started having left leg discomfort. He says this is new. He said he noticed some bruising behind his left knee and his left ankle. He then started having swelling in his calf and shin. He says that he has had no numbness or tingling but the pain is now unbearable. He says that he is unable to bear any weight or walk. He denies history of DVT or PE. He denies any bug bites or skin injuries. He denies any history of severe cellulitis.  On exam, patient's left lower leg is tense and large. It is edematous. Patient had DP and PT pulses that were found by Doppler bilaterally. Patient has normal sensation in bilateral feet. Patient is able to wiggle his toes bilaterally. Normal capillary refill. Patient has pain with any palpation in the posterior calf or shin. Patient's leg is warm without erythema. Patient had the ecchymoses seen in the popliteal fossa and the posterior ankle. Patient is able to move his ankle in both directions. His vital exam is otherwise unremarkable.  Based on left leg swelling, patient had ultrasound which showed no evidence of DVT or baker's cyst. Given the bruising and discoloration with his left shin being yellow, there is concern for a balding bruising or hematoma that is hidden.  Decision made to obtain lab testing to look for infection as well as obtaining CT imaging to look for and hematoma.  Laboratory testing results are seen above. Pt's is Coumadin was therapeutic at 2.03. CBC showed similar anemia prior. BMP showed slightly worsening creatinine but otherwise unchanged. No evidence of UTI.  Her decision-making conversation held about contrast hurting his kidneys however both he and family want to determine the cause of his symptoms. Patient then had CT scan.  CT scan revealed intramuscular gastrocnemius hematoma of the proximal is 6 x 4 x 6 cm.  This is likely the cause of his discoloration, pain, and swelling.   Although it  cannot be determined the patient has an underlying infection of the hematoma, his laboratory testing does not show this at this time.   Physical therapy was called for evaluation as he reports he cannot bear any weight or walk despite multiple doses of pain medicine in the ED. Physical therapy attempted to walk him and felt he was not safe for home. They feel he needs a 24 skilled nursing facility at minimum.  Due to the patient's worsening symptoms, patient will need observation to watch for developing a compartment syndrome with a worsening hematoma, development of infection in the intramuscular hematoma that is not easily assessed by inspection, and further PT/OT evaluation with pain management. Patient will be called for admission given these concerns.    Final Clinical Impressions(s) / ED Diagnoses   Final diagnoses:  Intramuscular hematoma  Left leg pain  Pain of left calf     Clinical Impression: 1. Intramuscular hematoma   2. Left leg pain   3. Pain of left calf     Disposition: Admit to Internal Medicine    [place] Depaz, Gwenyth Allegra, MD 06/19/17 2137

## 2017-06-18 NOTE — Evaluation (Signed)
Physical Therapy Evaluation Patient Details Name: Andrew [name] MRN: [id number] DOB: 12/04/1933 Today's Date: 06/18/2017   History of Present Illness  Pt is an 83 y/o male admitted secondary to worsening weakness in LLE. CT revealed hematoma in L gastroc muscle. Imaging negative for DVT. PMH includes colon cancer, a fib, seizures, DM, HTN, and multiple falls.   Clinical Impression  Pt presenting to ED with problem above and deficits below. PTA, pt was independent using cane, however, in the last couple of days, has not been able to walk secondary to increased LLE pain and weakness. Upon eval, pt limited by pain. Required max A to stand, and unable to fully weight bear on LLE secondary to pain. Also difficulty with moving LLE when attempting to take a step. Pt is currently a high fall risk, so recommending SNF at d/c to increase independence with functional mobility and safety. Educated pt and family and they are agreeable. Will continue to follow acutely to maximize functional mobility independence and safety.     Follow Up Recommendations SNF;Supervision/Assistance - 24 hour    Equipment Recommendations  Wheelchair (measurements PT);Wheelchair cushion (measurements PT)    Recommendations for Other Services       Precautions / Restrictions Precautions Precautions: Fall Precaution Comments: Pt's daughter reports multiple falls, however, pt unable to recall.  Restrictions Weight Bearing Restrictions: No Other Position/Activity Restrictions: Per MD, PT is WBAT      Mobility  Bed Mobility Overal bed mobility: Needs Assistance Bed Mobility: Supine to Sit;Sit to Supine     Supine to sit: Mod assist Sit to supine: Min assist   General bed mobility comments: Mod A for trunk elevation and Min a for LLE management during bed mobility.   Transfers Overall transfer level: Needs assistance Equipment used: 1 person hand held assist;Straight cane Transfers: Sit to/from Stand            General transfer comment: Max A to stand. Unable to come up to full standing and left LLE positioned anteriorly due to inability to bear weight. Pt wanting to attempt to take steps, however, unable secondary to decreased safety and LLE pain.   Ambulation/Gait             General Gait Details: Unsafe to attempt at this time.   Stairs            Wheelchair Mobility    Modified Rankin (Stroke Patients Only)       Balance Overall balance assessment: Needs assistance Sitting-balance support: No upper extremity supported;Feet unsupported Sitting balance-Leahy Scale: Fair     Standing balance support: Bilateral upper extremity supported Standing balance-Leahy Scale: Poor Standing balance comment: Reliant on UE and external support to maintain standing.                              Pertinent Vitals/Pain Pain Assessment: 0-10 Pain Score: 10-Worst pain ever Pain Location: L calf Pain Descriptors / Indicators: Aching;Heaviness Pain Intervention(s): Limited activity within patient's tolerance;Monitored during session;Repositioned    Home Living Family/patient expects to be discharged to:: Private residence Living Arrangements: Spouse/significant other Available Help at Discharge: Family;Available 24 hours/day (wife unable to physically assist) Type of Home: House Home Access: Ramped entrance     [place] - single point;Shower seat;Walker - 2 wheels      Prior Function Level of Independence: Independent with assistive device(s)         Comments:  Required use of cane, however, unable to ambulate here lately.      Hand Dominance        Extremity/Trunk Assessment   Upper Extremity Assessment Upper Extremity Assessment: Generalized weakness    Lower Extremity Assessment Lower Extremity Assessment: LLE deficits/detail;Generalized weakness LLE Deficits / Details: Difficulty lifting LLE during functional  mobility. Unable to bear full weight on LLE during mobility.     Cervical / Trunk Assessment Cervical / Trunk Assessment: Kyphotic  Communication   Communication: No difficulties  Cognition Arousal/Alertness: Awake/alert Behavior During Therapy: WFL for tasks assessed/performed Overall Cognitive Status: Impaired/Different from baseline Area of Impairment: Memory;Safety/judgement                     Memory: Decreased short-term memory   Safety/Judgement: Decreased awareness of safety;Decreased awareness of deficits     General Comments: Pt's daughter reports pt is forgetting when he falls. Reports to me that he had not had any falls, however, family reports he has had multiple falls. Pt also presenting with decreased safety awareness and wanting to use rolling table to hold on to during mobility.       General Comments General comments (skin integrity, edema, etc.): Pt's daughter in room. Educated about need for SNF and pt and pt's daughter agreeable.     Exercises     Assessment/Plan    PT Assessment Patient needs continued PT services  PT Problem List Decreased strength;Decreased activity tolerance;Decreased balance;Decreased mobility;Decreased cognition;Decreased knowledge of use of DME;Decreased safety awareness;Decreased knowledge of precautions;Pain       PT Treatment Interventions DME instruction;Stair training;Gait training;Functional mobility training;Therapeutic activities;Therapeutic exercise;Balance training;Neuromuscular re-education;Cognitive remediation;Patient/family education    PT Goals (Current goals can be found in the Care Plan section)  Acute Rehab PT Goals Patient Stated Goal: to decrease pain  PT Goal Formulation: With patient Time For Goal Achievement: 07/02/17 Potential to Achieve Goals: Fair    Frequency Min 2X/week   Barriers to discharge Decreased caregiver support Pt's wife unable to physically assist pt.    Co-evaluation                AM-PAC PT "6 Clicks" Daily Activity  Outcome Measure Difficulty turning over in bed (including adjusting bedclothes, sheets and blankets)?: A Lot Difficulty moving from lying on back to sitting on the side of the bed? : Unable Difficulty sitting down on and standing up from a chair with arms (e.g., wheelchair, bedside commode, etc,.)?: Unable Help needed moving to and from a bed to chair (including a wheelchair)?: Total Help needed walking in hospital room?: Total Help needed climbing 3-5 steps with a railing? : Total 6 Click Score: 7    End of Session Equipment Utilized During Treatment: Gait belt Activity Tolerance: Patient limited by pain Patient left: in bed;with call bell/phone within reach;with family/visitor present Nurse Communication: Mobility status PT Visit Diagnosis: Other abnormalities of gait and mobility (R26.89);Pain;Muscle weakness (generalized) (M62.81) Pain - Right/Left: Left Pain - part of body: Leg (calf )    Time: 2725-3664 PT Time Calculation (min) (ACUTE ONLY): 23 min   Charges:   PT Evaluation $PT Eval Moderate Complexity: 1 Mod PT Treatments $Therapeutic Activity: 8-22 mins   PT G Codes:   PT G-Codes **NOT FOR INPATIENT CLASS** Functional Assessment Tool Used: AM-PAC 6 Clicks Basic Mobility;Clinical judgement Functional Limitation: Mobility: Walking and moving around Mobility: Walking and Moving Around Current Status (Q0347): At least 80 percent but less than 100 percent impaired, limited or restricted Mobility:  Walking and Moving Around Goal Status [phone]): At least 40 percent but less than 60 percent impaired, limited or restricted    Leighton Ruff, PT, DPT  Acute Rehabilitation Services  Pager: [place] 06/18/2017, 2:58 PM

## 2017-06-18 NOTE — ED Notes (Signed)
IV attempted x2 unsuccessful IV team consulted 

## 2017-06-18 NOTE — ED Notes (Signed)
Patient transported to CT 

## 2017-06-18 NOTE — Progress Notes (Signed)
*  PRELIMINARY RESULTS* Vascular Ultrasound Left lower ext venous duplex has been completed.  Preliminary findings: No evidence of DVT or baker's cyst.    Landry Mellow, RDMS, RVT  06/18/2017, 9:55 AM

## 2017-06-18 NOTE — ED Notes (Signed)
Ordered heart healthy

## 2017-06-18 NOTE — ED Notes (Signed)
Pt back from CT

## 2017-06-18 NOTE — ED Triage Notes (Addendum)
Pt reports pain, weakness to Left leg x 1 week. When he walks, he drags his leg, and he is scared he might fall. Swelling to left leg x 1 week, is painful. Denies other deficits. Is a x 4. Denies CP or SOB. LLE is warm to touch, swollen, and tight. Reports coumadin for afib.

## 2017-06-18 NOTE — H&P (Signed)
Date: 06/18/2017               Patient Name:  Andrew [name] MRN: [id number]  DOB: [birth year] Age / Sex: 83 y.o., male   PCP: System, Pcp Not In         Medical Service: Internal Medicine Teaching Service         Attending Physician: Dr. Lynnae January, Real Cons, MD    First Contact: Dr. Lurlean Horns  Pager: [phone]  Second Contact: Dr. Tiburcio Pea  Pager: [phone]       After Hours (After 5p/  First Contact Pager: [phone]  weekends / holidays): Second Contact Pager: [phone]   Chief Complaint: Left leg pain   History of Present Illness:  Andrew [name] 83 year old male with history of Afib on Coumadin, epilepsy, CKD stage III, a history of colorectal cancer 20 years ago presents to the ED with left leg pain that started 1 week ago and was found to have LLE intramuscular hematoma. Patient accompanied by daughter. Patient states he was in his usual state of health until 1 week ago  when he started to experience left lower extremity pain associated with weakness and difficulty ambulating. Pain has progressively worsened over the past week and he is now unable to stand on his own.  He uses a cane and a walker at baseline for ambulation. Has not tried anything for the pain. Reports a history of frequent falls but states his last fall was 2 months ago. Denies recent trauma to left lower extremity. Last seizure was 6 months ago. Denies fever, chills, chest pain, palpitations, shortness of breath, abdominal pain, N/V, and changes in bowel movements.  ED course:  Patient was hemodynamically stable on arrival to the ED. Lab remarkable for anemia with Hgb of 9.4 (chronic in nature), Cr 1.45 (unclear baseline), and INR 2.03. UA clean. Patient becomes tachycardic to 118 when speaking or moving. Ortho was informally consulted and recommended wrapping of LLE. He received Fentanyl x2 for pain.    Review of Systems: A complete ROS was negative except as per HPI.     Meds:  Current Meds  Medication Sig    . Cholecalciferol (VITAMIN D-3) 1000 units CAPS Take 1,000 Units by mouth daily.  . hydrocortisone cream 1 % Apply 1 application topically every 4 (four) hours.  . metoprolol tartrate (LOPRESSOR) 25 MG tablet Take 12.5 mg by mouth at bedtime.   . Multiple Vitamins-Minerals (ONE-A-DAY MENS 50+ ADVANTAGE) TABS Take 1 tablet by mouth at bedtime.  . vitamin B-12 (CYANOCOBALAMIN) 1000 MCG tablet Take 1,000 mcg by mouth daily.  Marland Kitchen warfarin (COUMADIN) 5 MG tablet Take 7.5-10 mg by mouth See admin instructions. 7.5 mg at bedtime on Sun/Tues/Wed/Fri and 10 mg on Mon/Thurs/Sat  . zonisamide (ZONEGRAN) 100 MG capsule Take 300 mg by mouth 2 (two) times daily. MORNING and EVENING     Allergies: Allergies as of 06/18/2017 - Review Complete 06/18/2017  Allergen Reaction Noted  . Tegretol [carbamazepine] Other (See Comments) 06/18/2017  . Depakote [divalproex sodium] Rash 04/01/2014  . Dilantin [phenytoin sodium extended] Rash 04/01/2014  . Phenobarbital Rash 04/01/2014   Past Medical History:  Diagnosis Date  . A-fib ([place])   . Cancer ([place])   . Diabetes mellitus without complication ([place])   . Dysrhythmia   . GI bleed 04/02/2014  . Hypertension   . Seizures ([place])     Family History:  Family History  Problem Relation Age of Onset  . Diabetes Mellitus II Mother   .  Prostate cancer Father   . Diabetes Mellitus II Brother     Social History:  Social History  Substance Use Topics  . Smoking status: Former Smoker    Quit date: 09/24/1969  . Smokeless tobacco: Never Used  . Alcohol use No    Physical Exam: Blood pressure 121/81, pulse 87, temperature 98.4 F (36.9 C), temperature source Oral, resp. rate 18, SpO2 99 %.  General: pleasant male, well-nourished, well-developed, no distress Cardiac: irregularly irregular rate and rhythm, nl S1/S2, no murmurs, rubs or gallops  Pulm: CTAB, no wheezes or crackles, no increased work of breathing  Abd: soft, NTND, bowel sounds present, prominent  left abdominal hernia noted, not incarcerated  Neuro: A&Ox3, able to move all 4 extremities and no focal deficits noted LLE: there is 2+ edema with yellow discoloration below the knee to the toes, extremity is warm and well perfused, unable to palpate distal pulses due to edema but 2+ with Doppler ultrasound. There is significant tenderness over left calf RLE: warm and well perfused, no peripheral edema, 2+ DP pulse Derm: left lower extremity with yellow discoloration from knee to toes, there is significant red/violacious bruising on the posterior left knee and left ankle   EKG: atrial fibrillation with HR and 96 bpm, right axis deviation, normal intervals, Q waves on the V1 and T-wave inversion in leads II, III, and V5, V6, no signs of acute ischemia noted  CT L tibia and fibula:  Intramuscular hematoma in the left gastrocnemius muscle with ill-defined margins making it difficult to accurately measure, but measures approximately 6.1 x 4.2 x 6.2 cm. No soft tissue mass. Soft tissue edema in the subcutaneous fat of the left lower leg   Assessment & Plan by Problem:  Andrew [name] 83 year old male with history of Afib on Coumadin, epilepsy, CKD stage III, a history of colorectal cancer 20 years ago presents to the ED with left leg pain and found to have an intramuscular hematoma of gastrocnemius muscle on CT.  # L gastrocnemius hematoma: Found on CT. No recent trauma. ?Secondary to warfarin. On exam, LLE is swollen and grossly yellow, but neurovascularly intact. Unclear if patient is actively bleeding in to LLE as his Hgb is at baseline. Low suspicion for compartment syndrome at this time, but will continue to monitor. He was evaluated by PT in the ED and recommended SNF and wheelchair.  - Ortho consulted, appreciate recommendations  - Hold home warfarin today. Plan to restart tomorrow if Hgb is stable  - Neurovascular checks  - F/u CBC and CK   # Atrial fibrillation: rate controlled on  metoprolol. Tachycardic at times when seen in the ED. Low suspicion for PE at this time as he is not short of breath and therapeutic INR. Will continue to monitor.  - Continue home metoprolol 12.5 mg QD  - Holding warfarin as above   # Epilepsy: last seizure 6 months ago  - Continue home Zonegran 300 mg BID   # CKD Stage III: unknown baseline Hgb. Cr 1.45. Will continue to monitor Cr as patient received IV contrast for MRI. Will hydrate with IVF if worsening renal function.  - Will continue to monitor renal function  - Avoid nephrotoxic medications   # T2DM/?Insulin resistance: Patient on metformin 500 mg QD at home. No A1c in results review or care everywhere. Unclear when patient was started on metformin.  - Hold home metformin  - SSI-S   F: None  E: will continue to monitor  N:  Renal diet   VTE ppx: will hold today, resume home warfarin tomorrow if Hgb stable   Code status: Full code, confirmed on admission     Dispo: Admit patient to Observation with expected length of stay less than 2 midnights.  Signed: Welford Roche, MD  Internal Medicine PGY-1  P [phone]

## 2017-06-19 DIAGNOSIS — Z7901 Long term (current) use of anticoagulants: Secondary | ICD-10-CM | POA: Diagnosis not present

## 2017-06-19 DIAGNOSIS — Z85038 Personal history of other malignant neoplasm of large intestine: Secondary | ICD-10-CM | POA: Diagnosis not present

## 2017-06-19 DIAGNOSIS — G40909 Epilepsy, unspecified, not intractable, without status epilepticus: Secondary | ICD-10-CM | POA: Diagnosis not present

## 2017-06-19 DIAGNOSIS — M7981 Nontraumatic hematoma of soft tissue: Secondary | ICD-10-CM

## 2017-06-19 DIAGNOSIS — E669 Obesity, unspecified: Secondary | ICD-10-CM | POA: Diagnosis not present

## 2017-06-19 DIAGNOSIS — E1122 Type 2 diabetes mellitus with diabetic chronic kidney disease: Secondary | ICD-10-CM | POA: Diagnosis not present

## 2017-06-19 DIAGNOSIS — Z79899 Other long term (current) drug therapy: Secondary | ICD-10-CM | POA: Diagnosis not present

## 2017-06-19 DIAGNOSIS — Z7984 Long term (current) use of oral hypoglycemic drugs: Secondary | ICD-10-CM | POA: Diagnosis not present

## 2017-06-19 DIAGNOSIS — I481 Persistent atrial fibrillation: Secondary | ICD-10-CM | POA: Diagnosis not present

## 2017-06-19 DIAGNOSIS — N183 Chronic kidney disease, stage 3 (moderate): Secondary | ICD-10-CM | POA: Diagnosis not present

## 2017-06-19 LAB — COMPREHENSIVE METABOLIC PANEL
ALBUMIN: 3.5 g/dL (ref 3.5–5.0)
ALT: 13 U/L — ABNORMAL LOW (ref 17–63)
ANION GAP: 8 (ref 5–15)
AST: 16 U/L (ref 15–41)
Alkaline Phosphatase: 62 U/L (ref 38–126)
BUN: 19 mg/dL (ref 6–20)
CO2: 23 mmol/L (ref 22–32)
Calcium: 8.7 mg/dL — ABNORMAL LOW (ref 8.9–10.3)
Chloride: 106 mmol/L (ref 101–111)
Creatinine, Ser: 1.42 mg/dL — ABNORMAL HIGH (ref 0.61–1.24)
GFR calc Af Amer: 52 mL/min — ABNORMAL LOW (ref >60)
GFR calc non Af Amer: 45 mL/min — ABNORMAL LOW (ref >60)
GLUCOSE: 143 mg/dL — AB (ref 65–99)
Potassium: 4.2 mmol/L (ref 3.5–5.1)
SODIUM: 137 mmol/L (ref 135–145)
Total Bilirubin: 1.4 mg/dL — ABNORMAL HIGH (ref 0.3–1.2)
Total Protein: 6.4 g/dL — ABNORMAL LOW (ref 6.5–8.1)

## 2017-06-19 LAB — GLUCOSE, CAPILLARY
GLUCOSE-CAPILLARY: 127 mg/dL — AB (ref 65–99)
Glucose-Capillary: 143 mg/dL — ABNORMAL HIGH (ref 65–99)
Glucose-Capillary: 148 mg/dL — ABNORMAL HIGH (ref 65–99)
Glucose-Capillary: 193 mg/dL — ABNORMAL HIGH (ref 65–99)
Glucose-Capillary: 131 mg/dL — ABNORMAL HIGH (ref 65–99)

## 2017-06-19 LAB — CBC
HCT: 29.4 % — ABNORMAL LOW (ref 39.0–52.0)
HEMOGLOBIN: 9.4 g/dL — AB (ref 13.0–17.0)
MCH: 30.7 pg (ref 26.0–34.0)
MCHC: 32 g/dL (ref 30.0–36.0)
MCV: 96.1 fL (ref 78.0–100.0)
Platelets: 173 10*3/uL (ref 150–400)
RBC: 3.06 MIL/uL — ABNORMAL LOW (ref 4.22–5.81)
RDW: 15.6 % — AB (ref 11.5–15.5)
WBC: 7.5 10*3/uL (ref 4.0–10.5)

## 2017-06-19 MED ORDER — WARFARIN SODIUM 5 MG PO TABS
10.0000 mg | ORAL_TABLET | Freq: Once | ORAL | Status: AC
  Administered 2017-06-19: 10 mg via ORAL
  Filled 2017-06-19: qty 2

## 2017-06-19 MED ORDER — SODIUM CHLORIDE 0.9% FLUSH
3.0000 mL | Freq: Two times a day (BID) | INTRAVENOUS | Status: DC
  Administered 2017-06-19 – 2017-06-20 (×3): 3 mL via INTRAVENOUS

## 2017-06-19 MED ORDER — HYDROCODONE-ACETAMINOPHEN 5-325 MG PO TABS: 1.0000 | ORAL_TABLET | Freq: Three times a day (TID) | ORAL | Status: DC | PRN

## 2017-06-19 MED ORDER — SENNOSIDES-DOCUSATE SODIUM 8.6-50 MG PO TABS: 1.0000 | ORAL_TABLET | Freq: Every evening | ORAL | Status: DC | PRN

## 2017-06-19 MED ORDER — ZONISAMIDE 100 MG PO CAPS
300.0000 mg | ORAL_CAPSULE | Freq: Two times a day (BID) | ORAL | Status: DC
  Administered 2017-06-19 – 2017-06-20 (×4): 300 mg via ORAL
  Filled 2017-06-19 (×4): qty 3

## 2017-06-19 MED ORDER — TRAMADOL HCL 50 MG PO TABS: 50.0000 mg | ORAL_TABLET | Freq: Two times a day (BID) | ORAL | Status: DC

## 2017-06-19 MED ORDER — INFLUENZA VAC SPLIT HIGH-DOSE 0.5 ML IM SUSY
0.5000 mL | PREFILLED_SYRINGE | INTRAMUSCULAR | Status: AC
  Administered 2017-06-20: 0.5 mL via INTRAMUSCULAR
  Filled 2017-06-19: qty 0.5

## 2017-06-19 MED ORDER — PNEUMOCOCCAL VAC POLYVALENT 25 MCG/0.5ML IJ INJ
0.5000 mL | INJECTION | INTRAMUSCULAR | Status: AC
  Administered 2017-06-20: 0.5 mL via INTRAMUSCULAR
  Filled 2017-06-19: qty 0.5

## 2017-06-19 MED ORDER — ACETAMINOPHEN 325 MG PO TABS
650.0000 mg | ORAL_TABLET | Freq: Four times a day (QID) | ORAL | Status: DC | PRN
  Administered 2017-06-19 – 2017-06-20 (×4): 650 mg via ORAL
  Filled 2017-06-19 (×4): qty 2

## 2017-06-19 MED ORDER — WARFARIN SODIUM 2.5 MG PO TABS: 2.5000 mg | ORAL_TABLET | Freq: Once | ORAL | Status: DC

## 2017-06-19 MED ORDER — HYDROCORTISONE 1 % EX CREA
1.0000 "application " | TOPICAL_CREAM | Freq: Three times a day (TID) | CUTANEOUS | Status: DC | PRN
  Filled 2017-06-19: qty 28

## 2017-06-19 MED ORDER — METOPROLOL TARTRATE 12.5 MG HALF TABLET
12.5000 mg | ORAL_TABLET | Freq: Every day | ORAL | Status: DC
  Administered 2017-06-19 (×2): 12.5 mg via ORAL
  Filled 2017-06-19 (×2): qty 1

## 2017-06-19 MED ORDER — PROMETHAZINE HCL 25 MG PO TABS: 12.5000 mg | ORAL_TABLET | Freq: Four times a day (QID) | ORAL | Status: DC | PRN

## 2017-06-19 MED ORDER — WARFARIN - PHARMACIST DOSING INPATIENT
Freq: Every day | Status: DC
  Administered 2017-06-19: 18:00:00

## 2017-06-19 MED ORDER — SODIUM CHLORIDE 0.9 % IV SOLN
INTRAVENOUS | Status: DC
  Administered 2017-06-19: 01:00:00 via INTRAVENOUS

## 2017-06-19 MED ORDER — ACETAMINOPHEN 650 MG RE SUPP: 650.0000 mg | Freq: Four times a day (QID) | RECTAL | Status: DC | PRN

## 2017-06-19 NOTE — Progress Notes (Signed)
   Subjective:  No acute events overnight. Wife and daughter at bedside when patient was seen this morning. Continues to complain of pain in his left leg. Patient explained he will need 24-hour care while he recovers and agreeable to go to SNF. All questions were answered.  Objective:  Vital signs in last 24 hours: Vitals:   06/18/17 2130 06/18/17 2300 06/19/17 0010 06/19/17 0432  BP: (!) 135/93 121/87 (!) 143/95 131/68  Pulse: 95 (!) 103 91 85  Resp: (!) 21 20 18    Temp:   98.6 F (37 C) 97.9 F (36.6 C)  TempSrc:   Oral Oral  SpO2: 98% 100% 97% 99%  Weight:   195 lb 15.8 oz (88.9 kg)   Height:   5\' 10"  (1.778 m)    General: pleasant male, well-developed, lying bed in no acute distress Cardiac: regular rate and rhythm, nl S1/S2, no murmurs, rubs or gallops  Pulm: CTAB, no wheezes or crackles, no increased work of breathing  Abd: soft, NTND, bowel sounds present  Neuro: A&Ox3, able to move all 4 extremities LLE: extremity continues to be swollen and has yellow discoloration to it. Patient continues to experience pain on left calf. Warm and well-perfused.     Assessment/Plan:  Nelda Severe. Groleau 83 year old male with history of Afib on Coumadin, epilepsy, CKD stage III, a history of colorectal cancer 20 years ago presents to the ED with left leg pain and found to have an intramuscular hematoma of gastrocnemius muscle on CT.  # L gastrocnemius hematoma: Found on CT. No recent trauma. ?Secondary to warfarin. CK normal. Left lower extremity continues to be neurovascularly intact. Will restart home warfarin as Hgb remained stable at 9.4. Low suspicion for compartment syndrome at this time, but will continue to monitor. Per SW, he will go to SNF tomorrow 9/27.  - Continue RICE therapy  - Resume home warfarin + check INR  - Neurovascular checks  - Pain control: Norco 5 mg q8h PRN   # Atrial fibrillation: rate controlled on metoprolol  - Continue home metoprolol 12.5 mg QD  - Resume  warfarin as above    # Epilepsy: last seizure 6 months ago  - Continue home Zonegran 300 mg BID   # CKD Stage III: unknown baseline Hgb. Cr 1.45. Will continue to monitor Cr as patient received IV contrast for MRI. Will hydrate with IVF if worsening renal function.  - Will continue to monitor renal function  - Avoid nephrotoxic medications   # T2DM/?Insulin resistance: Patient on metformin 500 mg QD at home. No A1c in results review or care everywhere. Unclear when patient was started on metformin.  - Hold home metformin  - SSI-S   F: None  E: will continue to monitor  N: Renal diet   VTE ppx: home warfarin   Code status: Full code, confirmed on admission   Dispo: Anticipated discharge in approximately 1 day pending SNF placement.   Welford Roche, MD  Internal Medicine PGY-1  P [phone]

## 2017-06-19 NOTE — Progress Notes (Addendum)
ANTICOAGULATION CONSULT NOTE - Initial Consult  Pharmacy Consult for Warfarin Indication: atrial fibrillation  Allergies  Allergen Reactions  . Tegretol [Carbamazepine] Other (See Comments)    Made patient's feet swell  . Depakote [Divalproex Sodium] Rash  . Dilantin [Phenytoin Sodium Extended] Rash  . Phenobarbital Rash    Patient Measurements: Height: 5\' 10"  (177.8 cm) Weight: 195 lb 15.8 oz (88.9 kg) IBW/kg (Calculated) : 73  Vital Signs: Temp: 97.9 F (36.6 C) (09/26 0432) Temp Source: Oral (09/26 0432) BP: 131/68 (09/26 0432) Pulse Rate: 85 (09/26 0432)  Labs:  Recent Labs  06/18/17 0819 06/19/17 0444  HGB 9.4* 9.4*  HCT 29.5* 29.4*  PLT 169 173  LABPROT 22.8*  --   INR 2.03  --   CREATININE 1.45* 1.42*  CKTOTAL 37*  --     Estimated Creatinine Clearance: 45.8 mL/min (A) (by C-G formula based on SCr of 1.42 mg/dL (H)).   Medical History: Past Medical History:  Diagnosis Date  . A-fib ([place])   . Cancer ([place])   . Diabetes mellitus without complication ([place])   . Dysrhythmia   . GI bleed 04/02/2014  . Hypertension   . Seizures ([place])     Assessment: 67 yoM presenting with LLE hematoma, on warfarin PTA for afib. Warfarin held on admission for evaluation of hematoma, determined not to be actively bleeding. Pharmacy consulted to restart warfarin. INR 2.03 on admission, one dose of warfarin (7.5mg ) missed yesterday. No INR drawn this AM. Hgb 9.4 and pltc 173, stable. On heart healthy diet, no documented PO intake.   PTA warfarin dose: 7.5mg  PO on SuTWF, 10mg  PO on MRSa  Goal of Therapy:  INR 2-3 Monitor platelets by anticoagulation protocol: Yes   Plan:  Give warfarin 10mg  x1 today Daily INR  Monitor for s/sx of bleeding and thrombosis   Erin N. Gerarda Fraction, PharmD PGY1 Pharmacy Resident Pager: [phone]

## 2017-06-19 NOTE — Progress Notes (Signed)
  Date: 06/19/2017  Patient name: Andrew [name]  Medical record number: [id number]  Date of birth: 02/24/1934   I have seen and evaluated Andrew [name] and discussed their care with the Residency Team. Andrew [name] is an 83 year old man on warfarin for chronic A. fib who presents with pain and weakness to the left leg for 1 week. The pain has progressed over the past week to the point where he is no longer able to stand or walk. In the ED, there was concern for DVT and a lower extremity Doppler was obtained which was negative for DVT or Baker's cyst. The benefits and risk of CT scanning due to his chronic renal insufficiency were discussed and a CT was obtained which showed an intramuscular hematoma in the left gastroc muscle. His INR on admission was 2.03. His hemoglobin is 9.4 which is stable from 2015. He was evaluated by PT while in the ED and SNF was recommended as the patient was not able to bear weight on the left lower extremity due to pain. Orthopedics has evaluated the patient and feels this is a sterile hematoma without compartment syndrome. They have cleared him for weightbearing as tolerated and recommended symptomatic treatment. This morning, he continues to complain of left leg pain. He has not been up since he came to the hospital. His wife and his daughter are in the room and we discussed SNF placement. The consensus was that neither of them are capable of physically assisting Andrew [name] and so he is in agreement to go to SNF.  PMHx : A Fib, epilepsy, CKD Soc Hx : Married, lives in [place] home so has to use stairs to go from room to room  Vitals:   06/19/17 0010 06/19/17 0432  BP: (!) 143/95 131/68  Pulse: 91 85  Resp: 18   Temp: 98.6 F (37 C) 97.9 F (36.6 C)  SpO2: 97% 99%  NAD Hirr irr no MRG ABD obese,  + BS L LE + ecchymosis from posterior knee to foot, + edema, warm  I personally viewed the EKG and confirmed my reading with the official read. A Fib, LAD, lateral TWI  WBC  7.5 HgB 9.4 Plts 173 Cr 1.42  Assessment and Plan: I have seen and evaluated the patient as outlined above. I agree with the formulated Assessment and Plan as detailed in the residents' note, with the following changes: Andrew [name] is an 83 year old male on chronic warfarin for persistent A. fib. His INR is in the therapeutic range on admission but has been dx with a left gastroc hematoma with symptoms for the past week. He is now unable to bear weight on the left extremity and therefore is not able to ambulate. His family is not physically capable of assisting him at home.  1. Left gastrocnemius hematoma - there are no indications that he has an infected hematoma or compartment syndrome. His hemoglobin is stable indicating no further bleeding. It will be safe to resume his warfarin to maintain him in a therapeutic range of 2 - 3. Due to his inability to stand and ambulate, we will proceed with SNF placement which the patient and family are in agreement with.  2. Persistent A. Fib - he is rate controlled on his home dose of metoprolol. We will continue his warfarin with INR goal of 2 - 3.  He is medically stable for transfer to SNF once a bed is located.  Bartholomew Crews, MD 9/26/201812:42 PM

## 2017-06-19 NOTE — Progress Notes (Signed)
CSW was alerted that patient needed to leave due to Observation status. [facility] closed before DC Summary would be ready. Patient can discharge in the morning. MD aware.   Percell Locus Rosali Augello LCSWA [phone]

## 2017-06-19 NOTE — Progress Notes (Signed)
Physical Therapy Treatment Patient Details Name: Andrew [name] MRN: [id number] DOB: 1933-11-20 Today's Date: 06/19/2017    History of Present Illness Pt is an 83 y/o male admitted secondary to worsening weakness in LLE. CT revealed hematoma in L gastroc muscle. Imaging negative for DVT. PMH includes colon cancer, a fib, seizures, DM, HTN, and multiple falls.     PT Comments    Pt progressing well despite the pain.  Able to progress his gait with the RW today.    Follow Up Recommendations  SNF;Supervision/Assistance - 24 hour     Equipment Recommendations  Wheelchair (measurements PT);Wheelchair cushion (measurements PT)    Recommendations for Other Services       Precautions / Restrictions Precautions Precautions: Fall Restrictions Other Position/Activity Restrictions: Per MD, PT is WBAT    Mobility  Bed Mobility Overal bed mobility: Needs Assistance Bed Mobility: Supine to Sit     Supine to sit: Min guard     General bed mobility comments: slow and effortful with use of the rail, but no assist  Transfers Overall transfer level: Needs assistance   Transfers: Sit to/from Stand Sit to Stand: Min assist         General transfer comment: cues for hand placement, stability assist  Ambulation/Gait Ambulation/Gait assistance: Min guard Ambulation Distance (Feet): 60 Feet Assistive device: Rolling walker (2 wheeled) Gait Pattern/deviations: Step-to pattern Gait velocity: slow Gait velocity interpretation: Below normal speed for age/gender General Gait Details: step to pattern where R foot stays behind throughout in order to not stretch the L achilles   Stairs            Wheelchair Mobility    Modified Rankin (Stroke Patients Only)       Balance Overall balance assessment: Needs assistance   Sitting balance-Leahy Scale: Fair       Standing balance-Leahy Scale: Fair Standing balance comment: relying on the RW, but able to stand unassisted                            Cognition Arousal/Alertness: Awake/alert Behavior During Therapy: WFL for tasks assessed/performed Overall Cognitive Status: Within Functional Limits for tasks assessed (not tested formally.)                                        Exercises      General Comments        Pertinent Vitals/Pain Pain Assessment: Faces Faces Pain Scale: Hurts even more Pain Location: L calf Pain Descriptors / Indicators: Aching;Grimacing;Guarding;Sore Pain Intervention(s): Limited activity within patient's tolerance;Monitored during session    Home Living                      Prior Function            PT Goals (current goals can now be found in the care plan section) Acute Rehab PT Goals Patient Stated Goal: to decrease pain  PT Goal Formulation: With patient Time For Goal Achievement: 07/02/17 Potential to Achieve Goals: Fair Progress towards PT goals: Progressing toward goals    Frequency    Min 2X/week      PT Plan Current plan remains appropriate    Co-evaluation              AM-PAC PT "6 Clicks" Daily Activity  Outcome Measure  Difficulty turning over in  bed (including adjusting bedclothes, sheets and blankets)?: A Little Difficulty moving from lying on back to sitting on the side of the bed? : A Lot Difficulty sitting down on and standing up from a chair with arms (e.g., wheelchair, bedside commode, etc,.)?: Unable Help needed moving to and from a bed to chair (including a wheelchair)?: A Little Help needed walking in hospital room?: A Little Help needed climbing 3-5 steps with a railing? : A Lot 6 Click Score: 14    End of Session   Activity Tolerance: Patient limited by pain;Patient tolerated treatment well Patient left: in chair;with call bell/phone within reach;with chair alarm set Nurse Communication: Mobility status PT Visit Diagnosis: Other abnormalities of gait and mobility (R26.89);Pain;Muscle  weakness (generalized) (M62.81) Pain - Right/Left: Left Pain - part of body: Leg     Time: 4128-2081 PT Time Calculation (min) (ACUTE ONLY): 19 min  Charges:  $Gait Training: 8-22 mins                    G Codes:       2017-07-15  Donnella Sham, PT [phone]  (pager)   Tessie Fass [place] 07-15-17, 3:57 PM

## 2017-06-19 NOTE — Clinical Social Work Note (Signed)
Clinical Social Work Assessment  Patient Details  Name: Andrew [name] MRN: [id number] Date of Birth: 10-Apr-1934  Date of referral:  06/19/17               Reason for consult:  Facility Placement                Permission sought to share information with:  Facility Sport and exercise psychologist, Family Supports Permission granted to share information::  Yes, Verbal Permission Granted  Name::     [name]  Agency::  SNFs  Relationship::  Daughter  Contact Information:  [phone]  Housing/Transportation Living arrangements for the past 2 months:  Single Family Home Source of Information:  Patient, Adult Children, Spouse Patient Interpreter Needed:  None Criminal Activity/Legal Involvement Pertinent to Current Situation/Hospitalization:  No - Comment as needed Significant Relationships:  Adult Children, Spouse Lives with:  Spouse Do you feel safe going back to the place where you live?  No Need for family participation in patient care:  No (Coment)  Care giving concerns:  CSW received consult for possible SNF placement at time of discharge. CSW spoke with patient, spouse, and daughter regarding PT recommendation of SNF placement at time of discharge. Patient reported that patient's spouse is currently unable to care for patient at their home given patient's current physical needs and fall risk. Patient expressed understanding of PT recommendation and is agreeable to SNF placement at time of discharge. CSW to continue to follow and assist with discharge planning needs.   Social Worker assessment / plan:  CSW spoke with patient and family concerning possibility of rehab at [facility] before returning home.  Employment status:  Retired Nurse, adult, [place] PT Recommendations:  [place] / Referral to community resources:  [place]  Patient/Family's Response to care:  Patient and his family recognize need for rehab before  returning home and is agreeable to a SNF in [place]. Patient reported preference for [facility] since they are close to it and have friends that work there.   Patient/Family's Understanding of and Emotional Response to Diagnosis, Current Treatment, and Prognosis:  Patient/family is realistic regarding therapy needs and expressed being hopeful for SNF placement. Patient expressed understanding of CSW role and discharge process as well as his medical condition. No questions/concerns about plan or treatment.    Emotional Assessment Appearance:  Appears stated age Attitude/Demeanor/Rapport:  Other (Appropriate) Affect (typically observed):  Accepting, Appropriate, Pleasant Orientation:  Oriented to Self, Oriented to Situation, Oriented to Place, Oriented to  Time Alcohol / Substance use:  Not Applicable Psych involvement (Current and /or in the community):  No (Comment)  Discharge Needs  Concerns to be addressed:  Care Coordination Readmission within the last 30 days:  No Current discharge risk:  None Barriers to Discharge:  Continued Medical Work up   [employer], [place] 06/19/2017, 10:59 AM

## 2017-06-19 NOTE — Progress Notes (Signed)
AM vital signs taken by this RN. Wrong user entered in epic vital sign flowsheet by accident.

## 2017-06-19 NOTE — Progress Notes (Signed)
Pt arrived to [facility] from the ED at 2351 via stretcher. Pt was slid over from the stretcher to the bed d/t pain/weakness in left leg/calf. Pt A&Ox3, VSS upon arrival to unit. Pt oriented to unit/rules/room/call light, call light within reach. White bracelet/falls bracelet is present with correct patient identifiers. Skin is intact upon admission with no open areas, however dry/flaky especially lower legs/feet. Left lower extremity yellow in color with some redness behind the left knee. Sacrum pink but blanchable, no open areas noted. Pt ordered for low bed and continuous O2. Pt resting in bed comfortably. Will ctm.

## 2017-06-19 NOTE — NC FL2 (Signed)
[place] LEVEL OF CARE SCREENING TOOL     IDENTIFICATION  Patient Name: Andrew [name] Birthdate: 21-Mar-1934 Sex: male Admission Date (Current Location): 06/18/2017  [facility] and [place] Number:  Herbalist and Address:  [facility] [place] [place] [address] [place], [place] [address]      Provider Number: [id number]  Attending Physician Name and Address:  Bartholomew Crews, MD  Relative Name and Phone Number:  Inez Catalina, spouse, [phone]    Current Level of Care: Hospital Recommended Level of Care: [place] Prior Approval Number:    Date Approved/Denied:   PASRR Number: [id number]  Discharge Plan: SNF    Current Diagnoses: Patient Active Problem List   Diagnosis Date Noted  . Hematoma 06/18/2017  . Diverticulosis of colon with hemorrhage 04/05/2014  . Benign neoplasm of colon 04/05/2014  . Internal hemorrhoids without mention of complication 84/69/6295  . Chronic kidney disease, stage 3, mod decreased GFR 04/03/2014  . Thrombocytopenia, unspecified ([place]) 04/03/2014  . Acute blood loss anemia 04/03/2014  . GI bleed 04/02/2014  . Atrial fibrillation ([place]) 04/02/2014  . History of colon cancer 04/02/2014  . Seizure ([place]) 04/02/2014    Orientation RESPIRATION BLADDER Height & Weight     Self, Time, Situation, Place  Normal Continent Weight: 88.9 kg (195 lb 15.8 oz) Height:  5\' 10"  (177.8 cm)  BEHAVIORAL SYMPTOMS/MOOD NEUROLOGICAL BOWEL NUTRITION STATUS      Continent Diet (Please see DC Summary)  AMBULATORY STATUS COMMUNICATION OF NEEDS Skin   Extensive Assist Verbally Normal                       Personal Care Assistance Level of Assistance  Bathing, Feeding, Dressing Bathing Assistance: Limited assistance Feeding assistance: Independent Dressing Assistance: Limited assistance     Functional Limitations Info             SPECIAL CARE FACTORS FREQUENCY  PT (By licensed PT)     PT  Frequency: 5x/week              Contractures      Additional Factors Info  Code Status, Allergies, Insulin Sliding Scale Code Status Info: Full Allergies Info: Tegretol Carbamazepine, Depakote Divalproex Sodium, Dilantin Phenytoin Sodium Extended, Phenobarbital   Insulin Sliding Scale Info: 3x daily with meals       Current Medications (06/19/2017):  This is the current hospital active medication list Current Facility-Administered Medications  Medication Dose Route Frequency Provider Last Rate Last Dose  . 0.9 %  sodium chloride infusion   Intravenous Continuous Burgess Estelle, MD 75 mL/hr at 06/19/17 0128    . acetaminophen (TYLENOL) tablet 650 mg  650 mg Oral Q6H PRN Burgess Estelle, MD   650 mg at 06/19/17 0439   Or  . acetaminophen (TYLENOL) suppository 650 mg  650 mg Rectal Q6H PRN Burgess Estelle, MD      . Derrill Memo ON 06/20/2017] Influenza vac split quadrivalent PF (FLUZONE HIGH-DOSE) injection 0.5 mL  0.5 mL Intramuscular Tomorrow-1000 Bartholomew Crews, MD      . insulin aspart (novoLOG) injection 0-9 Units  0-9 Units Subcutaneous TID WC Burgess Estelle, MD      . metoprolol tartrate (LOPRESSOR) tablet 12.5 mg  12.5 mg Oral QHS Burgess Estelle, MD   12.5 mg at 06/19/17 0128  . [START ON 06/20/2017] pneumococcal 23 valent vaccine (PNU-IMMUNE) injection 0.5 mL  0.5 mL Intramuscular Tomorrow-1000 Bartholomew Crews, MD      .  promethazine (PHENERGAN) tablet 12.5 mg  12.5 mg Oral Q6H PRN Burgess Estelle, MD      . senna-docusate (Senokot-S) tablet 1 tablet  1 tablet Oral QHS PRN Burgess Estelle, MD      . sodium chloride flush (NS) 0.9 % injection 3 mL  3 mL Intravenous Q12H Burgess Estelle, MD   3 mL at 06/19/17 0129  . zonisamide (ZONEGRAN) capsule 300 mg  300 mg Oral BID Burgess Estelle, MD   300 mg at 06/19/17 0130     Discharge Medications: Please see discharge summary for a list of discharge medications.  Relevant Imaging Results:  Relevant Lab Results:   Additional  Information SSN: [place], [place]

## 2017-06-19 NOTE — Discharge Summary (Signed)
Name: Andrew [name] MRN: [id number] DOB: 1934/02/13 83 y.o. PCP: System, Pcp Not In  Date of Admission: 06/18/2017  9:22 AM Date of Discharge: 06/20/2017 Attending Physician: Bartholomew Crews, MD  Discharge Diagnosis: 1. Intramuscular hematoma   Active Problems:   Hematoma   Discharge Medications: Allergies as of 06/20/2017      Reactions   Tegretol [carbamazepine] Other (See Comments)   Made patient's feet swell   Depakote [divalproex Sodium] Rash   Dilantin [phenytoin Sodium Extended] Rash   Phenobarbital Rash      Medication List    TAKE these medications   acetaminophen 325 MG tablet Commonly known as:  TYLENOL Take 2 tablets (650 mg total) by mouth every 6 (six) hours as needed for mild pain (or Fever >/= 101).   hydrocortisone cream 1 % Apply 1 application topically every 4 (four) hours.   metFORMIN 500 MG tablet Commonly known as:  GLUCOPHAGE Take 1 tablet (500 mg total) by mouth as directed. What changed:  when to take this   metoprolol tartrate 25 MG tablet Commonly known as:  LOPRESSOR Take 12.5 mg by mouth at bedtime.   ONE-A-DAY MENS 50+ ADVANTAGE Tabs Take 1 tablet by mouth at bedtime.   vitamin B-12 1000 MCG tablet Commonly known as:  CYANOCOBALAMIN Take 1,000 mcg by mouth daily.   Vitamin D-3 1000 units Caps Take 1,000 Units by mouth daily.   warfarin 5 MG tablet Commonly known as:  COUMADIN Take 7.5-10 mg by mouth See admin instructions. 7.5 mg at bedtime on Sun/Tues/Wed/Fri and 10 mg on Mon/Thurs/Sat   warfarin 2.5 MG tablet Commonly known as:  COUMADIN Take 1 tablet (2.5 mg total) by mouth daily at 6 PM.   zonisamide 100 MG capsule Commonly known as:  ZONEGRAN Take 300 mg by mouth 2 (two) times daily. MORNING and EVENING            Discharge Care Instructions        Start     Ordered   06/20/17 0000  acetaminophen (TYLENOL) 325 MG tablet  Every 6 hours PRN     06/20/17 1123   06/20/17 0000  Increase activity slowly       06/20/17 1123   06/20/17 0000  Diet general     06/20/17 1123   06/20/17 0000  Call MD for:  severe uncontrolled pain     06/20/17 1123   06/20/17 0000  Call MD for:  redness, tenderness, or signs of infection (pain, swelling, redness, odor or green/yellow discharge around incision site)     06/20/17 1123      Disposition and follow-up:   [name] was discharged from [name] in Stable condition.  At the hospital follow up visit please address:  1.  Please assess improvement in ambulation and pain. Please assess if left lower extremity remains neurovascularly intact.   2.  Labs / imaging needed at time of follow-up: INR  3.  Pending labs/ test needing follow-up: None  Follow-up Appointments: [place] Follow up.   Specialty:  General Practice Contact information: [place] [address] [place] Hospital Course by problem list:   Andrew [name]. Andrew [name] 83 year old male with history of Afib on Coumadin, epilepsy, CKD stage III, a history of colorectal cancer 20 years ago presents to the ED with left leg pain and found to have an intramuscular  hematoma of gastrocnemius muscle on CT.  1. L gastrocnemius hematoma: Patient presented with a 1-week history of progressive left lower extremity pain, weakness, edema, and yellow discoloration. He also reported inability to stand up, bear weight on LLE or ambulate. L lower extremity CT showed gastrocnemius muscle hematoma, which was believed to be spontaneous in the setting of coumadin therapy as patient denied any recent trauma. Home coumadin was held due to concern for active bleeding. INR 2.03 on admission. However, patient remained hemodynamically stable and Hgb remained at 9.4. Therefore, home coumadin was restarted the next day. Ortho was consulted and recommended rest, ice, compression, elevation and weight bearing as tolerated. He was also evaluated  by PT who recommended SNF for rehabilitation. On day of discharge, Hgb remained stable at 9.4 and INR 2.46.   2. Atrial fibrillation: Patient is rate controlled with metoprolol and is on Coumadin for anticoagulation.  Coumadin held for 1 day as described above and resumed on day 2 of hospitalization.   Patient remained on all his home medications for his chronic medical problems, except as detailed above.     Discharge Vitals:   BP (!) 109/59 (BP Location: Right Arm)   Pulse 76   Temp 97.7 F (36.5 C) (Oral)   Resp 18   Ht 5\' 10"  (1.778 m)   Wt 196 lb 3.4 oz (89 kg)   SpO2 100%   BMI 28.15 kg/m   Pertinent Labs, Studies, and Procedures:   CT LLE: IMPRESSION: 1. Intramuscular hematoma in the left gastrocnemius muscle.    Discharge Instructions: Discharge Instructions    Call MD for:  redness, tenderness, or signs of infection (pain, swelling, redness, odor or green/yellow discharge around incision site)    Complete by:  As directed    Call MD for:  severe uncontrolled pain    Complete by:  As directed    Diet general    Complete by:  As directed    Increase activity slowly    Complete by:  As directed       Signed: Welford Roche, MD  Internal Medicine PGY-1  P [phone]

## 2017-06-20 DIAGNOSIS — R2689 Other abnormalities of gait and mobility: Secondary | ICD-10-CM | POA: Diagnosis not present

## 2017-06-20 DIAGNOSIS — R278 Other lack of coordination: Secondary | ICD-10-CM | POA: Diagnosis not present

## 2017-06-20 DIAGNOSIS — M79662 Pain in left lower leg: Secondary | ICD-10-CM | POA: Diagnosis not present

## 2017-06-20 DIAGNOSIS — Z888 Allergy status to other drugs, medicaments and biological substances status: Secondary | ICD-10-CM | POA: Diagnosis not present

## 2017-06-20 DIAGNOSIS — G40909 Epilepsy, unspecified, not intractable, without status epilepticus: Secondary | ICD-10-CM | POA: Diagnosis not present

## 2017-06-20 DIAGNOSIS — E119 Type 2 diabetes mellitus without complications: Secondary | ICD-10-CM | POA: Diagnosis not present

## 2017-06-20 DIAGNOSIS — X58XXXA Exposure to other specified factors, initial encounter: Secondary | ICD-10-CM | POA: Diagnosis not present

## 2017-06-20 DIAGNOSIS — I621 Nontraumatic extradural hemorrhage: Secondary | ICD-10-CM | POA: Diagnosis not present

## 2017-06-20 DIAGNOSIS — I10 Essential (primary) hypertension: Secondary | ICD-10-CM | POA: Diagnosis not present

## 2017-06-20 DIAGNOSIS — N183 Chronic kidney disease, stage 3 (moderate): Secondary | ICD-10-CM | POA: Diagnosis not present

## 2017-06-20 DIAGNOSIS — Z79899 Other long term (current) drug therapy: Secondary | ICD-10-CM | POA: Diagnosis not present

## 2017-06-20 DIAGNOSIS — M6281 Muscle weakness (generalized): Secondary | ICD-10-CM | POA: Diagnosis not present

## 2017-06-20 DIAGNOSIS — E1122 Type 2 diabetes mellitus with diabetic chronic kidney disease: Secondary | ICD-10-CM | POA: Diagnosis not present

## 2017-06-20 DIAGNOSIS — T148XXD Other injury of unspecified body region, subsequent encounter: Secondary | ICD-10-CM | POA: Diagnosis not present

## 2017-06-20 DIAGNOSIS — Z7901 Long term (current) use of anticoagulants: Secondary | ICD-10-CM | POA: Diagnosis not present

## 2017-06-20 DIAGNOSIS — Z7984 Long term (current) use of oral hypoglycemic drugs: Secondary | ICD-10-CM | POA: Diagnosis not present

## 2017-06-20 DIAGNOSIS — Z85038 Personal history of other malignant neoplasm of large intestine: Secondary | ICD-10-CM | POA: Diagnosis not present

## 2017-06-20 DIAGNOSIS — R2681 Unsteadiness on feet: Secondary | ICD-10-CM | POA: Diagnosis not present

## 2017-06-20 DIAGNOSIS — Z23 Encounter for immunization: Secondary | ICD-10-CM | POA: Diagnosis not present

## 2017-06-20 DIAGNOSIS — S8012XA Contusion of left lower leg, initial encounter: Secondary | ICD-10-CM | POA: Diagnosis not present

## 2017-06-20 DIAGNOSIS — S8990XA Unspecified injury of unspecified lower leg, initial encounter: Secondary | ICD-10-CM | POA: Diagnosis not present

## 2017-06-20 DIAGNOSIS — I481 Persistent atrial fibrillation: Secondary | ICD-10-CM | POA: Diagnosis not present

## 2017-06-20 DIAGNOSIS — S8012XD Contusion of left lower leg, subsequent encounter: Secondary | ICD-10-CM | POA: Diagnosis not present

## 2017-06-20 DIAGNOSIS — Z87891 Personal history of nicotine dependence: Secondary | ICD-10-CM | POA: Diagnosis not present

## 2017-06-20 DIAGNOSIS — I129 Hypertensive chronic kidney disease with stage 1 through stage 4 chronic kidney disease, or unspecified chronic kidney disease: Secondary | ICD-10-CM | POA: Diagnosis not present

## 2017-06-20 DIAGNOSIS — D649 Anemia, unspecified: Secondary | ICD-10-CM | POA: Diagnosis not present

## 2017-06-20 LAB — BASIC METABOLIC PANEL
ANION GAP: 7 (ref 5–15)
BUN: 20 mg/dL (ref 6–20)
CALCIUM: 8.8 mg/dL — AB (ref 8.9–10.3)
CO2: 23 mmol/L (ref 22–32)
CREATININE: 1.4 mg/dL — AB (ref 0.61–1.24)
Chloride: 106 mmol/L (ref 101–111)
GFR calc non Af Amer: 46 mL/min — ABNORMAL LOW (ref >60)
GFR, EST AFRICAN AMERICAN: 53 mL/min — AB (ref >60)
Glucose, Bld: 150 mg/dL — ABNORMAL HIGH (ref 65–99)
Potassium: 4.3 mmol/L (ref 3.5–5.1)
Sodium: 136 mmol/L (ref 135–145)

## 2017-06-20 LAB — PROTIME-INR
INR: 2.46
PROTHROMBIN TIME: 26.5 s — AB (ref 11.4–15.2)

## 2017-06-20 LAB — GLUCOSE, CAPILLARY
GLUCOSE-CAPILLARY: 142 mg/dL — AB (ref 65–99)
GLUCOSE-CAPILLARY: 156 mg/dL — AB (ref 65–99)

## 2017-06-20 MED ORDER — ACETAMINOPHEN 325 MG PO TABS: 650.0000 mg | ORAL_TABLET | Freq: Four times a day (QID) | ORAL | 0 refills | Status: AC | PRN

## 2017-06-20 NOTE — Discharge Instructions (Addendum)
You were admitted to [facility] is a bruise in the muscle of the left leg.   We checked your blood count levels we did not see any signs of active bleeding while you were here. Therefore, it is safe to continue to taking you home warfarin. You will take 7.5g mg on Sunday/Tuesday/Wednesday/Friday and 10 mg Monday/Thursday/Saturday.   The orthopedic surgeon saw you while you were in the hospital and recommended that you continue to rest, use ice and compression, and elevation of your leg. The swelling and bruising in your leg will take some time to go away.   Please continue to work with physical therapy at the nursing facility. This will help you gain your strength back and return home safely.   Please make a hospital follow-up appointment with your primary care doctor within 7 days.

## 2017-06-20 NOTE — Progress Notes (Signed)
Patient will DC to: [facility] Anticipated DC date: 06/20/17 Family notified: Spouse, daughter Transport by: PTAR 2:30pm   Per MD patient ready for DC to [facility]. RN, patient, patient's family, and facility notified of DC. Discharge Summary sent to facility. RN given number for report [phone] Room 105). DC packet on chart. Ambulance transport requested for patient.   CSW signing off.  Cedric Fishman, [place] Social Worker [phone]

## 2017-06-20 NOTE — Clinical Social Work Placement (Signed)
   CLINICAL SOCIAL WORK PLACEMENT  NOTE  Date:  06/20/2017  Patient Details  Name: Andrew [name] MRN: [id number] Date of Birth: Sep 06, 1934  Clinical Social Work is seeking post-discharge placement for this patient at the [place] level of care (*CSW will initial, date and re-position this form in  chart as items are completed):  Yes   Patient/family provided with [place] Work Department's list of facilities offering this level of care within the geographic area requested by the patient (or if unable, by the patient's family).  Yes   Patient/family informed of their freedom to choose among providers that offer the needed level of care, that participate in Medicare, Medicaid or managed care program needed by the patient, have an available bed and are willing to accept the patient.  Yes   Patient/family informed of [facility]'s ownership interest in [facility] and [facility], as well as of the fact that they are under no obligation to receive care at these facilities.  PASRR submitted to EDS on 06/19/17     PASRR number received on 06/19/17     Existing PASRR number confirmed on       FL2 transmitted to all facilities in geographic area requested by pt/family on 06/19/17     FL2 transmitted to all facilities within larger geographic area on       Patient informed that his/her managed care company has contracts with or will negotiate with certain facilities, including the following:        Yes   Patient/family informed of bed offers received.  Patient chooses bed at [facility] [place]     Physician recommends and patient chooses bed at      Patient to be transferred to [facility] on 06/20/17.  Patient to be transferred to facility by PTAR     Patient family notified on 06/20/17 of transfer.  Name of family member notified:  Spouse, daughter     PHYSICIAN       Additional Comment:     _______________________________________________ Benard Halsted, [place] 06/20/2017, 11:45 AM

## 2017-06-20 NOTE — Progress Notes (Signed)
PIV removed and dressing placed. AVS printed and given PTAR.  Report called Wilburn Cornelia at [facility]. Pt left via stretcher w PTAR

## 2017-06-20 NOTE — Care Management CC44 (Signed)
Condition Code 44 Documentation Completed  Patient Details  Name: CHRSTOPHER MALENFANT MRN: [id number] Date of Birth: 1934-04-17   Condition Code 44 given:  Yes Patient signature on Condition Code 44 notice:  Yes Documentation of 2 MD's agreement:  Yes Code 44 added to claim:  Yes    Sharin Mons, RN 06/20/2017, 12:26 PM

## 2017-06-20 NOTE — Progress Notes (Signed)
  Date: 06/20/2017  Patient name: Andrew [name]  Medical record number: [id number]  Date of birth: 12-25-1933   I have seen and evaluated this patient and I have discussed the plan of care with the house staff. Please see their note for complete details. I concur with their findings with the following additions/corrections: Mr Scullion was able to walk with PT yesterday. Stable for SNF today.  Bartholomew Crews, MD 06/20/2017, 1:53 PM

## 2017-06-20 NOTE — Care Management Obs Status (Signed)
MEDICARE OBSERVATION STATUS NOTIFICATION   Patient Details  Name: Andrew [name] MRN: [id number] Date of Birth: Oct 22, 1933   Medicare Observation Status Notification Given:  Yes    Sharin Mons, RN 06/20/2017, 12:26 PM

## 2017-06-20 NOTE — Progress Notes (Signed)
   Subjective:  No acute events overnight. Patient able to bear weight on LLE and ambulate with PT yesterday. Continues to state that he is feeling terrible due to pain. Informed patient he has Norco available for pain, but he would like to continue taking Tylenol. No other complaints this morning.   Objective:  Vital signs in last 24 hours: Vitals:   06/19/17 0010 06/19/17 0432 06/19/17 1417 06/19/17 2146  BP: (!) 143/95 131/68 (!) 122/54 (!) 118/51  Pulse: 91 85 76 95  Resp: 18  (!) 21 16  Temp: 98.6 F (37 C) 97.9 F (36.6 C) 97.8 F (36.6 C) 98.9 F (37.2 C)  TempSrc: Oral Oral Oral Oral  SpO2: 97% 99% 95% 100%  Weight: 195 lb 15.8 oz (88.9 kg)     Height: 5\' 10"  (1.778 m)       General: pleasant male, well-developed, lying bed in no acute distress Cardiac: regular rate and rhythm, nl S1/S2, no murmurs, rubs or gallops  Pulm: CTAB, no wheezes or crackles, no increased work of breathing  Abd: soft, NTND, bowel sounds present  Neuro: A&Ox3, able to move all 4 extremities LLE: extremity continues to be swollen but improved from yesterday. Has yellow discoloration to it. Patient continues to experience pain on left calf. Warm and well-perfused.    Assessment/Plan:  Nelda Severe. [place] 83 year old male with history of Afib on Coumadin, epilepsy, CKD stage III, a history of colorectal cancer 20 years ago presents to the ED with left leg pain and found to have an intramuscular hematoma of gastrocnemius muscle on CT.  # L gastrocnemius hematoma: No recent trauma. ?Secondary to warfarin. Left lower extremity continues to be neurovascularly intact. Patient able to bear weight some weight and ambulate with walker yesterday during PT session. INR therapeutic at 2.46 - Continue RICE therapy  - Continue home warfarin  - Neurovascular checks  - Pain control: Norco 5 mg q8h PRN + Tylenol   # Atrial fibrillation: rate controlled on metoprolol  - Continue home metoprolol 12.5 mg QD  -  Continue warfarin as above    # Epilepsy: last seizure 6 months ago  - Continue home Zonegran 300 mg BID   # CKD Stage III: unknown baseline. Cr remains at 1.4 today. Will continue to monitor Cr as patient received IV contrast for MRI. Will hydrate with IVF if worsening renal function.  - Avoid nephrotoxic medications   # T2DM/?Insulin resistance: Patient on metformin 500 mg QD at home. No A1c in results review or care everywhere. Unclear when patient was started on metformin.  - Hold home metformin, will resume on discharge  - SSI-S   F: None  E: will continue to monitor  N: Regular diet   VTE ppx: home warfarin   Code status: Full code, confirmed on admission   Dispo: Anticipated discharge in approximately today pending SNF placement.   Welford Roche, MD  Internal Medicine PGY-1  P [phone]

## 2017-06-23 DIAGNOSIS — Z7901 Long term (current) use of anticoagulants: Secondary | ICD-10-CM | POA: Diagnosis not present

## 2017-06-23 DIAGNOSIS — R2689 Other abnormalities of gait and mobility: Secondary | ICD-10-CM | POA: Diagnosis not present

## 2017-06-23 DIAGNOSIS — E119 Type 2 diabetes mellitus without complications: Secondary | ICD-10-CM | POA: Diagnosis not present

## 2017-06-23 DIAGNOSIS — S8012XD Contusion of left lower leg, subsequent encounter: Secondary | ICD-10-CM | POA: Diagnosis not present

## 2017-06-23 DIAGNOSIS — I10 Essential (primary) hypertension: Secondary | ICD-10-CM | POA: Diagnosis not present

## 2017-07-02 ENCOUNTER — Other Ambulatory Visit: Payer: Self-pay | Admitting: *Deleted

## 2017-07-02 NOTE — Patient Outreach (Signed)
[facility]) Care Management  07/02/2017  Andrew [name] 1933/12/02 [id number]   Met with [name], SW at facility Patient to discharge on 10/10.  She is setting up [facility] for home health and PT/INR No DME needs.  Therapy making home visit with patient today. SW states no outstanding discharge planning needs assessed.   Attempted to meet with patient to give [facility] information but he was out of facility for home visit Plan to sign off [name]. [name], RN, BSN, [name] [phone]) Business Cell  [phone]) Toll Free Office

## 2017-07-05 ENCOUNTER — Other Ambulatory Visit: Payer: Self-pay | Admitting: *Deleted

## 2017-07-05 NOTE — Patient Outreach (Addendum)
  [facility]) Care Management  07/05/2017  Andrew [name] 05-03-1934 [id number]  Transition of Care Referral  Referral Date: 07/04/17 Referral Source: [facility] Referral Date of Discharge: 07/04/17 Facility: [facility] Discharge Diagnosis: Left Gastrocnemius Hematoma Insurance: [facility] Medicare  Outreach phone call made to patient. HIPAA verified with patient. Patient reported, his primary MD is within the [place] system. Patient stated, he was instructed by the [facility] doctor to continue to follow-up with Dr. Lynnae January for his leg complication. Patient stated after he completes his visits/therapy with Dr. Lynnae January, he will continue seeking treatment within the [facility] system. Patient is active with [facility] (PT). He ambulates with a rolling walker. His grandson transports patient to medical appointments. He has a scheduled hospital follow-up appointment with Dr. Lynnae January on 07/10/17.    Plan: RN CM will notify [facility] Case Management Assistant regarding case closure.     Lake Bells, RN, BSN, MHA/MSL, [place] Telephonic Care Manager Coordinator [facility] Direct Phone: [phone] Toll Free: [phone] Fax: [phone]

## 2017-07-05 NOTE — Patient Outreach (Signed)
[facility]) Care Management  07/05/2017  Andrew [name] Jan 11, 1934 [id number]   Transition of Care Referral  Referral Date: 07/04/17 Referral Source: [facility] Referral Date of Discharge: 07/04/17 Facility: [facility] Discharge Diagnosis: Left Gastrocnemius Hematoma Insurance: [facility] Medicare  Outreach attempt #1 to patient. No answer. RN CM left HIPAA compliant message along with contact info.    Plan: RN CM will contact patient within one week.    L[place] Bells, RN, BSN, MHA/MSL, [place] Telephonic Care Manager Coordinator [facility] Direct Phone: [phone] Toll Free: [phone] Fax: [phone]

## 2017-07-08 ENCOUNTER — Telehealth: Payer: Self-pay | Admitting: *Deleted

## 2017-07-08 ENCOUNTER — Telehealth (INDEPENDENT_AMBULATORY_CARE_PROVIDER_SITE_OTHER): Payer: Medicare Other | Admitting: *Deleted

## 2017-07-08 ENCOUNTER — Ambulatory Visit: Payer: Medicare Other | Admitting: *Deleted

## 2017-07-08 DIAGNOSIS — E1122 Type 2 diabetes mellitus with diabetic chronic kidney disease: Secondary | ICD-10-CM | POA: Diagnosis not present

## 2017-07-08 DIAGNOSIS — Z7901 Long term (current) use of anticoagulants: Secondary | ICD-10-CM

## 2017-07-08 DIAGNOSIS — N183 Chronic kidney disease, stage 3 (moderate): Secondary | ICD-10-CM | POA: Diagnosis not present

## 2017-07-08 DIAGNOSIS — I129 Hypertensive chronic kidney disease with stage 1 through stage 4 chronic kidney disease, or unspecified chronic kidney disease: Secondary | ICD-10-CM | POA: Diagnosis not present

## 2017-07-08 DIAGNOSIS — I4891 Unspecified atrial fibrillation: Secondary | ICD-10-CM

## 2017-07-08 DIAGNOSIS — I482 Chronic atrial fibrillation: Secondary | ICD-10-CM | POA: Diagnosis not present

## 2017-07-08 LAB — POCT INR: INR: 1.6

## 2017-07-08 NOTE — Progress Notes (Addendum)
Anticoagulation Management Andrew [name] is a 83 y.o. male  who is being monitored for warfarin treatment.  Patient assessment completed by [place], [place].  Indication: atrial fibrillation CHA2DS2 Vasc Score 2, HAS-BLED 2 Duration: indefinite Supervising physician: Larey Dresser  Anticoagulation Clinic Visit History: Patient does not report signs/symptoms of bleeding or thromboembolism  Anticoagulation Episode Summary    Current INR goal:   2.0-3.0  TTR:   -  Next INR check:   07/15/2017  INR from last check:   1.6! (07/08/2017)  Weekly max warfarin dose:     Target end date:     INR check location:     Preferred lab:     Send INR reminders to:      Indications   Atrial fibrillation ([place]) [I48.91]       Comments:          Allergies  Allergen Reactions  . Tegretol [Carbamazepine] Other (See Comments)    Made patient's feet swell  . Depakote [Divalproex Sodium] Rash  . Dilantin [Phenytoin Sodium Extended] Rash  . Phenobarbital Rash   Medication Sig  acetaminophen (TYLENOL) 325 MG tablet Take 2 tablets (650 mg total) by mouth every 6 (six) hours as needed for mild pain (or Fever >/= 101).  Cholecalciferol (VITAMIN D-3) 1000 units CAPS Take 1,000 Units by mouth daily.  hydrocortisone cream 1 % Apply 1 application topically every 4 (four) hours.  metFORMIN (GLUCOPHAGE) 500 MG tablet Take 1 tablet (500 mg total) by mouth as directed. Patient taking differently: Take 500 mg by mouth at bedtime.   metoprolol tartrate (LOPRESSOR) 25 MG tablet Take 12.5 mg by mouth at bedtime.   Multiple Vitamins-Minerals (ONE-A-DAY MENS 50+ ADVANTAGE) TABS Take 1 tablet by mouth at bedtime.  vitamin B-12 (CYANOCOBALAMIN) 1000 MCG tablet Take 1,000 mcg by mouth daily.  warfarin (COUMADIN) 2.5 MG tablet Take 1 tablet (2.5 mg total) by mouth daily at 6 PM. Patient not taking: Reported on 06/18/2017  warfarin (COUMADIN) 5 MG tablet Take 7.5-10 mg by mouth See admin instructions. 7.5 mg  at bedtime on Sun/Tues/Wed/Fri and 10 mg on Mon/Thurs/Sat  zonisamide (ZONEGRAN) 100 MG capsule Take 300 mg by mouth 2 (two) times daily. MORNING and EVENING   Past Medical History:  Diagnosis Date  . A-fib ([place])   . Cancer ([place])   . Diabetes mellitus without complication ([place])   . Dysrhythmia   . GI bleed 04/02/2014  . Hypertension   . Seizures [facility])    Social History   Social History  . Marital status: Married    Spouse name: N/A  . Number of children: N/A  . Years of education: N/A   Social History Main Topics  . Smoking status: Former Smoker    Quit date: 09/24/1969  . Smokeless tobacco: Never Used  . Alcohol use No  . Drug use: No  . Sexual activity: Not on file   Other Topics Concern  . Not on file   Social History Narrative  . No narrative on file   Family History  Problem Relation Age of Onset  . Diabetes Mellitus II Mother   . Prostate cancer Father   . Diabetes Mellitus II Brother    ASSESSMENT Lab Results  Component Value Date   INR 1.6 07/08/2017   INR 2.46 06/20/2017   INR 2.03 06/18/2017   Anticoagulation Dosing: INR as of 07/08/2017 and Previous Warfarin Dosing Information    INR Dt INR Goal [employer] Sun Mon Tue Wed Thu Fri  Sat   07/08/2017 1.6 - 60 mg 7.5 mg 10 mg 7.5 mg 7.5 mg 10 mg 7.5 mg 10 mg   Patient deviated from recommended dosing.       Anticoagulation Warfarin Dose Instructions as of 07/08/2017      Total Sun Mon Tue Wed Thu Fri Sat   New Dose 62.5 mg 7.5 mg 10 mg 7.5 mg 10 mg 7.5 mg 10 mg 10 mg     (5 mg x 1.5)  (5 mg x 2)  (5 mg x 1.5)  (5 mg x 2)  (5 mg x 1.5)  (5 mg x 2)  (5 mg x 2)                           INR today: Subtherapeutic  PLAN Weekly dose was increased by 5% to 62.5 mg per week since patient was therapeutic a few weeks ago, will only increase slightly today and re-check 1 week  Patient Instructions  Patient educated about medication as defined in this encounter and verbalized understanding by repeating  back instructions provided.   Patient advised to contact clinic or seek medical attention if signs/symptoms of bleeding or thromboembolism occur.  Patient verbalized understanding by repeating back information and was advised to contact me if further medication-related questions arise. Patient was also provided an information handout.  Follow-up Return in about 1 week (around 07/15/2017).  [name]

## 2017-07-08 NOTE — Patient Instructions (Signed)
Patient educated about medication as defined in this encounter and verbalized understanding by repeating back instructions provided.   

## 2017-07-08 NOTE — Telephone Encounter (Signed)
VO OK until Home health signs off in 3 weeks.

## 2017-07-08 NOTE — Telephone Encounter (Signed)
[facility] calls and states that today: PT   18.9  INR   1.6   Therapy at present is 7.5mg  tues, wed, fri, sun                                     10.mg mon, thurs, sat He takes his coumadin at [facility]  Please advise, sending to dr Software engineer, dr groce and dr Maudie Mercury

## 2017-07-08 NOTE — Telephone Encounter (Signed)
VO for assessment and INR today and future visits for 3 weeks. Pt has been seen at [facility] and [facility], cannot tell if he will be coming here for primary care, doriss. Not available at this time. [phone] kecia call Tuesday after visit to confirm Do you agree?

## 2017-07-09 ENCOUNTER — Ambulatory Visit: Payer: Medicare Other

## 2017-07-09 DIAGNOSIS — I482 Chronic atrial fibrillation: Secondary | ICD-10-CM | POA: Diagnosis not present

## 2017-07-09 DIAGNOSIS — I129 Hypertensive chronic kidney disease with stage 1 through stage 4 chronic kidney disease, or unspecified chronic kidney disease: Secondary | ICD-10-CM | POA: Diagnosis not present

## 2017-07-09 DIAGNOSIS — E1122 Type 2 diabetes mellitus with diabetic chronic kidney disease: Secondary | ICD-10-CM | POA: Diagnosis not present

## 2017-07-09 DIAGNOSIS — N183 Chronic kidney disease, stage 3 (moderate): Secondary | ICD-10-CM | POA: Diagnosis not present

## 2017-07-10 ENCOUNTER — Ambulatory Visit (INDEPENDENT_AMBULATORY_CARE_PROVIDER_SITE_OTHER): Payer: Medicare Other | Admitting: Internal Medicine

## 2017-07-10 VITALS — BP 134/60 | HR 77 | Temp 97.6°F | Wt 197.0 lb

## 2017-07-10 DIAGNOSIS — I159 Secondary hypertension, unspecified: Secondary | ICD-10-CM

## 2017-07-10 DIAGNOSIS — Z7901 Long term (current) use of anticoagulants: Secondary | ICD-10-CM

## 2017-07-10 DIAGNOSIS — S8012XD Contusion of left lower leg, subsequent encounter: Secondary | ICD-10-CM | POA: Diagnosis not present

## 2017-07-10 DIAGNOSIS — I4891 Unspecified atrial fibrillation: Secondary | ICD-10-CM | POA: Diagnosis not present

## 2017-07-10 DIAGNOSIS — I10 Essential (primary) hypertension: Secondary | ICD-10-CM | POA: Insufficient documentation

## 2017-07-10 DIAGNOSIS — Z8673 Personal history of transient ischemic attack (TIA), and cerebral infarction without residual deficits: Secondary | ICD-10-CM | POA: Diagnosis not present

## 2017-07-10 DIAGNOSIS — X58XXXD Exposure to other specified factors, subsequent encounter: Secondary | ICD-10-CM

## 2017-07-10 DIAGNOSIS — T148XXA Other injury of unspecified body region, initial encounter: Secondary | ICD-10-CM

## 2017-07-10 LAB — POCT INR: INR: 1.9

## 2017-07-10 NOTE — Progress Notes (Signed)
   CC: Left leg hematoma f/u  HPI:  [name] is a 83 y.o. male with past medical history outlined below here for hospital follow up for his left calf hematoma. For the details of today's visit, please refer to the assessment and plan.  Past Medical History:  Diagnosis Date  . A-fib ([place])   . Cancer ([place])   . Diabetes mellitus without complication ([place])   . Dysrhythmia   . GI bleed 04/02/2014  . Hypertension   . Seizures ([place])     Review of Systems  Cardiovascular: Negative for chest pain.  Neurological: Negative for dizziness and loss of consciousness.    Physical Exam:  Vitals:   07/10/17 0926  BP: 134/60  Pulse: 77  Temp: 97.6 F (36.4 C)  TempSrc: Oral  SpO2: 100%  Weight: 197 lb (89.4 kg)    Constitutional: NAD, appears comfortable Cardiovascular: RRR, no murmurs, rubs, or gallops.  Pulmonary/Chest: CTAB, no wheezes, rales, or rhonchi.  Extremities: Warm and well perfused. Left lower extremity with swelling and ecchymosis above the ankle. Pulses intact.  Psychiatric: Normal mood and affect  Assessment & Plan:   See Encounters Tab for problem based charting.  Patient discussed with Dr. Lynnae January

## 2017-07-10 NOTE — Patient Instructions (Addendum)
Mr. Kamel,  It was a pleasure to see you today. Please continue to take your warfarin daily as instructed below:   Anticoagulation Warfarin Dose Instructions as of 07/10/2017      Total Sun Mon Tue Wed Thu Fri Sat   New Dose 62.5 mg 7.5 mg 10 mg 7.5 mg 10 mg 7.5 mg 10 mg 10 mg     (5 mg x 1.5)  (5 mg x 2)  (5 mg x 1.5)  (5 mg x 2)  (5 mg x 1.5)  (5 mg x 2)  (5 mg x 2)   Please follow up with our clinic or with your [facility] to have your INR rechecked in 2-4 weeks. If you have any questions or concerns, call our clinic at [phone] or after hours call [phone] and ask for the internal medicine resident on call. Thank you!   - Dr. Philipp Ovens

## 2017-07-10 NOTE — Assessment & Plan Note (Addendum)
Patient is anticoagulated in the setting of atrial fibrillation with multiple prior CVAs. He has now experienced 2 adverse bleeding events while anticoagulated. He reports being hospitalized 2 years ago for severe GI bleeding requiring transfusion. Most recently hospitalized last month for left lower extremity hematoma. Hemoglobin remained stable and he was discharged after a two day observation period. Despite these adverse events, he remains high risk for recurrent CVA and the benefit of continued anticoagulation likely outweighs any risk. Discussed risks and benefits with patient today, he is agreeable to continue with warfarin therapy. I agree with this decision. INR today is 1.9. -- Continue daily warfarin -- Follow up 2-4 weeks for recheck   Anticoagulation Warfarin Dose Instructions as of 07/10/2017      Total Sun Mon Tue Wed Thu Fri Sat   Dose 62.5 mg 7.5 mg 10 mg 7.5 mg 10 mg 7.5 mg 10 mg 10 mg     (5 mg x 1.5)  (5 mg x 2)  (5 mg x 1.5)  (5 mg x 2)  (5 mg x 1.5)  (5 mg x 2)  (5 mg x 2)

## 2017-07-10 NOTE — Assessment & Plan Note (Addendum)
Patient is here for hospital follow up for left gastrocnemius hematoma in the setting of anticoagulation for atrial fibrillation. INR was 2.0 on admission and his warfarin was held. Hemoglobin remained stable at 9.4. His warfarin was resumed the next day. INR on discharge was 2.46. Today, his left lower extremity is swollen the patient reports improved from hospitalization. He endorses some mild pain with ambulation. Reassured patient that hematoma should continue to improve over the next few weeks. Instructed patient to return if swelling or bruising worsened. -- Follow up as needed

## 2017-07-11 DIAGNOSIS — E1122 Type 2 diabetes mellitus with diabetic chronic kidney disease: Secondary | ICD-10-CM | POA: Diagnosis not present

## 2017-07-11 DIAGNOSIS — I482 Chronic atrial fibrillation: Secondary | ICD-10-CM | POA: Diagnosis not present

## 2017-07-11 DIAGNOSIS — N183 Chronic kidney disease, stage 3 (moderate): Secondary | ICD-10-CM | POA: Diagnosis not present

## 2017-07-11 DIAGNOSIS — I129 Hypertensive chronic kidney disease with stage 1 through stage 4 chronic kidney disease, or unspecified chronic kidney disease: Secondary | ICD-10-CM | POA: Diagnosis not present

## 2017-07-11 NOTE — Telephone Encounter (Signed)
Tried to call kecia back to update on visit, no answer

## 2017-07-12 DIAGNOSIS — E1122 Type 2 diabetes mellitus with diabetic chronic kidney disease: Secondary | ICD-10-CM | POA: Diagnosis not present

## 2017-07-12 DIAGNOSIS — N183 Chronic kidney disease, stage 3 (moderate): Secondary | ICD-10-CM | POA: Diagnosis not present

## 2017-07-12 DIAGNOSIS — I482 Chronic atrial fibrillation: Secondary | ICD-10-CM | POA: Diagnosis not present

## 2017-07-12 DIAGNOSIS — I129 Hypertensive chronic kidney disease with stage 1 through stage 4 chronic kidney disease, or unspecified chronic kidney disease: Secondary | ICD-10-CM | POA: Diagnosis not present

## 2017-07-12 NOTE — Progress Notes (Signed)
Internal Medicine Clinic Attending  Case discussed with Dr. Guilloud at the time of the visit.  We reviewed the resident's history and exam and pertinent patient test results.  I agree with the assessment, diagnosis, and plan of care documented in the resident's note.  

## 2017-07-15 ENCOUNTER — Telehealth (INDEPENDENT_AMBULATORY_CARE_PROVIDER_SITE_OTHER): Payer: Medicare Other | Admitting: Pharmacist

## 2017-07-15 DIAGNOSIS — E1122 Type 2 diabetes mellitus with diabetic chronic kidney disease: Secondary | ICD-10-CM | POA: Diagnosis not present

## 2017-07-15 DIAGNOSIS — N183 Chronic kidney disease, stage 3 (moderate): Secondary | ICD-10-CM | POA: Diagnosis not present

## 2017-07-15 DIAGNOSIS — Z7901 Long term (current) use of anticoagulants: Secondary | ICD-10-CM

## 2017-07-15 DIAGNOSIS — I4891 Unspecified atrial fibrillation: Secondary | ICD-10-CM | POA: Diagnosis not present

## 2017-07-15 DIAGNOSIS — I482 Chronic atrial fibrillation: Secondary | ICD-10-CM | POA: Diagnosis not present

## 2017-07-15 DIAGNOSIS — I129 Hypertensive chronic kidney disease with stage 1 through stage 4 chronic kidney disease, or unspecified chronic kidney disease: Secondary | ICD-10-CM | POA: Diagnosis not present

## 2017-07-15 LAB — POCT INR: INR: 4.7

## 2017-07-15 NOTE — Patient Instructions (Signed)
Patient educated about medication as defined in this encounter and verbalized understanding by repeating back instructions provided.   

## 2017-07-15 NOTE — Progress Notes (Signed)
Anticoagulation Management Andrew [name] is a 83 y.o. male who is being monitored on warfarin therapy. KellieO'Brian, HHN with [facility] completed assessment and called in results today.  Indication: atrial fibrillation Duration: indefinite  Anticoagulation Clinic Visit History: Patient does not report signs/symptoms of bleeding or thromboembolism  Anticoagulation Episode Summary    Current INR goal:   2.0-3.0  TTR:   -  Next INR check:   07/17/2017  INR from last check:   4.7! (07/15/2017)  Weekly max warfarin dose:     Target end date:     INR check location:     Preferred lab:     Send INR reminders to:      Indications   Atrial fibrillation ([place]) [I48.91]       Comments:          Allergies  Allergen Reactions  . Tegretol [Carbamazepine] Other (See Comments)    Made patient's feet swell  . Depakote [Divalproex Sodium] Rash  . Dilantin [Phenytoin Sodium Extended] Rash  . Phenobarbital Rash   Medication Sig  acetaminophen (TYLENOL) 325 MG tablet Take 2 tablets (650 mg total) by mouth every 6 (six) hours as needed for mild pain (or Fever >/= 101).  Cholecalciferol (VITAMIN D-3) 1000 units CAPS Take 1,000 Units by mouth daily.  hydrocortisone cream 1 % Apply 1 application topically every 4 (four) hours.  metFORMIN (GLUCOPHAGE) 500 MG tablet Take 1 tablet (500 mg total) by mouth as directed. Patient taking differently: Take 500 mg by mouth at bedtime.   metoprolol tartrate (LOPRESSOR) 25 MG tablet Take 12.5 mg by mouth at bedtime.   Multiple Vitamins-Minerals (ONE-A-DAY MENS 50+ ADVANTAGE) TABS Take 1 tablet by mouth at bedtime.  vitamin B-12 (CYANOCOBALAMIN) 1000 MCG tablet Take 1,000 mcg by mouth daily.  warfarin (COUMADIN) 2.5 MG tablet Take 1 tablet (2.5 mg total) by mouth daily at 6 PM. Patient not taking: Reported on 06/18/2017  warfarin (COUMADIN) 5 MG tablet Take 7.5-10 mg by mouth See admin instructions. 7.5 mg at bedtime on Sun/Tues/Wed/Fri and 10 mg on  Mon/Thurs/Sat  zonisamide (ZONEGRAN) 100 MG capsule Take 300 mg by mouth 2 (two) times daily. MORNING and EVENING   Past Medical History:  Diagnosis Date  . A-fib ([place])   . Cancer ([place])   . Diabetes mellitus without complication ([place])   . Dysrhythmia   . GI bleed 04/02/2014  . Hypertension   . Seizures [facility])    Social History   Social History  . Marital status: Married    Spouse name: N/A  . Number of children: N/A  . Years of education: N/A   Social History Main Topics  . Smoking status: Former Smoker    Quit date: 09/24/1969  . Smokeless tobacco: Never Used  . Alcohol use No  . Drug use: No  . Sexual activity: Not on file   Other Topics Concern  . Not on file   Social History Narrative  . No narrative on file   Family History  Problem Relation Age of Onset  . Diabetes Mellitus II Mother   . Prostate cancer Father   . Diabetes Mellitus II Brother    ASSESSMENT Recent Results: Lab Results  Component Value Date   INR 4.7 07/15/2017   INR 1.9 07/10/2017   INR 1.6 07/08/2017   Anticoagulation Dosing: INR as of 07/15/2017 and Previous Warfarin Dosing Information    INR Dt INR Goal [employer] Sun Mon Tue Wed Thu Fri Sat   07/15/2017  4.7 2.0-3.0 62.5 mg 7.5 mg 10 mg 7.5 mg 10 mg 7.5 mg 10 mg 10 mg    Anticoagulation Warfarin Dose Instructions as of 07/15/2017      Total Sun Mon Tue Wed Thu Fri Sat   New Dose 45 mg 7.5 mg 0 mg 0 mg 10 mg 7.5 mg 10 mg 10 mg     (5 mg x 1.5)  -  -  (5 mg x 2)  (5 mg x 1.5)  (5 mg x 2)  (5 mg x 2)                           INR today: Supratherapeutic  PLAN Weekly dose was held until INR recheck planned in 2 days  Patient Instructions  Patient educated about medication as defined in this encounter and verbalized understanding by repeating back instructions provided.   Patient advised to contact clinic or seek medical attention if signs/symptoms of bleeding or thromboembolism occur.  Patient verbalized understanding by  repeating back information and was advised to contact me if further medication-related questions arise. Patient was also provided an information handout.  Follow-up Return in about 2 days (around 07/17/2017).  [name]

## 2017-07-17 ENCOUNTER — Ambulatory Visit (INDEPENDENT_AMBULATORY_CARE_PROVIDER_SITE_OTHER): Payer: Medicare Other | Admitting: Pharmacist

## 2017-07-17 DIAGNOSIS — E1122 Type 2 diabetes mellitus with diabetic chronic kidney disease: Secondary | ICD-10-CM | POA: Diagnosis not present

## 2017-07-17 DIAGNOSIS — Z7901 Long term (current) use of anticoagulants: Secondary | ICD-10-CM

## 2017-07-17 DIAGNOSIS — I482 Chronic atrial fibrillation: Secondary | ICD-10-CM | POA: Diagnosis not present

## 2017-07-17 DIAGNOSIS — I4891 Unspecified atrial fibrillation: Secondary | ICD-10-CM

## 2017-07-17 DIAGNOSIS — I129 Hypertensive chronic kidney disease with stage 1 through stage 4 chronic kidney disease, or unspecified chronic kidney disease: Secondary | ICD-10-CM | POA: Diagnosis not present

## 2017-07-17 DIAGNOSIS — N183 Chronic kidney disease, stage 3 (moderate): Secondary | ICD-10-CM | POA: Diagnosis not present

## 2017-07-17 LAB — POCT INR: INR: 3.2

## 2017-07-17 NOTE — Patient Instructions (Signed)
Patient educated about medication as defined in this encounter and verbalized understanding by repeating back instructions provided.   

## 2017-07-17 NOTE — Progress Notes (Signed)
Anticoagulation Management Andrew [name] is a 83 y.o. male who is being monitored on warfarin therapy. KellieO'Brian, HHN with [facility] completed assessment and called in results today.  Indication: atrial fibrillation Duration: indefinite  Anticoagulation Clinic Visit History: Patient does not report signs/symptoms of bleeding or thromboembolism   Anticoagulation Episode Summary    Current INR goal:   2.0-3.0  TTR:   -  Next INR check:   07/22/2017  INR from last check:   3.2! (07/17/2017)  Weekly max warfarin dose:     Target end date:     INR check location:     Preferred lab:     Send INR reminders to:      Indications   Atrial fibrillation ([place]) [I48.91]       Comments:          Allergies  Allergen Reactions  . Tegretol [Carbamazepine] Other (See Comments)    Made patient's feet swell  . Depakote [Divalproex Sodium] Rash  . Dilantin [Phenytoin Sodium Extended] Rash  . Phenobarbital Rash   Medication Sig  acetaminophen (TYLENOL) 325 MG tablet Take 2 tablets (650 mg total) by mouth every 6 (six) hours as needed for mild pain (or Fever >/= 101).  Cholecalciferol (VITAMIN D-3) 1000 units CAPS Take 1,000 Units by mouth daily.  hydrocortisone cream 1 % Apply 1 application topically every 4 (four) hours.  metFORMIN (GLUCOPHAGE) 500 MG tablet Take 1 tablet (500 mg total) by mouth as directed. Patient taking differently: Take 500 mg by mouth at bedtime.   metoprolol tartrate (LOPRESSOR) 25 MG tablet Take 12.5 mg by mouth at bedtime.   Multiple Vitamins-Minerals (ONE-A-DAY MENS 50+ ADVANTAGE) TABS Take 1 tablet by mouth at bedtime.  vitamin B-12 (CYANOCOBALAMIN) 1000 MCG tablet Take 1,000 mcg by mouth daily.  warfarin (COUMADIN) 2.5 MG tablet Take 1 tablet (2.5 mg total) by mouth daily at 6 PM. Patient not taking: Reported on 06/18/2017  warfarin (COUMADIN) 5 MG tablet Take 7.5-10 mg by mouth See admin instructions. 7.5 mg at bedtime on Sun/Tues/Wed/Fri and 10 mg on  Mon/Thurs/Sat  zonisamide (ZONEGRAN) 100 MG capsule Take 300 mg by mouth 2 (two) times daily. MORNING and EVENING   Past Medical History:  Diagnosis Date  . A-fib ([place])   . Cancer ([place])   . Diabetes mellitus without complication ([place])   . Dysrhythmia   . GI bleed 04/02/2014  . Hypertension   . Seizures [facility])    Social History   Social History  . Marital status: Married    Spouse name: N/A  . Number of children: N/A  . Years of education: N/A   Social History Main Topics  . Smoking status: Former Smoker    Quit date: 09/24/1969  . Smokeless tobacco: Never Used  . Alcohol use No  . Drug use: No  . Sexual activity: Not on file   Other Topics Concern  . Not on file   Social History Narrative  . No narrative on file   Family History  Problem Relation Age of Onset  . Diabetes Mellitus II Mother   . Prostate cancer Father   . Diabetes Mellitus II Brother    ASSESSMENT Recent Results: Lab Results  Component Value Date   INR 3.2 07/17/2017   INR 4.7 07/15/2017   INR 1.9 07/10/2017   Anticoagulation Dosing: INR as of 07/17/2017 and Previous Warfarin Dosing Information    INR Dt INR Goal [employer] Sun Mon Tue Wed Thu Fri Sat  07/17/2017 3.2 2.0-3.0 45 mg 7.5 mg 0 mg 0 mg 10 mg 7.5 mg 10 mg 10 mg    Anticoagulation Warfarin Dose Instructions as of 07/17/2017      Total Sun Mon Tue Wed Thu Fri Sat   New Dose 42.5 mg 7.5 mg 0 mg 0 mg 7.5 mg 10 mg 7.5 mg 10 mg     (5 mg x 1.5)  -  -  (5 mg x 1.5)  (5 mg x 2)  (5 mg x 1.5)  (5 mg x 2)                           INR today: Supratherapeutic  PLAN Weekly dose was decreased as listed above, re-check INR in 5 days  Patient Instructions  Patient educated about medication as defined in this encounter and verbalized understanding by repeating back instructions provided.   Patient advised to contact clinic or seek medical attention if signs/symptoms of bleeding or thromboembolism occur.  Patient verbalized  understanding by repeating back information and was advised to contact me if further medication-related questions arise. Patient was also provided an information handout.  Follow-up Return in about 5 days (around 07/22/2017).  [name]

## 2017-07-18 ENCOUNTER — Encounter: Payer: Self-pay | Admitting: Internal Medicine

## 2017-07-18 ENCOUNTER — Ambulatory Visit (INDEPENDENT_AMBULATORY_CARE_PROVIDER_SITE_OTHER): Payer: Medicare Other | Admitting: Internal Medicine

## 2017-07-18 ENCOUNTER — Ambulatory Visit (HOSPITAL_COMMUNITY)
Admission: RE | Admit: 2017-07-18 | Discharge: 2017-07-18 | Disposition: A | Discharge status: Home / Self Care | Payer: Medicare Other | Source: Ambulatory Visit | Attending: Internal Medicine | Admitting: Internal Medicine

## 2017-07-18 DIAGNOSIS — M19011 Primary osteoarthritis, right shoulder: Secondary | ICD-10-CM | POA: Diagnosis not present

## 2017-07-18 DIAGNOSIS — M25711 Osteophyte, right shoulder: Secondary | ICD-10-CM | POA: Diagnosis not present

## 2017-07-18 DIAGNOSIS — M25511 Pain in right shoulder: Secondary | ICD-10-CM | POA: Insufficient documentation

## 2017-07-18 DIAGNOSIS — Z9889 Other specified postprocedural states: Secondary | ICD-10-CM | POA: Diagnosis not present

## 2017-07-18 NOTE — Patient Instructions (Signed)
[name],  It was a pleasure to see you today. I have ordered xrays of your shoulder. Please go to the first floor radiology department to have these done. I will call you with the results. If you have any questions or concerns, call our clinic at [phone] or after hours call [phone] and ask for the internal medicine resident on call. Thank you!  - Dr. Philipp Ovens
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
## 2017-07-18 NOTE — Progress Notes (Signed)
   CC: Shoulder pain   HPI:  Andrew [name] is a 83 y.o. male with past medical history outlined below here for right shoulder pain. For the details of today's visit, please refer to the assessment and plan.  Past Medical History:  Diagnosis Date  . A-fib ([place])   . Cancer ([place])   . Diabetes mellitus without complication ([place])   . Dysrhythmia   . GI bleed 04/02/2014  . Hypertension   . Seizures ([place])     Review of Systems  Constitutional: Negative for fever.  Musculoskeletal: Negative for neck pain.     Physical Exam:  Vitals:   07/18/17 0947  BP: 133/75  Pulse: 72  Temp: 97.6 F (36.4 C)  TempSrc: Oral  SpO2: 99%  Weight: 197 lb 6.4 oz (89.5 kg)  Height: 5\' 10"  (1.778 m)    Constitutional: NAD, appears comfortable Cardiovascular: RRR, no murmurs, rubs, or gallops.  Pulmonary/Chest: CTAB, no wheezes, rales, or rhonchi.  MSK: Right shoulder flexion and abduction to 120 degrees. Empty can test, internal, & external rotation against resistance does not illicit pain. Strength intact.  Psychiatric: Normal mood and affect  Assessment & Plan:   See Encounters Tab for problem based charting.  Patient discussed with Dr. Dareen Piano

## 2017-07-19 DIAGNOSIS — I482 Chronic atrial fibrillation: Secondary | ICD-10-CM | POA: Diagnosis not present

## 2017-07-19 DIAGNOSIS — E1122 Type 2 diabetes mellitus with diabetic chronic kidney disease: Secondary | ICD-10-CM | POA: Diagnosis not present

## 2017-07-19 DIAGNOSIS — N183 Chronic kidney disease, stage 3 (moderate): Secondary | ICD-10-CM | POA: Diagnosis not present

## 2017-07-19 DIAGNOSIS — I129 Hypertensive chronic kidney disease with stage 1 through stage 4 chronic kidney disease, or unspecified chronic kidney disease: Secondary | ICD-10-CM | POA: Diagnosis not present

## 2017-07-19 NOTE — Progress Notes (Signed)
Internal Medicine Clinic Attending  Case discussed with Dr. Guilloud at the time of the visit.  We reviewed the resident's history and exam and pertinent patient test results.  I agree with the assessment, diagnosis, and plan of care documented in the resident's note.  

## 2017-07-22 ENCOUNTER — Telehealth: Payer: Self-pay

## 2017-07-22 ENCOUNTER — Encounter: Payer: Self-pay | Admitting: Pharmacist

## 2017-07-22 DIAGNOSIS — E1122 Type 2 diabetes mellitus with diabetic chronic kidney disease: Secondary | ICD-10-CM | POA: Diagnosis not present

## 2017-07-22 DIAGNOSIS — I129 Hypertensive chronic kidney disease with stage 1 through stage 4 chronic kidney disease, or unspecified chronic kidney disease: Secondary | ICD-10-CM | POA: Diagnosis not present

## 2017-07-22 DIAGNOSIS — I4891 Unspecified atrial fibrillation: Secondary | ICD-10-CM

## 2017-07-22 DIAGNOSIS — I482 Chronic atrial fibrillation: Secondary | ICD-10-CM | POA: Diagnosis not present

## 2017-07-22 DIAGNOSIS — N183 Chronic kidney disease, stage 3 (moderate): Secondary | ICD-10-CM | POA: Diagnosis not present

## 2017-07-22 LAB — POCT INR: INR: 4.9

## 2017-07-22 NOTE — Telephone Encounter (Signed)
INR 10/29 4.9, pt last took med last evening, 7.5mg  warfarin. Please call kelli at [phone]

## 2017-07-22 NOTE — Patient Instructions (Signed)
Patient educated about medication as defined in this encounter and verbalized understanding by repeating back instructions provided.   

## 2017-07-22 NOTE — Telephone Encounter (Signed)
[facility] wants to report patients INR 4.9, if you have any question pls call her

## 2017-07-22 NOTE — Progress Notes (Signed)
Anticoagulation Management Andrew [name] a 83 y.o.malewho is being monitored on warfarin therapy. KellieO'Brian, HHN with [facility] completed assessment and called in results today.  Indication: atrial fibrillation Duration: indefinite  Anticoagulation Clinic Visit History: Patient does notreport signs/symptoms of bleeding or thromboembolism   Anticoagulation Episode Summary    Current INR goal:   2.0-3.0  TTR:   0.0 % (4 d)  Next INR check:   07/24/2017  INR from last check:   4.9! (07/22/2017)  Weekly max warfarin dose:     Target end date:     INR check location:     Preferred lab:     Send INR reminders to:      Indications   Atrial fibrillation ([place]) [I48.91]       Comments:          Allergies  Allergen Reactions  . Tegretol [Carbamazepine] Other (See Comments)    Made patient's feet swell  . Depakote [Divalproex Sodium] Rash  . Dilantin [Phenytoin Sodium Extended] Rash  . Phenobarbital Rash   Medication Sig  acetaminophen (TYLENOL) 325 MG tablet Take 2 tablets (650 mg total) by mouth every 6 (six) hours as needed for mild pain (or Fever >/= 101).  Cholecalciferol (VITAMIN D-3) 1000 units CAPS Take 1,000 Units by mouth daily.  hydrocortisone cream 1 % Apply 1 application topically every 4 (four) hours.  metFORMIN (GLUCOPHAGE) 500 MG tablet Take 1 tablet (500 mg total) by mouth as directed. Patient taking differently: Take 500 mg by mouth at bedtime.   metoprolol tartrate (LOPRESSOR) 25 MG tablet Take 12.5 mg by mouth at bedtime.   Multiple Vitamins-Minerals (ONE-A-DAY MENS 50+ ADVANTAGE) TABS Take 1 tablet by mouth at bedtime.  vitamin B-12 (CYANOCOBALAMIN) 1000 MCG tablet Take 1,000 mcg by mouth daily.  warfarin (COUMADIN) 2.5 MG tablet Take 1 tablet (2.5 mg total) by mouth daily at 6 PM. Patient not taking: Reported on 06/18/2017  warfarin (COUMADIN) 5 MG tablet Take 7.5-10 mg by mouth See admin instructions. 7.5 mg at bedtime on Sun/Tues/Wed/Fri and  10 mg on Mon/Thurs/Sat  zonisamide (ZONEGRAN) 100 MG capsule Take 300 mg by mouth 2 (two) times daily. MORNING and EVENING   Past Medical History:  Diagnosis Date  . A-fib ([place])   . Cancer ([place])   . Diabetes mellitus without complication ([place])   . Dysrhythmia   . GI bleed 04/02/2014  . Hypertension   . Seizures [facility])    Social History   Social History  . Marital status: Married    Spouse name: N/A  . Number of children: N/A  . Years of education: N/A   Social History Main Topics  . Smoking status: Former Smoker    Quit date: 09/24/1969  . Smokeless tobacco: Never Used  . Alcohol use No  . Drug use: No  . Sexual activity: Not on file   Other Topics Concern  . Not on file   Social History Narrative  . No narrative on file   Family History  Problem Relation Age of Onset  . Diabetes Mellitus II Mother   . Prostate cancer Father   . Diabetes Mellitus II Brother    ASSESSMENT Recent Results: Lab Results  Component Value Date   INR 4.9 07/22/2017   INR 3.2 07/17/2017   INR 4.7 07/15/2017   Anticoagulation Dosing: INR as of 07/22/2017 and Previous Warfarin Dosing Information    INR Dt INR Goal [employer] Sun Mon Tue Wed Thu Fri Sat   07/22/2017  4.9 2.0-3.0 42.5 mg 7.5 mg 0 mg 0 mg 7.5 mg 10 mg 7.5 mg 10 mg    Anticoagulation Warfarin Dose Instructions as of 07/22/2017      Total Sun Mon Tue Wed Thu Fri Sat   New Dose 42.5 mg 7.5 mg 0 mg 0 mg 7.5 mg 10 mg 7.5 mg 10 mg     (5 mg x 1.5)  -  -  (5 mg x 1.5)  (5 mg x 2)  (5 mg x 1.5)  (5 mg x 2)                           INR today: Supratherapeutic  PLAN Hold dose until INR re-check in 2 days. Patient states his multivitamin may be interacting, although he has taken the multivitamin for at least 1 month (not a new start), will stop multivitamin for now since he has had 2 supratherapeutic INRs of unknown cause.  Patient Instructions  Patient educated about medication as defined in this encounter and verbalized  understanding by repeating back instructions provided.   Patient advised to contact clinic or seek medical attention if signs/symptoms of bleeding or thromboembolism occur.  Patient verbalized understanding by repeating back information and was advised to contact me if further medication-related questions arise. Patient was also provided an information handout.  Follow-up 07/24/17  [name]

## 2017-07-23 NOTE — Telephone Encounter (Signed)
No problem Dr. Lynnae January thank you! I just called him and he reported no signs/symptoms of concern, holding warfarin until follow up [facility] INR check tomorrow.

## 2017-07-23 NOTE — Telephone Encounter (Signed)
Dr Maudie Mercury  Are you able to address this? Thanks

## 2017-07-24 ENCOUNTER — Encounter: Payer: Self-pay | Admitting: Pharmacist

## 2017-07-24 DIAGNOSIS — I4891 Unspecified atrial fibrillation: Secondary | ICD-10-CM

## 2017-07-24 DIAGNOSIS — I129 Hypertensive chronic kidney disease with stage 1 through stage 4 chronic kidney disease, or unspecified chronic kidney disease: Secondary | ICD-10-CM | POA: Diagnosis not present

## 2017-07-24 DIAGNOSIS — N183 Chronic kidney disease, stage 3 (moderate): Secondary | ICD-10-CM | POA: Diagnosis not present

## 2017-07-24 DIAGNOSIS — I482 Chronic atrial fibrillation: Secondary | ICD-10-CM | POA: Diagnosis not present

## 2017-07-24 DIAGNOSIS — E1122 Type 2 diabetes mellitus with diabetic chronic kidney disease: Secondary | ICD-10-CM | POA: Diagnosis not present

## 2017-07-24 LAB — POCT INR: INR: 2.6

## 2017-07-24 NOTE — Patient Instructions (Signed)
Patient educated about medication as defined in this encounter and verbalized understanding by repeating back instructions provided.   

## 2017-07-24 NOTE — Progress Notes (Signed)
Anticoagulation Management Andrew [name] a 83 y.o.malewho is being monitored on warfarin therapy. KellieO'Brian, HHN with [facility] completed assessment and called in results today.  Indication: atrial fibrillation Duration: indefinite Supervising Physician: [facility] Visit History: Patient does notreport signs/symptoms of bleeding or thromboembolism, patient states his multivitamin may have caused the previously supratherapeutic INR result 2 days ago. He has been holding the multivitamin.  Anticoagulation Episode Summary    Current INR goal:   2.0-3.0  TTR:   4.8 % (6 d)  Next INR check:   07/29/2017  INR from last check:   2.6 (07/24/2017)  Weekly max warfarin dose:     Target end date:     INR check location:     Preferred lab:     Send INR reminders to:      Indications   Atrial fibrillation ([place]) [I48.91]       Comments:          Allergies  Allergen Reactions  . Tegretol [Carbamazepine] Other (See Comments)    Made patient's feet swell  . Depakote [Divalproex Sodium] Rash  . Dilantin [Phenytoin Sodium Extended] Rash  . Phenobarbital Rash   Medication Sig  acetaminophen (TYLENOL) 325 MG tablet Take 2 tablets (650 mg total) by mouth every 6 (six) hours as needed for mild pain (or Fever >/= 101).  Cholecalciferol (VITAMIN D-3) 1000 units CAPS Take 1,000 Units by mouth daily.  hydrocortisone cream 1 % Apply 1 application topically every 4 (four) hours.  metFORMIN (GLUCOPHAGE) 500 MG tablet Take 1 tablet (500 mg total) by mouth as directed. Patient taking differently: Take 500 mg by mouth at bedtime.   metoprolol tartrate (LOPRESSOR) 25 MG tablet Take 12.5 mg by mouth at bedtime.   Multiple Vitamins-Minerals (ONE-A-DAY MENS 50+ ADVANTAGE) TABS Take 1 tablet by mouth at bedtime.  vitamin B-12 (CYANOCOBALAMIN) 1000 MCG tablet Take 1,000 mcg by mouth daily.  warfarin (COUMADIN) 2.5 MG tablet Take 1 tablet (2.5 mg total) by mouth  daily at 6 PM. Patient not taking: Reported on 06/18/2017  warfarin (COUMADIN) 5 MG tablet Take 7.5-10 mg by mouth See admin instructions. 7.5 mg at bedtime on Sun/Tues/Wed/Fri and 10 mg on Mon/Thurs/Sat  zonisamide (ZONEGRAN) 100 MG capsule Take 300 mg by mouth 2 (two) times daily. MORNING and EVENING   Past Medical History:  Diagnosis Date  . A-fib ([place])   . Cancer ([place])   . Diabetes mellitus without complication ([place])   . Dysrhythmia   . GI bleed 04/02/2014  . Hypertension   . Seizures [facility])    Social History   Social History  . Marital status: Married    Spouse name: N/A  . Number of children: N/A  . Years of education: N/A   Social History Main Topics  . Smoking status: Former Smoker    Quit date: 09/24/1969  . Smokeless tobacco: Never Used  . Alcohol use No  . Drug use: No  . Sexual activity: Not on file   Other Topics Concern  . Not on file   Social History Narrative  . No narrative on file   Family History  Problem Relation Age of Onset  . Diabetes Mellitus II Mother   . Prostate cancer Father   . Diabetes Mellitus II Brother    ASSESSMENT Recent Results: Lab Results  Component Value Date   INR 2.6 07/24/2017   INR 4.9 07/22/2017   INR 3.2 07/17/2017   Anticoagulation Dosing: INR as of 07/24/2017  and Previous Warfarin Dosing Information    INR Dt INR Goal Wkly Tot Sun Mon Tue Wed Thu Fri Sat   07/24/2017 2.6 2.0-3.0 42.5 mg 7.5 mg 0 mg 0 mg 7.5 mg 10 mg 7.5 mg 10 mg    Anticoagulation Warfarin Dose Instructions as of 07/24/2017      Total Sun Mon Tue Wed Thu Fri Sat   New Dose 52.5 mg 7.5 mg 7.5 mg 7.5 mg 7.5 mg 7.5 mg 7.5 mg 7.5 mg     (5 mg x 1.5)  (5 mg x 1.5)  (5 mg x 1.5)  (5 mg x 1.5)  (5 mg x 1.5)  (5 mg x 1.5)  (5 mg x 1.5)                           INR today: Therapeutic  PLAN Warfarin was resumed at 7.5 mg daily (52.5 mg weekly, patient was previously on 62.5 mg weekly). Patient was taking a high-potency multivitamin, advised  patient to switch to a regular multivitamin, recheck INR in 5 days.  Patient Instructions  Patient educated about medication as defined in this encounter and verbalized understanding by repeating back instructions provided  Patient advised to contact clinic or seek medical attention if signs/symptoms of bleeding or thromboembolism occur.  Patient verbalized understanding by repeating back information and was advised to contact me if further medication-related questions arise. Patient was also provided an information handout.  Follow-up Return in about 5 days (around 07/29/2017).  [name]

## 2017-07-29 ENCOUNTER — Ambulatory Visit (INDEPENDENT_AMBULATORY_CARE_PROVIDER_SITE_OTHER): Payer: Medicare Other | Admitting: Pharmacist

## 2017-07-29 DIAGNOSIS — N183 Chronic kidney disease, stage 3 (moderate): Secondary | ICD-10-CM | POA: Diagnosis not present

## 2017-07-29 DIAGNOSIS — I482 Chronic atrial fibrillation: Secondary | ICD-10-CM | POA: Diagnosis not present

## 2017-07-29 DIAGNOSIS — E1122 Type 2 diabetes mellitus with diabetic chronic kidney disease: Secondary | ICD-10-CM | POA: Diagnosis not present

## 2017-07-29 DIAGNOSIS — I4891 Unspecified atrial fibrillation: Secondary | ICD-10-CM

## 2017-07-29 DIAGNOSIS — I129 Hypertensive chronic kidney disease with stage 1 through stage 4 chronic kidney disease, or unspecified chronic kidney disease: Secondary | ICD-10-CM | POA: Diagnosis not present

## 2017-07-29 DIAGNOSIS — Z7901 Long term (current) use of anticoagulants: Secondary | ICD-10-CM

## 2017-07-29 LAB — PROTIME-INR
INR: 3.23
PROTHROMBIN TIME: 32.8 s — AB (ref 11.4–15.2)

## 2017-07-29 NOTE — Patient Instructions (Signed)
Patient educated about medication as defined in this encounter and verbalized understanding by repeating back instructions provided.   

## 2017-07-29 NOTE — Progress Notes (Signed)
Anticoagulation Management Andrew [name] a 83 y.o.malewho reports to clinic for warfarin monitoring. Andrew [name], HHN with [facility] completed assessment and called in results today, his INR was 3.9 by HHN. However, Andrew [name] discovered she was using a batch of recalled test strips so patient was scheduled in clinic for lab draw for more accurate INR result.  Indication: atrial fibrillation Duration: indefinite Supervising Physician: [facility] Visit History: Patient does notreport signs/symptoms of bleeding or thromboembolism, patient states his multivitamin may have caused the previously supratherapeutic INR result last week. He has been holding the multivitamin.  Anticoagulation Episode Summary    Current INR goal:   2.0-3.0  TTR:   31.0 % (1.6 wk)  Next INR check:   08/05/2017  INR from last check:   3.23! (07/29/2017)  Weekly max warfarin dose:     Target end date:     INR check location:     Preferred lab:     Send INR reminders to:      Indications   Atrial fibrillation ([place]) [I48.91]       Comments:          Allergies  Allergen Reactions  . Tegretol [Carbamazepine] Other (See Comments)    Made patient's feet swell  . Depakote [Divalproex Sodium] Rash  . Dilantin [Phenytoin Sodium Extended] Rash  . Phenobarbital Rash   Medication Sig  acetaminophen (TYLENOL) 325 MG tablet Take 2 tablets (650 mg total) by mouth every 6 (six) hours as needed for mild pain (or Fever >/= 101).  Cholecalciferol (VITAMIN D-3) 1000 units CAPS Take 1,000 Units by mouth daily.  hydrocortisone cream 1 % Apply 1 application topically every 4 (four) hours.  metFORMIN (GLUCOPHAGE) 500 MG tablet Take 1 tablet (500 mg total) by mouth as directed. Patient taking differently: Take 500 mg by mouth at bedtime.   metoprolol tartrate (LOPRESSOR) 25 MG tablet Take 12.5 mg by mouth at bedtime.   Multiple Vitamins-Minerals (ONE-A-DAY MENS 50+ ADVANTAGE) TABS Take 1  tablet by mouth at bedtime.  vitamin B-12 (CYANOCOBALAMIN) 1000 MCG tablet Take 1,000 mcg by mouth daily.  warfarin (COUMADIN) 2.5 MG tablet Take 1 tablet (2.5 mg total) by mouth daily at 6 PM. Patient not taking: Reported on 06/18/2017  warfarin (COUMADIN) 5 MG tablet Take 7.5-10 mg by mouth See admin instructions. 7.5 mg at bedtime on Sun/Tues/Wed/Fri and 10 mg on Mon/Thurs/Sat  zonisamide (ZONEGRAN) 100 MG capsule Take 300 mg by mouth 2 (two) times daily. MORNING and EVENING   Past Medical History:  Diagnosis Date  . A-fib ([place])   . Cancer ([place])   . Diabetes mellitus without complication ([place])   . Dysrhythmia   . GI bleed 04/02/2014  . Hypertension   . Seizures ([place])    Social History   Socioeconomic History  . Marital status: Married    Spouse name: Not on file  . Number of children: Not on file  . Years of education: Not on file  . Highest education level: Not on file  Social Needs  . Financial resource strain: Not on file  . Food insecurity - worry: Not on file  . Food insecurity - inability: Not on file  . Transportation needs - medical: Not on file  . Transportation needs - non-medical: Not on file  Occupational History  . Not on file  Tobacco Use  . Smoking status: Former Smoker    Last attempt to quit: 09/24/1969    Years since quitting: 47.8  .  Smokeless tobacco: Never Used  Substance and Sexual Activity  . Alcohol use: No  . Drug use: No  . Sexual activity: Not on file  Other Topics Concern  . Not on file  Social History Narrative  . Not on file   Family History  Problem Relation Age of Onset  . Diabetes Mellitus II Mother   . Prostate cancer Father   . Diabetes Mellitus II Brother    ASSESSMENT Recent Results: Lab Results  Component Value Date   INR 3.23 07/29/2017   INR 2.6 07/24/2017   INR 4.9 07/22/2017   Anticoagulation Dosing:   INR today: Supratherapeutic  PLAN Weekly dose was decreased by 14% from 52.5 mg to 45 mg per week. Patient was  previously on 1.5 tablets (7.5 mg daily). Patient is to take 1 tablet (5 mg) today (Monday), Wednesday, and Friday; and take 1.5 tablets (7.5 mg) on Tue, Thu, Sat, Sun, re-check INR next Monday.  Patient Instructions  Patient educated about medication as defined in this encounter and verbalized understanding by repeating back instructions provided.   Patient advised to contact clinic or seek medical attention if signs/symptoms of bleeding or thromboembolism occur.  Patient verbalized understanding by repeating back information and was advised to contact me if further medication-related questions arise. Patient was also provided an information handout.  Follow-up Return in about 1 week (around 08/05/2017).  [name]

## 2017-07-31 DIAGNOSIS — I129 Hypertensive chronic kidney disease with stage 1 through stage 4 chronic kidney disease, or unspecified chronic kidney disease: Secondary | ICD-10-CM | POA: Diagnosis not present

## 2017-07-31 DIAGNOSIS — E1122 Type 2 diabetes mellitus with diabetic chronic kidney disease: Secondary | ICD-10-CM | POA: Diagnosis not present

## 2017-07-31 DIAGNOSIS — I482 Chronic atrial fibrillation: Secondary | ICD-10-CM | POA: Diagnosis not present

## 2017-07-31 DIAGNOSIS — N183 Chronic kidney disease, stage 3 (moderate): Secondary | ICD-10-CM | POA: Diagnosis not present

## 2017-07-31 NOTE — Progress Notes (Signed)
I have reviewed Dr. Julianne Rice documentation.  INR elevated and coumadin decreased.

## 2017-08-05 ENCOUNTER — Ambulatory Visit (INDEPENDENT_AMBULATORY_CARE_PROVIDER_SITE_OTHER): Payer: Medicare Other | Admitting: Pharmacist

## 2017-08-05 DIAGNOSIS — N183 Chronic kidney disease, stage 3 (moderate): Secondary | ICD-10-CM | POA: Diagnosis not present

## 2017-08-05 DIAGNOSIS — R791 Abnormal coagulation profile: Secondary | ICD-10-CM | POA: Diagnosis not present

## 2017-08-05 DIAGNOSIS — E1122 Type 2 diabetes mellitus with diabetic chronic kidney disease: Secondary | ICD-10-CM | POA: Diagnosis not present

## 2017-08-05 DIAGNOSIS — I129 Hypertensive chronic kidney disease with stage 1 through stage 4 chronic kidney disease, or unspecified chronic kidney disease: Secondary | ICD-10-CM | POA: Diagnosis not present

## 2017-08-05 DIAGNOSIS — Z7901 Long term (current) use of anticoagulants: Secondary | ICD-10-CM | POA: Diagnosis not present

## 2017-08-05 DIAGNOSIS — I4891 Unspecified atrial fibrillation: Secondary | ICD-10-CM

## 2017-08-05 DIAGNOSIS — I482 Chronic atrial fibrillation: Secondary | ICD-10-CM | POA: Diagnosis not present

## 2017-08-05 LAB — POCT INR: INR: 5.4

## 2017-08-05 NOTE — Progress Notes (Signed)
INTERNAL MEDICINE TEACHING ATTENDING ADDENDUM - Lucious Groves, DO Duration- indefinate, Indication- a fib, INR-  Lab Results  Component Value Date   INR 5.4 08/05/2017  . Agree with pharmacy recommendations as outlined in their note.

## 2017-08-05 NOTE — Patient Instructions (Signed)
Patient educated about medication as defined in this encounter and verbalized understanding by repeating back instructions provided.   

## 2017-08-05 NOTE — Progress Notes (Addendum)
Anticoagulation Management Andrew [name] a 83 y.o.malewho is on warfarin. INR was faxed in today from [place]  Indication: atrial fibrillation Duration: indefinite Supervising Physician: Joni Reining  Anticoagulation Episode Summary    Current INR goal:   2.0-3.0  TTR:   19.3 % (2.6 wk)  Next INR check:   08/07/2017  INR from last check:   5.4! (08/05/2017)  Weekly max warfarin dose:     Target end date:     INR check location:     Preferred lab:     Send INR reminders to:      Indications   Atrial fibrillation ([place]) [I48.91]       Comments:          Allergies  Allergen Reactions  . Tegretol [Carbamazepine] Other (See Comments)    Made patient's feet swell  . Depakote [Divalproex Sodium] Rash  . Dilantin [Phenytoin Sodium Extended] Rash  . Phenobarbital Rash   Medication Sig  acetaminophen (TYLENOL) 325 MG tablet Take 2 tablets (650 mg total) by mouth every 6 (six) hours as needed for mild pain (or Fever >/= 101).  Cholecalciferol (VITAMIN D-3) 1000 units CAPS Take 1,000 Units by mouth daily.  hydrocortisone cream 1 % Apply 1 application topically every 4 (four) hours.  metFORMIN (GLUCOPHAGE) 500 MG tablet Take 1 tablet (500 mg total) by mouth as directed. Patient taking differently: Take 500 mg by mouth at bedtime.   metoprolol tartrate (LOPRESSOR) 25 MG tablet Take 12.5 mg by mouth at bedtime.   Multiple Vitamins-Minerals (ONE-A-DAY MENS 50+ ADVANTAGE) TABS Take 1 tablet by mouth at bedtime.  vitamin B-12 (CYANOCOBALAMIN) 1000 MCG tablet Take 1,000 mcg by mouth daily.  warfarin (COUMADIN) 2.5 MG tablet Take 1 tablet (2.5 mg total) by mouth daily at 6 PM. Patient not taking: Reported on 06/18/2017  warfarin (COUMADIN) 5 MG tablet Take 7.5-10 mg by mouth See admin instructions. 7.5 mg at bedtime on Sun/Tues/Wed/Fri and 10 mg on Mon/Thurs/Sat  zonisamide (ZONEGRAN) 100 MG capsule Take 300 mg by mouth 2 (two) times daily. MORNING and EVENING    Past Medical History:  Diagnosis Date  . A-fib ([place])   . Cancer ([place])   . Diabetes mellitus without complication ([place])   . Dysrhythmia   . GI bleed 04/02/2014  . Hypertension   . Seizures ([place])    Social History   Socioeconomic History  . Marital status: Married    Spouse name: Not on file  . Number of children: Not on file  . Years of education: Not on file  . Highest education level: Not on file  Social Needs  . Financial resource strain: Not on file  . Food insecurity - worry: Not on file  . Food insecurity - inability: Not on file  . Transportation needs - medical: Not on file  . Transportation needs - non-medical: Not on file  Occupational History  . Not on file  Tobacco Use  . Smoking status: Former Smoker    Last attempt to quit: 09/24/1969    Years since quitting: 47.8  . Smokeless tobacco: Never Used  Substance and Sexual Activity  . Alcohol use: No  . Drug use: No  . Sexual activity: Not on file  Other Topics Concern  . Not on file  Social History Narrative  . Not on file   Family History  Problem Relation Age of Onset  . Diabetes Mellitus II Mother   . Prostate cancer Father   . Diabetes Mellitus II  Brother    ASSESSMENT Recent Results: Lab Results  Component Value Date   INR 5.4 08/05/2017   INR 3.23 07/29/2017   INR 2.6 07/24/2017   Anticoagulation Dosing: patient stated he could not remember how much warfarin he took last Monday or Tuesday, but since then has been taking 1.5 tablets (7.5 mg) daily except 2 tablets (10 mg) on Thursday. This does not match the instructions given to him at his visit on 07/29/17 (1.5 tablets daily except 1 tablet on Mon/Wed/Fri)   INR today: Supratherapeutic  PLAN Hold warfarin, patient was educated on importance of adherence. Patient verbalized understanding by repeat back. Contacted HHN, Sherolyn Buba for re-check INR in 2 days.  Patient Instructions  Patient educated about medication as defined in this  encounter and verbalized understanding by repeating back instructions provided.   Patient advised to contact clinic or seek medical attention if signs/symptoms of bleeding or thromboembolism occur.  Patient verbalized understanding by repeating back information and was advised to contact me if further medication-related questions arise. Patient was also provided an information handout.  Follow-up Return in about 2 days (around 08/07/2017).  [name]

## 2017-08-06 LAB — PROTIME-INR

## 2017-08-07 ENCOUNTER — Encounter: Payer: Self-pay | Admitting: Pharmacist

## 2017-08-07 DIAGNOSIS — E1122 Type 2 diabetes mellitus with diabetic chronic kidney disease: Secondary | ICD-10-CM | POA: Diagnosis not present

## 2017-08-07 DIAGNOSIS — I482 Chronic atrial fibrillation: Secondary | ICD-10-CM | POA: Diagnosis not present

## 2017-08-07 DIAGNOSIS — N183 Chronic kidney disease, stage 3 (moderate): Secondary | ICD-10-CM | POA: Diagnosis not present

## 2017-08-07 DIAGNOSIS — I4891 Unspecified atrial fibrillation: Secondary | ICD-10-CM

## 2017-08-07 DIAGNOSIS — I129 Hypertensive chronic kidney disease with stage 1 through stage 4 chronic kidney disease, or unspecified chronic kidney disease: Secondary | ICD-10-CM | POA: Diagnosis not present

## 2017-08-07 LAB — POCT INR: INR: 3.7

## 2017-08-07 MED ORDER — WARFARIN SODIUM 6 MG PO TABS: 6.0000 mg | ORAL_TABLET | Freq: Every day | ORAL | 0 refills | Status: DC

## 2017-08-07 NOTE — Progress Notes (Signed)
Reviewed thx DrG 

## 2017-08-07 NOTE — Addendum Note (Signed)
Addended by: Forde Dandy on: 08/07/2017 11:49 AM   Modules accepted: Orders

## 2017-08-07 NOTE — Progress Notes (Signed)
Anticoagulation Management Andrew [name] a 83 y.o.malewho is on warfarin. Patient assessment was completed by [facility], Andrew [name] with [facility] and results were called in to me today.  Indication: atrial fibrillation Duration: indefinite Supervising Physician:Andrew [name]  Patient reports no signs or symptoms of concern. Patient held 2 doses of warfarin due to INR 5.4 on Monday.  Anticoagulation Episode Summary    Current INR goal:   2.0-3.0  TTR:   17.6 % (2.9 wk)  Next INR check:   08/12/2017  INR from last check:   3.7! (08/07/2017)  Weekly max warfarin dose:     Target end date:     INR check location:     Preferred lab:     Send INR reminders to:      Indications   Atrial fibrillation ([place]) [I48.91]       Comments:          Allergies  Allergen Reactions  . Tegretol [Carbamazepine] Other (See Comments)    Made patient's feet swell  . Depakote [Divalproex Sodium] Rash  . Dilantin [Phenytoin Sodium Extended] Rash  . Phenobarbital Rash   Medication Sig  acetaminophen (TYLENOL) 325 MG tablet Take 2 tablets (650 mg total) by mouth every 6 (six) hours as needed for mild pain (or Fever >/= 101).  Cholecalciferol (VITAMIN D-3) 1000 units CAPS Take 1,000 Units by mouth daily.  hydrocortisone cream 1 % Apply 1 application topically every 4 (four) hours.  metFORMIN (GLUCOPHAGE) 500 MG tablet Take 1 tablet (500 mg total) by mouth as directed. Patient taking differently: Take 500 mg by mouth at bedtime.   metoprolol tartrate (LOPRESSOR) 25 MG tablet Take 12.5 mg by mouth at bedtime.   Multiple Vitamins-Minerals (ONE-A-DAY MENS 50+ ADVANTAGE) TABS Take 1 tablet by mouth at bedtime.  vitamin B-12 (CYANOCOBALAMIN) 1000 MCG tablet Take 1,000 mcg by mouth daily.  warfarin (COUMADIN) 2.5 MG tablet Take 1 tablet (2.5 mg total) by mouth daily at 6 PM. Patient not taking: Reported on 06/18/2017  warfarin (COUMADIN) 5 MG tablet Take 7.5-10 mg by mouth See admin  instructions. 7.5 mg at bedtime on Sun/Tues/Wed/Fri and 10 mg on Mon/Thurs/Sat  zonisamide (ZONEGRAN) 100 MG capsule Take 300 mg by mouth 2 (two) times daily. MORNING and EVENING   Past Medical History:  Diagnosis Date  . A-fib ([place])   . Cancer ([place])   . Diabetes mellitus without complication ([place])   . Dysrhythmia   . GI bleed 04/02/2014  . Hypertension   . Seizures ([place])    Social History   Socioeconomic History  . Marital status: Married    Spouse name: Not on file  . Number of children: Not on file  . Years of education: Not on file  . Highest education level: Not on file  Social Needs  . Financial resource strain: Not on file  . Food insecurity - worry: Not on file  . Food insecurity - inability: Not on file  . Transportation needs - medical: Not on file  . Transportation needs - non-medical: Not on file  Occupational History  . Not on file  Tobacco Use  . Smoking status: Former Smoker    Last attempt to quit: 09/24/1969    Years since quitting: 47.9  . Smokeless tobacco: Never Used  Substance and Sexual Activity  . Alcohol use: No  . Drug use: No  . Sexual activity: Not on file  Other Topics Concern  . Not on file  Social History Narrative  .  Not on file   Family History  Problem Relation Age of Onset  . Diabetes Mellitus II Mother   . Prostate cancer Father   . Diabetes Mellitus II Brother    ASSESSMENT Recent Results: Lab Results  Component Value Date   INR 3.7 08/07/2017   INR 5.4 08/05/2017   INR 3.23 07/29/2017   Anticoagulation Dosing: patient was previously supratherapeutic with a dosing of 45 mg per week, however, it was unclear how patient was actually taking the warfarin (patient-reported dosing was consistent with 46.5 mg per week), will re-start warfarin tomorrow with simpler dosing strategy   INR today: Supratherapeutic  PLAN Hold warfarin today, re-start tomorrow with 6 mg daily.  Patient Instructions  Patient educated about medication  as defined in this encounter and verbalized understanding by repeating back instructions provided.    Patient advised to contact clinic or seek medical attention if signs/symptoms of bleeding or thromboembolism occur.  Patient verbalized understanding by repeating back information and was advised to contact me if further medication-related questions arise. Patient was also provided an information handout.  Follow-up Return in about 5 days (around 08/12/2017).  Andrew [name]

## 2017-08-07 NOTE — Patient Instructions (Signed)
Patient educated about medication as defined in this encounter and verbalized understanding by repeating back instructions provided.   

## 2017-08-12 ENCOUNTER — Ambulatory Visit (INDEPENDENT_AMBULATORY_CARE_PROVIDER_SITE_OTHER): Payer: Medicare Other | Admitting: Pharmacist

## 2017-08-12 DIAGNOSIS — N183 Chronic kidney disease, stage 3 (moderate): Secondary | ICD-10-CM | POA: Diagnosis not present

## 2017-08-12 DIAGNOSIS — I482 Chronic atrial fibrillation: Secondary | ICD-10-CM | POA: Diagnosis not present

## 2017-08-12 DIAGNOSIS — I4891 Unspecified atrial fibrillation: Secondary | ICD-10-CM | POA: Diagnosis not present

## 2017-08-12 DIAGNOSIS — Z7901 Long term (current) use of anticoagulants: Secondary | ICD-10-CM | POA: Diagnosis not present

## 2017-08-12 DIAGNOSIS — I129 Hypertensive chronic kidney disease with stage 1 through stage 4 chronic kidney disease, or unspecified chronic kidney disease: Secondary | ICD-10-CM | POA: Diagnosis not present

## 2017-08-12 DIAGNOSIS — E1122 Type 2 diabetes mellitus with diabetic chronic kidney disease: Secondary | ICD-10-CM | POA: Diagnosis not present

## 2017-08-12 LAB — POCT INR: INR: 4.1

## 2017-08-13 NOTE — Patient Instructions (Signed)
Patient educated about medication as defined in this encounter and verbalized understanding by repeating back instructions provided.   

## 2017-08-13 NOTE — Progress Notes (Signed)
Anticoagulation Management LEGRANDE HAO a 83 y.o.malewho is on warfarin. INR was faxed in today from [place]  Indication: atrial fibrillation Duration: indefinite Supervising Physician:Erik Hoffman  Anticoagulation Episode Summary    Current INR goal:   2.0-3.0  TTR:   14.1 % (3.6 wk)  Next INR check:   08/19/2017  INR from last check:   4.1! (08/12/2017)  Weekly max warfarin dose:     Target end date:     INR check location:     Preferred lab:     Send INR reminders to:      Indications   Atrial fibrillation ([place]) [I48.91]       Comments:          Allergies  Allergen Reactions  . Tegretol [Carbamazepine] Other (See Comments)    Made patient's feet swell  . Depakote [Divalproex Sodium] Rash  . Dilantin [Phenytoin Sodium Extended] Rash  . Phenobarbital Rash   Medication Sig  acetaminophen (TYLENOL) 325 MG tablet Take 2 tablets (650 mg total) by mouth every 6 (six) hours as needed for mild pain (or Fever >/= 101).  Cholecalciferol (VITAMIN D-3) 1000 units CAPS Take 1,000 Units by mouth daily.  hydrocortisone cream 1 % Apply 1 application topically every 4 (four) hours.  metFORMIN (GLUCOPHAGE) 500 MG tablet Take 1 tablet (500 mg total) by mouth as directed. Patient taking differently: Take 500 mg by mouth at bedtime.   metoprolol tartrate (LOPRESSOR) 25 MG tablet Take 12.5 mg by mouth at bedtime.   Multiple Vitamins-Minerals (ONE-A-DAY MENS 50+ ADVANTAGE) TABS Take 1 tablet by mouth at bedtime.  vitamin B-12 (CYANOCOBALAMIN) 1000 MCG tablet Take 1,000 mcg by mouth daily.  warfarin (COUMADIN) 6 MG tablet Take 1 tablet (6 mg total) daily by mouth.  warfarin (COUMADIN) 6 MG tablet Take 1 tablet (6 mg total) daily by mouth.  zonisamide (ZONEGRAN) 100 MG capsule Take 300 mg by mouth 2 (two) times daily. MORNING and EVENING   Past Medical History:  Diagnosis Date  . A-fib ([place])   . Cancer ([place])   . Diabetes mellitus without complication ([place])    . Dysrhythmia   . GI bleed 04/02/2014  . Hypertension   . Seizures ([place])    Social History   Socioeconomic History  . Marital status: Married    Spouse name: Not on file  . Number of children: Not on file  . Years of education: Not on file  . Highest education level: Not on file  Social Needs  . Financial resource strain: Not on file  . Food insecurity - worry: Not on file  . Food insecurity - inability: Not on file  . Transportation needs - medical: Not on file  . Transportation needs - non-medical: Not on file  Occupational History  . Not on file  Tobacco Use  . Smoking status: Former Smoker    Last attempt to quit: 09/24/1969    Years since quitting: 47.9  . Smokeless tobacco: Never Used  Substance and Sexual Activity  . Alcohol use: No  . Drug use: No  . Sexual activity: Not on file  Other Topics Concern  . Not on file  Social History Narrative  . Not on file   Family History  Problem Relation Age of Onset  . Diabetes Mellitus II Mother   . Prostate cancer Father   . Diabetes Mellitus II Brother    ASSESSMENT Recent Results: Lab Results  Component Value Date   INR 4.1 08/12/2017  INR 3.7 08/07/2017   INR 5.4 08/05/2017   Anticoagulation Dosing: Description   Hold warfarin x 1 dose     INR today: Supratherapeutic, patient taking 6 mg (1 tablet) daily  PLAN Hold 1 dose, take 6 mg (1 tablet daily), except 1/2 tablets (3 mg) on Tue/Thu.  There are no Patient Instructions on file for this visit. Patient advised to contact clinic or seek medical attention if signs/symptoms of bleeding or thromboembolism occur.  Patient verbalized understanding by repeating back information and was advised to contact me if further medication-related questions arise. Patient was also provided an information handout.  Follow-up Return in about 1 week (around 08/19/2017).  [name]

## 2017-08-16 DIAGNOSIS — E1122 Type 2 diabetes mellitus with diabetic chronic kidney disease: Secondary | ICD-10-CM | POA: Diagnosis not present

## 2017-08-16 DIAGNOSIS — I482 Chronic atrial fibrillation: Secondary | ICD-10-CM | POA: Diagnosis not present

## 2017-08-16 DIAGNOSIS — I129 Hypertensive chronic kidney disease with stage 1 through stage 4 chronic kidney disease, or unspecified chronic kidney disease: Secondary | ICD-10-CM | POA: Diagnosis not present

## 2017-08-16 DIAGNOSIS — N183 Chronic kidney disease, stage 3 (moderate): Secondary | ICD-10-CM | POA: Diagnosis not present

## 2017-08-19 ENCOUNTER — Ambulatory Visit (INDEPENDENT_AMBULATORY_CARE_PROVIDER_SITE_OTHER): Payer: Medicare Other | Admitting: Pharmacist

## 2017-08-19 ENCOUNTER — Telehealth: Payer: Self-pay | Admitting: *Deleted

## 2017-08-19 DIAGNOSIS — I4891 Unspecified atrial fibrillation: Secondary | ICD-10-CM | POA: Diagnosis not present

## 2017-08-19 DIAGNOSIS — I129 Hypertensive chronic kidney disease with stage 1 through stage 4 chronic kidney disease, or unspecified chronic kidney disease: Secondary | ICD-10-CM | POA: Diagnosis not present

## 2017-08-19 DIAGNOSIS — N183 Chronic kidney disease, stage 3 (moderate): Secondary | ICD-10-CM | POA: Diagnosis not present

## 2017-08-19 DIAGNOSIS — E1122 Type 2 diabetes mellitus with diabetic chronic kidney disease: Secondary | ICD-10-CM | POA: Diagnosis not present

## 2017-08-19 DIAGNOSIS — Z7901 Long term (current) use of anticoagulants: Secondary | ICD-10-CM | POA: Diagnosis not present

## 2017-08-19 DIAGNOSIS — I482 Chronic atrial fibrillation: Secondary | ICD-10-CM | POA: Diagnosis not present

## 2017-08-19 LAB — POCT INR: INR: 2.3

## 2017-08-19 NOTE — Patient Instructions (Signed)
Patient was educated about medication as defined in this encounter and verbalized understanding by repeating back instructions provided.  

## 2017-08-19 NOTE — Telephone Encounter (Signed)
Per Lambert Keto ([facility])- patient's INR 2.3

## 2017-08-19 NOTE — Progress Notes (Signed)
INR assessment completed and called in by Juventino Slovak, [facility] with [place]. INR 2.3 on 30 mg weekly of warfarin. Continued dose without changes (3 mg daily, except 6 mg Mon/Wed/Fri). Patient verbalized understanding by repeat back

## 2017-08-21 DIAGNOSIS — N183 Chronic kidney disease, stage 3 (moderate): Secondary | ICD-10-CM | POA: Diagnosis not present

## 2017-08-21 DIAGNOSIS — I129 Hypertensive chronic kidney disease with stage 1 through stage 4 chronic kidney disease, or unspecified chronic kidney disease: Secondary | ICD-10-CM | POA: Diagnosis not present

## 2017-08-21 DIAGNOSIS — E1122 Type 2 diabetes mellitus with diabetic chronic kidney disease: Secondary | ICD-10-CM | POA: Diagnosis not present

## 2017-08-21 DIAGNOSIS — I482 Chronic atrial fibrillation: Secondary | ICD-10-CM | POA: Diagnosis not present

## 2017-08-26 ENCOUNTER — Ambulatory Visit (INDEPENDENT_AMBULATORY_CARE_PROVIDER_SITE_OTHER): Payer: Medicare Other | Admitting: Pharmacist

## 2017-08-26 DIAGNOSIS — N183 Chronic kidney disease, stage 3 (moderate): Secondary | ICD-10-CM | POA: Diagnosis not present

## 2017-08-26 DIAGNOSIS — I129 Hypertensive chronic kidney disease with stage 1 through stage 4 chronic kidney disease, or unspecified chronic kidney disease: Secondary | ICD-10-CM | POA: Diagnosis not present

## 2017-08-26 DIAGNOSIS — I482 Chronic atrial fibrillation: Secondary | ICD-10-CM | POA: Diagnosis not present

## 2017-08-26 DIAGNOSIS — Z7901 Long term (current) use of anticoagulants: Secondary | ICD-10-CM

## 2017-08-26 DIAGNOSIS — I4891 Unspecified atrial fibrillation: Secondary | ICD-10-CM | POA: Diagnosis not present

## 2017-08-26 DIAGNOSIS — E1122 Type 2 diabetes mellitus with diabetic chronic kidney disease: Secondary | ICD-10-CM | POA: Diagnosis not present

## 2017-08-26 LAB — POCT INR: INR: 2.5

## 2017-08-26 NOTE — Progress Notes (Addendum)
Kellie, HHN, completed assessment patient assessment, INR 2.5, no changes today (3 mg daily, except 6 mg Mon/Wed/Fri). Follow up in 2 weeks.

## 2017-08-26 NOTE — Addendum Note (Signed)
Addended by: Forde Dandy on: 08/26/2017 04:42 PM   Modules accepted: Orders

## 2017-08-30 ENCOUNTER — Telehealth: Payer: Self-pay

## 2017-08-30 DIAGNOSIS — E1122 Type 2 diabetes mellitus with diabetic chronic kidney disease: Secondary | ICD-10-CM | POA: Diagnosis not present

## 2017-08-30 DIAGNOSIS — I129 Hypertensive chronic kidney disease with stage 1 through stage 4 chronic kidney disease, or unspecified chronic kidney disease: Secondary | ICD-10-CM | POA: Diagnosis not present

## 2017-08-30 DIAGNOSIS — N183 Chronic kidney disease, stage 3 (moderate): Secondary | ICD-10-CM | POA: Diagnosis not present

## 2017-08-30 DIAGNOSIS — I482 Chronic atrial fibrillation: Secondary | ICD-10-CM | POA: Diagnosis not present

## 2017-08-30 NOTE — Telephone Encounter (Signed)
Yes, agree

## 2017-08-30 NOTE — Telephone Encounter (Signed)
VO to [facility] PT for 2x week for 9 weeks given to kate, PT for conditioning, safety, balance, strengthening. Do you agree?

## 2017-08-30 NOTE — Telephone Encounter (Signed)
Need verbal order for patient twice a week for 5 weeks

## 2017-09-03 ENCOUNTER — Other Ambulatory Visit: Payer: Self-pay | Admitting: Oncology

## 2017-09-03 DIAGNOSIS — I4891 Unspecified atrial fibrillation: Secondary | ICD-10-CM

## 2017-09-04 DIAGNOSIS — M1991 Primary osteoarthritis, unspecified site: Secondary | ICD-10-CM | POA: Diagnosis not present

## 2017-09-04 DIAGNOSIS — E1122 Type 2 diabetes mellitus with diabetic chronic kidney disease: Secondary | ICD-10-CM | POA: Diagnosis not present

## 2017-09-04 DIAGNOSIS — I129 Hypertensive chronic kidney disease with stage 1 through stage 4 chronic kidney disease, or unspecified chronic kidney disease: Secondary | ICD-10-CM | POA: Diagnosis not present

## 2017-09-04 DIAGNOSIS — Z9181 History of falling: Secondary | ICD-10-CM | POA: Diagnosis not present

## 2017-09-04 DIAGNOSIS — Z7901 Long term (current) use of anticoagulants: Secondary | ICD-10-CM | POA: Diagnosis not present

## 2017-09-04 DIAGNOSIS — Z87891 Personal history of nicotine dependence: Secondary | ICD-10-CM | POA: Diagnosis not present

## 2017-09-04 DIAGNOSIS — Z85038 Personal history of other malignant neoplasm of large intestine: Secondary | ICD-10-CM | POA: Diagnosis not present

## 2017-09-04 DIAGNOSIS — Z7984 Long term (current) use of oral hypoglycemic drugs: Secondary | ICD-10-CM | POA: Diagnosis not present

## 2017-09-04 DIAGNOSIS — Z9049 Acquired absence of other specified parts of digestive tract: Secondary | ICD-10-CM | POA: Diagnosis not present

## 2017-09-04 DIAGNOSIS — N183 Chronic kidney disease, stage 3 (moderate): Secondary | ICD-10-CM | POA: Diagnosis not present

## 2017-09-04 DIAGNOSIS — I482 Chronic atrial fibrillation: Secondary | ICD-10-CM | POA: Diagnosis not present

## 2017-09-06 DIAGNOSIS — N183 Chronic kidney disease, stage 3 (moderate): Secondary | ICD-10-CM | POA: Diagnosis not present

## 2017-09-06 DIAGNOSIS — Z7901 Long term (current) use of anticoagulants: Secondary | ICD-10-CM | POA: Diagnosis not present

## 2017-09-06 DIAGNOSIS — Z85038 Personal history of other malignant neoplasm of large intestine: Secondary | ICD-10-CM | POA: Diagnosis not present

## 2017-09-06 DIAGNOSIS — E1122 Type 2 diabetes mellitus with diabetic chronic kidney disease: Secondary | ICD-10-CM | POA: Diagnosis not present

## 2017-09-06 DIAGNOSIS — Z87891 Personal history of nicotine dependence: Secondary | ICD-10-CM | POA: Diagnosis not present

## 2017-09-06 DIAGNOSIS — M1991 Primary osteoarthritis, unspecified site: Secondary | ICD-10-CM | POA: Diagnosis not present

## 2017-09-06 DIAGNOSIS — I482 Chronic atrial fibrillation: Secondary | ICD-10-CM | POA: Diagnosis not present

## 2017-09-06 DIAGNOSIS — I129 Hypertensive chronic kidney disease with stage 1 through stage 4 chronic kidney disease, or unspecified chronic kidney disease: Secondary | ICD-10-CM | POA: Diagnosis not present

## 2017-09-06 DIAGNOSIS — Z9049 Acquired absence of other specified parts of digestive tract: Secondary | ICD-10-CM | POA: Diagnosis not present

## 2017-09-06 DIAGNOSIS — Z9181 History of falling: Secondary | ICD-10-CM | POA: Diagnosis not present

## 2017-09-06 DIAGNOSIS — Z7984 Long term (current) use of oral hypoglycemic drugs: Secondary | ICD-10-CM | POA: Diagnosis not present

## 2017-09-09 ENCOUNTER — Ambulatory Visit (INDEPENDENT_AMBULATORY_CARE_PROVIDER_SITE_OTHER): Payer: Medicare Other | Admitting: Pharmacist

## 2017-09-09 DIAGNOSIS — I4891 Unspecified atrial fibrillation: Secondary | ICD-10-CM

## 2017-09-09 DIAGNOSIS — Z7901 Long term (current) use of anticoagulants: Secondary | ICD-10-CM | POA: Diagnosis not present

## 2017-09-09 LAB — POCT INR: INR: 2.1

## 2017-09-09 NOTE — Progress Notes (Signed)
Anticoagulation Management Andrew [name] is a 83 y.o. male who reports to the clinic for monitoring of warfarin treatment.    Indication: atrial fibrillation  Duration: indefinite Supervising physician: [facility] Visit History: Patient does not report signs/symptoms of bleeding or thromboembolism  Other recent changes: No diet, medications, lifestyle changes endorsed by the patient to me at this visit.  Anticoagulation Episode Summary    Current INR goal:   2.0-3.0  TTR:   51.0 % (1.8 mo)  Next INR check:   09/30/2017  INR from last check:   2.10 (09/09/2017)  Weekly max warfarin dose:     Target end date:     INR check location:     Preferred lab:     Send INR reminders to:      Indications   Atrial fibrillation ([place]) [I48.91]       Comments:           Allergies  Allergen Reactions  . Tegretol [Carbamazepine] Other (See Comments)    Made patient's feet swell  . Depakote [Divalproex Sodium] Rash  . Dilantin [Phenytoin Sodium Extended] Rash  . Phenobarbital Rash   Prior to Admission medications   Medication Sig Start Date End Date Taking? Authorizing Provider  acetaminophen (TYLENOL) 325 MG tablet Take 2 tablets (650 mg total) by mouth every 6 (six) hours as needed for mild pain (or Fever >/= 101). 06/20/17  Yes Welford Roche, MD  Cholecalciferol (VITAMIN D-3) 1000 units CAPS Take 1,000 Units by mouth daily.   Yes [provider]  hydrocortisone cream 1 % Apply 1 application topically every 4 (four) hours.   Yes [provider]  metFORMIN (GLUCOPHAGE) 500 MG tablet Take 1 tablet (500 mg total) by mouth as directed. Patient taking differently: Take 500 mg by mouth at bedtime.  04/06/14  Yes Sid Falcon, MD  metoprolol tartrate (LOPRESSOR) 25 MG tablet Take 12.5 mg by mouth at bedtime.    Yes [provider]  Multiple Vitamins-Minerals (ONE-A-DAY MENS 50+ ADVANTAGE) TABS Take 1 tablet by mouth at bedtime.    Yes [provider]  vitamin B-12 (CYANOCOBALAMIN) 1000 MCG tablet Take 1,000 mcg by mouth daily.   Yes [provider]  warfarin (COUMADIN) 6 MG tablet Take 6 mg one tablet Monday, Wednesday, and Friday. Take 3 mg one half tablet all other days. 09/04/17  Yes Bartholomew Crews, MD  zonisamide (ZONEGRAN) 100 MG capsule Take 300 mg by mouth 2 (two) times daily. MORNING and EVENING   Yes [provider]   Past Medical History:  Diagnosis Date  . A-fib ([place])   . Cancer ([place])   . Diabetes mellitus without complication ([place])   . Dysrhythmia   . GI bleed 04/02/2014  . Hypertension   . Seizures ([place])    Social History   Socioeconomic History  . Marital status: Married    Spouse name: Not on file  . Number of children: Not on file  . Years of education: Not on file  . Highest education level: Not on file  Social Needs  . Financial resource strain: Not on file  . Food insecurity - worry: Not on file  . Food insecurity - inability: Not on file  . Transportation needs - medical: Not on file  . Transportation needs - non-medical: Not on file  Occupational History  . Not on file  Tobacco Use  . Smoking status: Former Smoker    Last attempt to quit: 09/24/1969  Years since quitting: 47.9  . Smokeless tobacco: Never Used  Substance and Sexual Activity  . Alcohol use: No  . Drug use: No  . Sexual activity: Not on file  Other Topics Concern  . Not on file  Social History Narrative  . Not on file   Family History  Problem Relation Age of Onset  . Diabetes Mellitus II Mother   . Prostate cancer Father   . Diabetes Mellitus II Brother     ASSESSMENT Recent Results: The most recent result is correlated with 30 mg per week: Lab Results  Component Value Date   INR 2.10 09/09/2017   INR 2.5 08/26/2017   INR 2.3 08/19/2017    Anticoagulation Dosing: Description   Take 1 tablet of your 6mg  teal-colored warfarin tablets on Sundays, Tuesdays, Thursdays  and Saturdays. On Mondays, Wednesdays and Fridays--take only one-half (1/2) tablet of your 6mg  teal-colored warfarin tablets.      INR today: Therapeutic  PLAN Weekly dose was increased by 10% to 33 mg per week  Patient Instructions  Patient instructed to take medications as defined in the Anti-coagulation Track section of this encounter.  Patient instructed to take today's dose.  Patient instructed to take 1 tablet of your 6mg  teal-colored warfarin tablets on Sundays, Tuesdays, Thursdays and Saturdays. On Mondays, Wednesdays and Fridays--take only one-half (1/2) tablet of your 6mg  teal-colored warfarin tablets.  Patient verbalized understanding of these instructions.     Patient advised to contact clinic or seek medical attention if signs/symptoms of bleeding or thromboembolism occur.  Patient verbalized understanding by repeating back information and was advised to contact me if further medication-related questions arise. Patient was also provided an information handout.  Follow-up Return in 3 weeks (on 09/30/2017) for Follow up INR at 1130h.  Pennie Banter, PharmD, CACP, CPP  15 minutes spent face-to-face with the patient during the encounter. 50% of time spent on education. 50% of time was spent on fingerstick point of care INR sample collection, processing, results determination, dose adjustment and documentation in [email or web address].

## 2017-09-09 NOTE — Patient Instructions (Signed)
Patient instructed to take medications as defined in the Anti-coagulation Track section of this encounter.  Patient instructed to take today's dose.  Patient instructed to take 1 tablet of your 6mg  teal-colored warfarin tablets on Sundays, Tuesdays, Thursdays and Saturdays. On Mondays, Wednesdays and Fridays--take only one-half (1/2) tablet of your 6mg  teal-colored warfarin tablets.  Patient verbalized understanding of these instructions.

## 2017-09-10 DIAGNOSIS — Z7901 Long term (current) use of anticoagulants: Secondary | ICD-10-CM | POA: Diagnosis not present

## 2017-09-10 DIAGNOSIS — Z9049 Acquired absence of other specified parts of digestive tract: Secondary | ICD-10-CM | POA: Diagnosis not present

## 2017-09-10 DIAGNOSIS — E1122 Type 2 diabetes mellitus with diabetic chronic kidney disease: Secondary | ICD-10-CM | POA: Diagnosis not present

## 2017-09-10 DIAGNOSIS — Z85038 Personal history of other malignant neoplasm of large intestine: Secondary | ICD-10-CM | POA: Diagnosis not present

## 2017-09-10 DIAGNOSIS — Z87891 Personal history of nicotine dependence: Secondary | ICD-10-CM | POA: Diagnosis not present

## 2017-09-10 DIAGNOSIS — I129 Hypertensive chronic kidney disease with stage 1 through stage 4 chronic kidney disease, or unspecified chronic kidney disease: Secondary | ICD-10-CM | POA: Diagnosis not present

## 2017-09-10 DIAGNOSIS — Z9181 History of falling: Secondary | ICD-10-CM | POA: Diagnosis not present

## 2017-09-10 DIAGNOSIS — Z7984 Long term (current) use of oral hypoglycemic drugs: Secondary | ICD-10-CM | POA: Diagnosis not present

## 2017-09-10 DIAGNOSIS — M1991 Primary osteoarthritis, unspecified site: Secondary | ICD-10-CM | POA: Diagnosis not present

## 2017-09-10 DIAGNOSIS — N183 Chronic kidney disease, stage 3 (moderate): Secondary | ICD-10-CM | POA: Diagnosis not present

## 2017-09-10 DIAGNOSIS — I482 Chronic atrial fibrillation: Secondary | ICD-10-CM | POA: Diagnosis not present

## 2017-09-13 DIAGNOSIS — E1122 Type 2 diabetes mellitus with diabetic chronic kidney disease: Secondary | ICD-10-CM | POA: Diagnosis not present

## 2017-09-13 DIAGNOSIS — Z7901 Long term (current) use of anticoagulants: Secondary | ICD-10-CM | POA: Diagnosis not present

## 2017-09-13 DIAGNOSIS — M1991 Primary osteoarthritis, unspecified site: Secondary | ICD-10-CM | POA: Diagnosis not present

## 2017-09-13 DIAGNOSIS — Z9181 History of falling: Secondary | ICD-10-CM | POA: Diagnosis not present

## 2017-09-13 DIAGNOSIS — N183 Chronic kidney disease, stage 3 (moderate): Secondary | ICD-10-CM | POA: Diagnosis not present

## 2017-09-13 DIAGNOSIS — Z85038 Personal history of other malignant neoplasm of large intestine: Secondary | ICD-10-CM | POA: Diagnosis not present

## 2017-09-13 DIAGNOSIS — I129 Hypertensive chronic kidney disease with stage 1 through stage 4 chronic kidney disease, or unspecified chronic kidney disease: Secondary | ICD-10-CM | POA: Diagnosis not present

## 2017-09-13 DIAGNOSIS — Z9049 Acquired absence of other specified parts of digestive tract: Secondary | ICD-10-CM | POA: Diagnosis not present

## 2017-09-13 DIAGNOSIS — Z7984 Long term (current) use of oral hypoglycemic drugs: Secondary | ICD-10-CM | POA: Diagnosis not present

## 2017-09-13 DIAGNOSIS — I482 Chronic atrial fibrillation: Secondary | ICD-10-CM | POA: Diagnosis not present

## 2017-09-13 DIAGNOSIS — Z87891 Personal history of nicotine dependence: Secondary | ICD-10-CM | POA: Diagnosis not present

## 2017-09-16 DIAGNOSIS — Z85038 Personal history of other malignant neoplasm of large intestine: Secondary | ICD-10-CM | POA: Diagnosis not present

## 2017-09-16 DIAGNOSIS — I482 Chronic atrial fibrillation: Secondary | ICD-10-CM | POA: Diagnosis not present

## 2017-09-16 DIAGNOSIS — E1122 Type 2 diabetes mellitus with diabetic chronic kidney disease: Secondary | ICD-10-CM | POA: Diagnosis not present

## 2017-09-16 DIAGNOSIS — Z7984 Long term (current) use of oral hypoglycemic drugs: Secondary | ICD-10-CM | POA: Diagnosis not present

## 2017-09-16 DIAGNOSIS — Z87891 Personal history of nicotine dependence: Secondary | ICD-10-CM | POA: Diagnosis not present

## 2017-09-16 DIAGNOSIS — M1991 Primary osteoarthritis, unspecified site: Secondary | ICD-10-CM | POA: Diagnosis not present

## 2017-09-16 DIAGNOSIS — I129 Hypertensive chronic kidney disease with stage 1 through stage 4 chronic kidney disease, or unspecified chronic kidney disease: Secondary | ICD-10-CM | POA: Diagnosis not present

## 2017-09-16 DIAGNOSIS — N183 Chronic kidney disease, stage 3 (moderate): Secondary | ICD-10-CM | POA: Diagnosis not present

## 2017-09-16 DIAGNOSIS — Z9181 History of falling: Secondary | ICD-10-CM | POA: Diagnosis not present

## 2017-09-16 DIAGNOSIS — Z9049 Acquired absence of other specified parts of digestive tract: Secondary | ICD-10-CM | POA: Diagnosis not present

## 2017-09-16 DIAGNOSIS — Z7901 Long term (current) use of anticoagulants: Secondary | ICD-10-CM | POA: Diagnosis not present

## 2017-09-18 DIAGNOSIS — Z9181 History of falling: Secondary | ICD-10-CM | POA: Diagnosis not present

## 2017-09-18 DIAGNOSIS — I129 Hypertensive chronic kidney disease with stage 1 through stage 4 chronic kidney disease, or unspecified chronic kidney disease: Secondary | ICD-10-CM | POA: Diagnosis not present

## 2017-09-18 DIAGNOSIS — N183 Chronic kidney disease, stage 3 (moderate): Secondary | ICD-10-CM | POA: Diagnosis not present

## 2017-09-18 DIAGNOSIS — Z7901 Long term (current) use of anticoagulants: Secondary | ICD-10-CM | POA: Diagnosis not present

## 2017-09-18 DIAGNOSIS — E1122 Type 2 diabetes mellitus with diabetic chronic kidney disease: Secondary | ICD-10-CM | POA: Diagnosis not present

## 2017-09-18 DIAGNOSIS — I482 Chronic atrial fibrillation: Secondary | ICD-10-CM | POA: Diagnosis not present

## 2017-09-18 DIAGNOSIS — Z9049 Acquired absence of other specified parts of digestive tract: Secondary | ICD-10-CM | POA: Diagnosis not present

## 2017-09-18 DIAGNOSIS — M1991 Primary osteoarthritis, unspecified site: Secondary | ICD-10-CM | POA: Diagnosis not present

## 2017-09-18 DIAGNOSIS — Z87891 Personal history of nicotine dependence: Secondary | ICD-10-CM | POA: Diagnosis not present

## 2017-09-18 DIAGNOSIS — Z85038 Personal history of other malignant neoplasm of large intestine: Secondary | ICD-10-CM | POA: Diagnosis not present

## 2017-09-18 DIAGNOSIS — Z7984 Long term (current) use of oral hypoglycemic drugs: Secondary | ICD-10-CM | POA: Diagnosis not present

## 2017-09-23 DIAGNOSIS — Z9181 History of falling: Secondary | ICD-10-CM | POA: Diagnosis not present

## 2017-09-23 DIAGNOSIS — Z9049 Acquired absence of other specified parts of digestive tract: Secondary | ICD-10-CM | POA: Diagnosis not present

## 2017-09-23 DIAGNOSIS — E1122 Type 2 diabetes mellitus with diabetic chronic kidney disease: Secondary | ICD-10-CM | POA: Diagnosis not present

## 2017-09-23 DIAGNOSIS — N183 Chronic kidney disease, stage 3 (moderate): Secondary | ICD-10-CM | POA: Diagnosis not present

## 2017-09-23 DIAGNOSIS — Z85038 Personal history of other malignant neoplasm of large intestine: Secondary | ICD-10-CM | POA: Diagnosis not present

## 2017-09-23 DIAGNOSIS — M1991 Primary osteoarthritis, unspecified site: Secondary | ICD-10-CM | POA: Diagnosis not present

## 2017-09-23 DIAGNOSIS — Z7984 Long term (current) use of oral hypoglycemic drugs: Secondary | ICD-10-CM | POA: Diagnosis not present

## 2017-09-23 DIAGNOSIS — I129 Hypertensive chronic kidney disease with stage 1 through stage 4 chronic kidney disease, or unspecified chronic kidney disease: Secondary | ICD-10-CM | POA: Diagnosis not present

## 2017-09-23 DIAGNOSIS — Z87891 Personal history of nicotine dependence: Secondary | ICD-10-CM | POA: Diagnosis not present

## 2017-09-23 DIAGNOSIS — Z7901 Long term (current) use of anticoagulants: Secondary | ICD-10-CM | POA: Diagnosis not present

## 2017-09-23 DIAGNOSIS — I482 Chronic atrial fibrillation: Secondary | ICD-10-CM | POA: Diagnosis not present

## 2017-09-25 DIAGNOSIS — Z9049 Acquired absence of other specified parts of digestive tract: Secondary | ICD-10-CM | POA: Diagnosis not present

## 2017-09-25 DIAGNOSIS — Z87891 Personal history of nicotine dependence: Secondary | ICD-10-CM | POA: Diagnosis not present

## 2017-09-25 DIAGNOSIS — Z9181 History of falling: Secondary | ICD-10-CM | POA: Diagnosis not present

## 2017-09-25 DIAGNOSIS — Z7901 Long term (current) use of anticoagulants: Secondary | ICD-10-CM | POA: Diagnosis not present

## 2017-09-25 DIAGNOSIS — Z85038 Personal history of other malignant neoplasm of large intestine: Secondary | ICD-10-CM | POA: Diagnosis not present

## 2017-09-25 DIAGNOSIS — N183 Chronic kidney disease, stage 3 (moderate): Secondary | ICD-10-CM | POA: Diagnosis not present

## 2017-09-25 DIAGNOSIS — I482 Chronic atrial fibrillation: Secondary | ICD-10-CM | POA: Diagnosis not present

## 2017-09-25 DIAGNOSIS — I129 Hypertensive chronic kidney disease with stage 1 through stage 4 chronic kidney disease, or unspecified chronic kidney disease: Secondary | ICD-10-CM | POA: Diagnosis not present

## 2017-09-25 DIAGNOSIS — Z7984 Long term (current) use of oral hypoglycemic drugs: Secondary | ICD-10-CM | POA: Diagnosis not present

## 2017-09-25 DIAGNOSIS — M1991 Primary osteoarthritis, unspecified site: Secondary | ICD-10-CM | POA: Diagnosis not present

## 2017-09-25 DIAGNOSIS — E1122 Type 2 diabetes mellitus with diabetic chronic kidney disease: Secondary | ICD-10-CM | POA: Diagnosis not present

## 2017-09-30 ENCOUNTER — Ambulatory Visit (INDEPENDENT_AMBULATORY_CARE_PROVIDER_SITE_OTHER): Payer: Medicare Other | Admitting: Pharmacist

## 2017-09-30 DIAGNOSIS — Z7901 Long term (current) use of anticoagulants: Secondary | ICD-10-CM | POA: Diagnosis not present

## 2017-09-30 DIAGNOSIS — Z9049 Acquired absence of other specified parts of digestive tract: Secondary | ICD-10-CM | POA: Diagnosis not present

## 2017-09-30 DIAGNOSIS — N183 Chronic kidney disease, stage 3 (moderate): Secondary | ICD-10-CM | POA: Diagnosis not present

## 2017-09-30 DIAGNOSIS — Z85038 Personal history of other malignant neoplasm of large intestine: Secondary | ICD-10-CM | POA: Diagnosis not present

## 2017-09-30 DIAGNOSIS — E1122 Type 2 diabetes mellitus with diabetic chronic kidney disease: Secondary | ICD-10-CM | POA: Diagnosis not present

## 2017-09-30 DIAGNOSIS — Z87891 Personal history of nicotine dependence: Secondary | ICD-10-CM | POA: Diagnosis not present

## 2017-09-30 DIAGNOSIS — I4891 Unspecified atrial fibrillation: Secondary | ICD-10-CM

## 2017-09-30 DIAGNOSIS — Z7984 Long term (current) use of oral hypoglycemic drugs: Secondary | ICD-10-CM | POA: Diagnosis not present

## 2017-09-30 DIAGNOSIS — I129 Hypertensive chronic kidney disease with stage 1 through stage 4 chronic kidney disease, or unspecified chronic kidney disease: Secondary | ICD-10-CM | POA: Diagnosis not present

## 2017-09-30 DIAGNOSIS — M1991 Primary osteoarthritis, unspecified site: Secondary | ICD-10-CM | POA: Diagnosis not present

## 2017-09-30 DIAGNOSIS — I482 Chronic atrial fibrillation: Secondary | ICD-10-CM | POA: Diagnosis not present

## 2017-09-30 DIAGNOSIS — Z9181 History of falling: Secondary | ICD-10-CM | POA: Diagnosis not present

## 2017-09-30 LAB — POCT INR: INR: 2

## 2017-09-30 NOTE — Progress Notes (Signed)
Anticoagulation Management [name] is a 84 y.o. male who reports to the clinic for monitoring of warfarin treatment.    Indication: atrial fibrillation  Duration: indefinite Supervising physician: [facility] Visit History: Patient does not report signs/symptoms of bleeding or thromboembolism  Other recent changes: No diet, medications, lifestyle changes endorsed by the patient to me at this visit.   Anticoagulation Episode Summary    Current INR goal:   2.0-3.0  TTR:   64.8 % (2.5 mo)  Next INR check:   11/11/2017  INR from last check:   2.0 (09/30/2017)  Weekly max warfarin dose:     Target end date:     INR check location:     Preferred lab:     Send INR reminders to:      Indications   Atrial fibrillation ([place]) [I48.91]       Comments:          Allergies  Allergen Reactions  . Tegretol [Carbamazepine] Other (See Comments)    Made patient's feet swell  . Depakote [Divalproex Sodium] Rash  . Dilantin [Phenytoin Sodium Extended] Rash  . Phenobarbital Rash   Medication Sig  acetaminophen (TYLENOL) 325 MG tablet Take 2 tablets (650 mg total) by mouth every 6 (six) hours as needed for mild pain (or Fever >/= 101).  Cholecalciferol (VITAMIN D-3) 1000 units CAPS Take 1,000 Units by mouth daily.  hydrocortisone cream 1 % Apply 1 application topically every 4 (four) hours.  metFORMIN (GLUCOPHAGE) 500 MG tablet Take 1 tablet (500 mg total) by mouth as directed. Patient taking differently: Take 500 mg by mouth at bedtime.   metoprolol tartrate (LOPRESSOR) 25 MG tablet Take 12.5 mg by mouth at bedtime.   Multiple Vitamins-Minerals (ONE-A-DAY MENS 50+ ADVANTAGE) TABS Take 1 tablet by mouth at bedtime.  vitamin B-12 (CYANOCOBALAMIN) 1000 MCG tablet Take 1,000 mcg by mouth daily.  warfarin (COUMADIN) 6 MG tablet Take 6 mg one tablet Monday, Wednesday, and Friday. Take 3 mg one half tablet all other days.  zonisamide (ZONEGRAN) 100 MG capsule Take 300  mg by mouth 2 (two) times daily. MORNING and EVENING   Past Medical History:  Diagnosis Date  . A-fib ([place])   . Cancer ([place])   . Diabetes mellitus without complication ([place])   . Dysrhythmia   . GI bleed 04/02/2014  . Hypertension   . Seizures ([place])    Social History   Socioeconomic History  . Marital status: Married    Spouse name: Not on file  . Number of children: Not on file  . Years of education: Not on file  . Highest education level: Not on file  Social Needs  . Financial resource strain: Not on file  . Food insecurity - worry: Not on file  . Food insecurity - inability: Not on file  . Transportation needs - medical: Not on file  . Transportation needs - non-medical: Not on file  Occupational History  . Not on file  Tobacco Use  . Smoking status: Former Smoker    Last attempt to quit: 09/24/1969    Years since quitting: 48.0  . Smokeless tobacco: Never Used  Substance and Sexual Activity  . Alcohol use: No  . Drug use: No  . Sexual activity: Not on file  Other Topics Concern  . Not on file  Social History Narrative  . Not on file   Family History  Problem Relation Age of Onset  . Diabetes Mellitus II Mother   .  Prostate cancer Father   . Diabetes Mellitus II Brother    ASSESSMENT Recent Results: Lab Results  Component Value Date   INR 2.0 09/30/2017   INR 2.10 09/09/2017   INR 2.5 08/26/2017   Anticoagulation Dosing: Description   Take 1 tablet of your 6mg  teal-colored warfarin tablets on Sundays, Tuesdays, Thursdays and Saturdays. On Mondays, Wednesdays and Fridays--take only one-half (1/2) tablet of your 6mg  teal-colored warfarin tablets.      INR today: Therapeutic  PLAN Weekly dose was unchanged. Of note, patient was taking 6 mg M,W,F instead of 3 mg.  Patient Instructions  Patient educated about medication as defined in this encounter and verbalized understanding by repeating back instructions provided.   Patient advised to contact clinic  or seek medical attention if signs/symptoms of bleeding or thromboembolism occur.  Patient verbalized understanding by repeating back information and was advised to contact me if further medication-related questions arise. Patient was also provided an information handout.  Follow-up Return in about 6 weeks (around 11/11/2017).  [name]

## 2017-09-30 NOTE — Patient Instructions (Signed)
Patient educated about medication as defined in this encounter and verbalized understanding by repeating back instructions provided.   

## 2017-10-01 MED ORDER — WARFARIN SODIUM 6 MG PO TABS: ORAL_TABLET | ORAL | 2 refills | Status: DC

## 2017-10-01 NOTE — Addendum Note (Signed)
Addended by: Forde Dandy on: 10/01/2017 11:14 AM   Modules accepted: Orders

## 2017-10-01 NOTE — Progress Notes (Signed)
I reviewed Dr. Kim's note.  INR at goal.  

## 2017-10-02 DIAGNOSIS — Z85038 Personal history of other malignant neoplasm of large intestine: Secondary | ICD-10-CM | POA: Diagnosis not present

## 2017-10-02 DIAGNOSIS — Z87891 Personal history of nicotine dependence: Secondary | ICD-10-CM | POA: Diagnosis not present

## 2017-10-02 DIAGNOSIS — E1122 Type 2 diabetes mellitus with diabetic chronic kidney disease: Secondary | ICD-10-CM | POA: Diagnosis not present

## 2017-10-02 DIAGNOSIS — Z9181 History of falling: Secondary | ICD-10-CM | POA: Diagnosis not present

## 2017-10-02 DIAGNOSIS — M1991 Primary osteoarthritis, unspecified site: Secondary | ICD-10-CM | POA: Diagnosis not present

## 2017-10-02 DIAGNOSIS — Z9049 Acquired absence of other specified parts of digestive tract: Secondary | ICD-10-CM | POA: Diagnosis not present

## 2017-10-02 DIAGNOSIS — Z7984 Long term (current) use of oral hypoglycemic drugs: Secondary | ICD-10-CM | POA: Diagnosis not present

## 2017-10-02 DIAGNOSIS — I129 Hypertensive chronic kidney disease with stage 1 through stage 4 chronic kidney disease, or unspecified chronic kidney disease: Secondary | ICD-10-CM | POA: Diagnosis not present

## 2017-10-02 DIAGNOSIS — Z7901 Long term (current) use of anticoagulants: Secondary | ICD-10-CM | POA: Diagnosis not present

## 2017-10-02 DIAGNOSIS — I482 Chronic atrial fibrillation: Secondary | ICD-10-CM | POA: Diagnosis not present

## 2017-10-02 DIAGNOSIS — N183 Chronic kidney disease, stage 3 (moderate): Secondary | ICD-10-CM | POA: Diagnosis not present

## 2017-10-07 DIAGNOSIS — Z87891 Personal history of nicotine dependence: Secondary | ICD-10-CM | POA: Diagnosis not present

## 2017-10-07 DIAGNOSIS — Z9049 Acquired absence of other specified parts of digestive tract: Secondary | ICD-10-CM | POA: Diagnosis not present

## 2017-10-07 DIAGNOSIS — Z85038 Personal history of other malignant neoplasm of large intestine: Secondary | ICD-10-CM | POA: Diagnosis not present

## 2017-10-07 DIAGNOSIS — Z7901 Long term (current) use of anticoagulants: Secondary | ICD-10-CM | POA: Diagnosis not present

## 2017-10-07 DIAGNOSIS — N183 Chronic kidney disease, stage 3 (moderate): Secondary | ICD-10-CM | POA: Diagnosis not present

## 2017-10-07 DIAGNOSIS — I129 Hypertensive chronic kidney disease with stage 1 through stage 4 chronic kidney disease, or unspecified chronic kidney disease: Secondary | ICD-10-CM | POA: Diagnosis not present

## 2017-10-07 DIAGNOSIS — Z7984 Long term (current) use of oral hypoglycemic drugs: Secondary | ICD-10-CM | POA: Diagnosis not present

## 2017-10-07 DIAGNOSIS — I482 Chronic atrial fibrillation: Secondary | ICD-10-CM | POA: Diagnosis not present

## 2017-10-07 DIAGNOSIS — E1122 Type 2 diabetes mellitus with diabetic chronic kidney disease: Secondary | ICD-10-CM | POA: Diagnosis not present

## 2017-10-07 DIAGNOSIS — M1991 Primary osteoarthritis, unspecified site: Secondary | ICD-10-CM | POA: Diagnosis not present

## 2017-10-07 DIAGNOSIS — Z9181 History of falling: Secondary | ICD-10-CM | POA: Diagnosis not present

## 2017-10-09 DIAGNOSIS — Z85038 Personal history of other malignant neoplasm of large intestine: Secondary | ICD-10-CM | POA: Diagnosis not present

## 2017-10-09 DIAGNOSIS — M1991 Primary osteoarthritis, unspecified site: Secondary | ICD-10-CM | POA: Diagnosis not present

## 2017-10-09 DIAGNOSIS — Z7984 Long term (current) use of oral hypoglycemic drugs: Secondary | ICD-10-CM | POA: Diagnosis not present

## 2017-10-09 DIAGNOSIS — Z9181 History of falling: Secondary | ICD-10-CM | POA: Diagnosis not present

## 2017-10-09 DIAGNOSIS — Z7901 Long term (current) use of anticoagulants: Secondary | ICD-10-CM | POA: Diagnosis not present

## 2017-10-09 DIAGNOSIS — Z87891 Personal history of nicotine dependence: Secondary | ICD-10-CM | POA: Diagnosis not present

## 2017-10-09 DIAGNOSIS — E1122 Type 2 diabetes mellitus with diabetic chronic kidney disease: Secondary | ICD-10-CM | POA: Diagnosis not present

## 2017-10-09 DIAGNOSIS — Z9049 Acquired absence of other specified parts of digestive tract: Secondary | ICD-10-CM | POA: Diagnosis not present

## 2017-10-09 DIAGNOSIS — N183 Chronic kidney disease, stage 3 (moderate): Secondary | ICD-10-CM | POA: Diagnosis not present

## 2017-10-09 DIAGNOSIS — I129 Hypertensive chronic kidney disease with stage 1 through stage 4 chronic kidney disease, or unspecified chronic kidney disease: Secondary | ICD-10-CM | POA: Diagnosis not present

## 2017-10-09 DIAGNOSIS — I482 Chronic atrial fibrillation: Secondary | ICD-10-CM | POA: Diagnosis not present

## 2017-10-14 DIAGNOSIS — Z87891 Personal history of nicotine dependence: Secondary | ICD-10-CM | POA: Diagnosis not present

## 2017-10-14 DIAGNOSIS — Z85038 Personal history of other malignant neoplasm of large intestine: Secondary | ICD-10-CM | POA: Diagnosis not present

## 2017-10-14 DIAGNOSIS — Z7984 Long term (current) use of oral hypoglycemic drugs: Secondary | ICD-10-CM | POA: Diagnosis not present

## 2017-10-14 DIAGNOSIS — N183 Chronic kidney disease, stage 3 (moderate): Secondary | ICD-10-CM | POA: Diagnosis not present

## 2017-10-14 DIAGNOSIS — Z7901 Long term (current) use of anticoagulants: Secondary | ICD-10-CM | POA: Diagnosis not present

## 2017-10-14 DIAGNOSIS — Z9181 History of falling: Secondary | ICD-10-CM | POA: Diagnosis not present

## 2017-10-14 DIAGNOSIS — I482 Chronic atrial fibrillation: Secondary | ICD-10-CM | POA: Diagnosis not present

## 2017-10-14 DIAGNOSIS — Z9049 Acquired absence of other specified parts of digestive tract: Secondary | ICD-10-CM | POA: Diagnosis not present

## 2017-10-14 DIAGNOSIS — I129 Hypertensive chronic kidney disease with stage 1 through stage 4 chronic kidney disease, or unspecified chronic kidney disease: Secondary | ICD-10-CM | POA: Diagnosis not present

## 2017-10-14 DIAGNOSIS — E1122 Type 2 diabetes mellitus with diabetic chronic kidney disease: Secondary | ICD-10-CM | POA: Diagnosis not present

## 2017-10-14 DIAGNOSIS — M1991 Primary osteoarthritis, unspecified site: Secondary | ICD-10-CM | POA: Diagnosis not present

## 2017-10-16 DIAGNOSIS — Z9181 History of falling: Secondary | ICD-10-CM | POA: Diagnosis not present

## 2017-10-16 DIAGNOSIS — I129 Hypertensive chronic kidney disease with stage 1 through stage 4 chronic kidney disease, or unspecified chronic kidney disease: Secondary | ICD-10-CM | POA: Diagnosis not present

## 2017-10-16 DIAGNOSIS — Z7901 Long term (current) use of anticoagulants: Secondary | ICD-10-CM | POA: Diagnosis not present

## 2017-10-16 DIAGNOSIS — Z87891 Personal history of nicotine dependence: Secondary | ICD-10-CM | POA: Diagnosis not present

## 2017-10-16 DIAGNOSIS — N183 Chronic kidney disease, stage 3 (moderate): Secondary | ICD-10-CM | POA: Diagnosis not present

## 2017-10-16 DIAGNOSIS — E1122 Type 2 diabetes mellitus with diabetic chronic kidney disease: Secondary | ICD-10-CM | POA: Diagnosis not present

## 2017-10-16 DIAGNOSIS — Z85038 Personal history of other malignant neoplasm of large intestine: Secondary | ICD-10-CM | POA: Diagnosis not present

## 2017-10-16 DIAGNOSIS — Z9049 Acquired absence of other specified parts of digestive tract: Secondary | ICD-10-CM | POA: Diagnosis not present

## 2017-10-16 DIAGNOSIS — M1991 Primary osteoarthritis, unspecified site: Secondary | ICD-10-CM | POA: Diagnosis not present

## 2017-10-16 DIAGNOSIS — Z7984 Long term (current) use of oral hypoglycemic drugs: Secondary | ICD-10-CM | POA: Diagnosis not present

## 2017-10-16 DIAGNOSIS — I482 Chronic atrial fibrillation: Secondary | ICD-10-CM | POA: Diagnosis not present

## 2017-10-21 ENCOUNTER — Ambulatory Visit (INDEPENDENT_AMBULATORY_CARE_PROVIDER_SITE_OTHER): Payer: Medicare Other | Admitting: Pharmacist

## 2017-10-21 DIAGNOSIS — I4891 Unspecified atrial fibrillation: Secondary | ICD-10-CM | POA: Diagnosis not present

## 2017-10-21 DIAGNOSIS — Z7901 Long term (current) use of anticoagulants: Secondary | ICD-10-CM

## 2017-10-21 DIAGNOSIS — Z5181 Encounter for therapeutic drug level monitoring: Secondary | ICD-10-CM

## 2017-10-21 LAB — POCT INR: INR: 2.1

## 2017-10-21 NOTE — Progress Notes (Signed)
Anticoagulation Management Andrew [name] a 84 y.o.malewho reports to the clinic for monitoring of warfarintreatment.   Indication:atrial fibrillation Duration:indefinite Supervising physician:Emily Mullen  Anticoagulation Clinic Visit History: Patientdoes notreport signs/symptoms of bleeding or thromboembolism  Other recent changes:Nodiet, medications, lifestylechanges endorsed by the patient to me at this visit.  Anticoagulation Episode Summary    Current INR goal:   2.0-3.0  TTR:   72.5 % (3.2 mo)  Next INR check:   11/11/2017  INR from last check:   2.1 (10/21/2017)  Weekly max warfarin dose:     Target end date:     INR check location:     Preferred lab:     Send INR reminders to:      Indications   Atrial fibrillation ([place]) [I48.91]       Comments:          Allergies  Allergen Reactions  . Tegretol [Carbamazepine] Other (See Comments)    Made patient's feet swell  . Depakote [Divalproex Sodium] Rash  . Dilantin [Phenytoin Sodium Extended] Rash  . Phenobarbital Rash   Medication Sig  acetaminophen (TYLENOL) 325 MG tablet Take 2 tablets (650 mg total) by mouth every 6 (six) hours as needed for mild pain (or Fever >/= 101).  Cholecalciferol (VITAMIN D-3) 1000 units CAPS Take 1,000 Units by mouth daily.  hydrocortisone cream 1 % Apply 1 application topically every 4 (four) hours.  metFORMIN (GLUCOPHAGE) 500 MG tablet Take 1 tablet (500 mg total) by mouth as directed. Patient taking differently: Take 500 mg by mouth at bedtime.   metoprolol tartrate (LOPRESSOR) 25 MG tablet Take 12.5 mg by mouth at bedtime.   Multiple Vitamins-Minerals (ONE-A-DAY MENS 50+ ADVANTAGE) TABS Take 1 tablet by mouth at bedtime.  vitamin B-12 (CYANOCOBALAMIN) 1000 MCG tablet Take 1,000 mcg by mouth daily.  warfarin (COUMADIN) 6 MG tablet Take 6 mg one tablet Monday, Wednesday, and Friday. Take 3 mg one half tablet all other days.  zonisamide (ZONEGRAN) 100 MG capsule Take 300  mg by mouth 2 (two) times daily. MORNING and EVENING   Past Medical History:  Diagnosis Date  . A-fib ([place])   . Cancer ([place])   . Diabetes mellitus without complication ([place])   . Dysrhythmia   . GI bleed 04/02/2014  . Hypertension   . Seizures ([place])    Social History   Socioeconomic History  . Marital status: Married    Spouse name: Not on file  . Number of children: Not on file  . Years of education: Not on file  . Highest education level: Not on file  Social Needs  . Financial resource strain: Not on file  . Food insecurity - worry: Not on file  . Food insecurity - inability: Not on file  . Transportation needs - medical: Not on file  . Transportation needs - non-medical: Not on file  Occupational History  . Not on file  Tobacco Use  . Smoking status: Former Smoker    Last attempt to quit: 09/24/1969    Years since quitting: 48.1  . Smokeless tobacco: Never Used  Substance and Sexual Activity  . Alcohol use: No  . Drug use: No  . Sexual activity: Not on file  Other Topics Concern  . Not on file  Social History Narrative  . Not on file   Family History  Problem Relation Age of Onset  . Diabetes Mellitus II Mother   . Prostate cancer Father   . Diabetes Mellitus II Brother  ASSESSMENT Recent Results: Lab Results  Component Value Date   INR 2.1 10/21/2017   INR 2.0 09/30/2017   INR 2.10 09/09/2017   Anticoagulation Dosing: Take 1 tablet of your 6mg  teal-colored warfarin tablets on Sundays, Tuesdays, Thursdays and Saturdays. On Mondays, Wednesdays and Fridays--take only one-half (1/2) tablet of your 6mg  teal-colored warfarin tablets.    INR today: Therapeutic  PLAN Weekly dose was unchanged  There are no Patient Instructions on file for this visit. Patient advised to contact clinic or seek medical attention if signs/symptoms of bleeding or thromboembolism occur.  Patient verbalized understanding by repeating back information and was advised to contact me  if further medication-related questions arise. Patient was also provided an information handout.  Follow-up Return in about 4 weeks (around 11/18/2017).  [name]  15 minutes spent face-to-face with the patient during the encounter. 50% of time spent on education. 50% of time was spent on assessment and plan.

## 2017-10-21 NOTE — Patient Instructions (Signed)
Patient educated about medication as defined in this encounter and verbalized understanding by repeating back instructions provided.   

## 2017-10-24 DIAGNOSIS — Z7901 Long term (current) use of anticoagulants: Secondary | ICD-10-CM | POA: Diagnosis not present

## 2017-10-24 DIAGNOSIS — Z87891 Personal history of nicotine dependence: Secondary | ICD-10-CM | POA: Diagnosis not present

## 2017-10-24 DIAGNOSIS — I129 Hypertensive chronic kidney disease with stage 1 through stage 4 chronic kidney disease, or unspecified chronic kidney disease: Secondary | ICD-10-CM | POA: Diagnosis not present

## 2017-10-24 DIAGNOSIS — Z9049 Acquired absence of other specified parts of digestive tract: Secondary | ICD-10-CM | POA: Diagnosis not present

## 2017-10-24 DIAGNOSIS — Z9181 History of falling: Secondary | ICD-10-CM | POA: Diagnosis not present

## 2017-10-24 DIAGNOSIS — M1991 Primary osteoarthritis, unspecified site: Secondary | ICD-10-CM | POA: Diagnosis not present

## 2017-10-24 DIAGNOSIS — N183 Chronic kidney disease, stage 3 (moderate): Secondary | ICD-10-CM | POA: Diagnosis not present

## 2017-10-24 DIAGNOSIS — E1122 Type 2 diabetes mellitus with diabetic chronic kidney disease: Secondary | ICD-10-CM | POA: Diagnosis not present

## 2017-10-24 DIAGNOSIS — Z7984 Long term (current) use of oral hypoglycemic drugs: Secondary | ICD-10-CM | POA: Diagnosis not present

## 2017-10-24 DIAGNOSIS — I482 Chronic atrial fibrillation: Secondary | ICD-10-CM | POA: Diagnosis not present

## 2017-10-24 DIAGNOSIS — Z85038 Personal history of other malignant neoplasm of large intestine: Secondary | ICD-10-CM | POA: Diagnosis not present

## 2017-10-25 DIAGNOSIS — I482 Chronic atrial fibrillation: Secondary | ICD-10-CM | POA: Diagnosis not present

## 2017-10-25 DIAGNOSIS — N183 Chronic kidney disease, stage 3 (moderate): Secondary | ICD-10-CM | POA: Diagnosis not present

## 2017-10-25 DIAGNOSIS — Z9049 Acquired absence of other specified parts of digestive tract: Secondary | ICD-10-CM | POA: Diagnosis not present

## 2017-10-25 DIAGNOSIS — I129 Hypertensive chronic kidney disease with stage 1 through stage 4 chronic kidney disease, or unspecified chronic kidney disease: Secondary | ICD-10-CM | POA: Diagnosis not present

## 2017-10-25 DIAGNOSIS — Z7901 Long term (current) use of anticoagulants: Secondary | ICD-10-CM | POA: Diagnosis not present

## 2017-10-25 DIAGNOSIS — Z87891 Personal history of nicotine dependence: Secondary | ICD-10-CM | POA: Diagnosis not present

## 2017-10-25 DIAGNOSIS — M1991 Primary osteoarthritis, unspecified site: Secondary | ICD-10-CM | POA: Diagnosis not present

## 2017-10-25 DIAGNOSIS — Z85038 Personal history of other malignant neoplasm of large intestine: Secondary | ICD-10-CM | POA: Diagnosis not present

## 2017-10-25 DIAGNOSIS — Z7984 Long term (current) use of oral hypoglycemic drugs: Secondary | ICD-10-CM | POA: Diagnosis not present

## 2017-10-25 DIAGNOSIS — E1122 Type 2 diabetes mellitus with diabetic chronic kidney disease: Secondary | ICD-10-CM | POA: Diagnosis not present

## 2017-10-25 DIAGNOSIS — Z9181 History of falling: Secondary | ICD-10-CM | POA: Diagnosis not present

## 2017-10-28 DIAGNOSIS — I129 Hypertensive chronic kidney disease with stage 1 through stage 4 chronic kidney disease, or unspecified chronic kidney disease: Secondary | ICD-10-CM | POA: Diagnosis not present

## 2017-10-28 DIAGNOSIS — Z87891 Personal history of nicotine dependence: Secondary | ICD-10-CM | POA: Diagnosis not present

## 2017-10-28 DIAGNOSIS — N183 Chronic kidney disease, stage 3 (moderate): Secondary | ICD-10-CM | POA: Diagnosis not present

## 2017-10-28 DIAGNOSIS — Z7901 Long term (current) use of anticoagulants: Secondary | ICD-10-CM | POA: Diagnosis not present

## 2017-10-28 DIAGNOSIS — Z7984 Long term (current) use of oral hypoglycemic drugs: Secondary | ICD-10-CM | POA: Diagnosis not present

## 2017-10-28 DIAGNOSIS — Z9049 Acquired absence of other specified parts of digestive tract: Secondary | ICD-10-CM | POA: Diagnosis not present

## 2017-10-28 DIAGNOSIS — Z85038 Personal history of other malignant neoplasm of large intestine: Secondary | ICD-10-CM | POA: Diagnosis not present

## 2017-10-28 DIAGNOSIS — I482 Chronic atrial fibrillation: Secondary | ICD-10-CM | POA: Diagnosis not present

## 2017-10-28 DIAGNOSIS — E1122 Type 2 diabetes mellitus with diabetic chronic kidney disease: Secondary | ICD-10-CM | POA: Diagnosis not present

## 2017-10-28 DIAGNOSIS — Z9181 History of falling: Secondary | ICD-10-CM | POA: Diagnosis not present

## 2017-10-28 DIAGNOSIS — M1991 Primary osteoarthritis, unspecified site: Secondary | ICD-10-CM | POA: Diagnosis not present

## 2017-11-01 DIAGNOSIS — Z9049 Acquired absence of other specified parts of digestive tract: Secondary | ICD-10-CM | POA: Diagnosis not present

## 2017-11-01 DIAGNOSIS — N183 Chronic kidney disease, stage 3 (moderate): Secondary | ICD-10-CM | POA: Diagnosis not present

## 2017-11-01 DIAGNOSIS — Z7901 Long term (current) use of anticoagulants: Secondary | ICD-10-CM | POA: Diagnosis not present

## 2017-11-01 DIAGNOSIS — I129 Hypertensive chronic kidney disease with stage 1 through stage 4 chronic kidney disease, or unspecified chronic kidney disease: Secondary | ICD-10-CM | POA: Diagnosis not present

## 2017-11-01 DIAGNOSIS — E1122 Type 2 diabetes mellitus with diabetic chronic kidney disease: Secondary | ICD-10-CM | POA: Diagnosis not present

## 2017-11-01 DIAGNOSIS — Z9181 History of falling: Secondary | ICD-10-CM | POA: Diagnosis not present

## 2017-11-01 DIAGNOSIS — M1991 Primary osteoarthritis, unspecified site: Secondary | ICD-10-CM | POA: Diagnosis not present

## 2017-11-01 DIAGNOSIS — I482 Chronic atrial fibrillation: Secondary | ICD-10-CM | POA: Diagnosis not present

## 2017-11-01 DIAGNOSIS — Z7984 Long term (current) use of oral hypoglycemic drugs: Secondary | ICD-10-CM | POA: Diagnosis not present

## 2017-11-01 DIAGNOSIS — Z87891 Personal history of nicotine dependence: Secondary | ICD-10-CM | POA: Diagnosis not present

## 2017-11-01 DIAGNOSIS — Z85038 Personal history of other malignant neoplasm of large intestine: Secondary | ICD-10-CM | POA: Diagnosis not present

## 2017-11-18 ENCOUNTER — Ambulatory Visit (INDEPENDENT_AMBULATORY_CARE_PROVIDER_SITE_OTHER): Payer: Medicare Other | Admitting: Pharmacist

## 2017-11-18 DIAGNOSIS — Z7901 Long term (current) use of anticoagulants: Secondary | ICD-10-CM

## 2017-11-18 DIAGNOSIS — Z5181 Encounter for therapeutic drug level monitoring: Secondary | ICD-10-CM

## 2017-11-18 DIAGNOSIS — I4891 Unspecified atrial fibrillation: Secondary | ICD-10-CM

## 2017-11-18 LAB — POCT INR: INR: 2.2

## 2017-11-18 MED ORDER — WARFARIN SODIUM 6 MG PO TABS: ORAL_TABLET | ORAL | 2 refills | Status: DC

## 2017-11-18 NOTE — Progress Notes (Signed)
Anticoagulation Management Andrew [name] is a 84 y.o. male who reports to the clinic for monitoring of warfarin treatment.    Indication: atrial fibrillation  Duration: indefinite Supervising physician: Gilles Chiquito  Anticoagulation Clinic Visit History: Patient does not report signs/symptoms of bleeding or thromboembolism  Other recent changes: No changes reported in diet, medications, lifestyle Anticoagulation Episode Summary    Current INR goal:   2.0-3.0  TTR:   78.7 % (4.1 mo)  Next INR check:   12/30/2017  INR from last check:   2.2 (11/18/2017)  Weekly max warfarin dose:     Target end date:     INR check location:     Preferred lab:     Send INR reminders to:   ANT[facility]   Indications   Atrial fibrillation ([place]) [I48.91]       Comments:           Allergies  Allergen Reactions  . Tegretol [Carbamazepine] Other (See Comments)    Made patient's feet swell  . Depakote [Divalproex Sodium] Rash  . Dilantin [Phenytoin Sodium Extended] Rash  . Phenobarbital Rash   Prior to Admission medications   Medication Sig Start Date End Date Taking? Authorizing Provider  acetaminophen (TYLENOL) 325 MG tablet Take 2 tablets (650 mg total) by mouth every 6 (six) hours as needed for mild pain (or Fever >/= 101). 06/20/17  Yes Welford Roche, MD  Cholecalciferol (VITAMIN D-3) 1000 units CAPS Take 1,000 Units by mouth daily.   Yes [provider]  hydrocortisone cream 1 % Apply 1 application topically every 4 (four) hours.   Yes [provider]  metFORMIN (GLUCOPHAGE) 500 MG tablet Take 1 tablet (500 mg total) by mouth as directed. Patient taking differently: Take 500 mg by mouth at bedtime.  04/06/14  Yes Sid Falcon, MD  metoprolol tartrate (LOPRESSOR) 25 MG tablet Take 12.5 mg by mouth at bedtime.    Yes [provider]  Multiple Vitamins-Minerals (ONE-A-DAY MENS 50+ ADVANTAGE) TABS Take 1 tablet by mouth at bedtime.   Yes [provider]   vitamin B-12 (CYANOCOBALAMIN) 1000 MCG tablet Take 1,000 mcg by mouth daily.   Yes [provider]  warfarin (COUMADIN) 6 MG tablet Take 6 mg one tablet Monday, Wednesday, and Friday. Take 3 mg one half tablet all other days. 10/01/17  Yes Bartholomew Crews, MD  zonisamide (ZONEGRAN) 100 MG capsule Take 300 mg by mouth 2 (two) times daily. MORNING and EVENING   Yes [provider]   Past Medical History:  Diagnosis Date  . A-fib ([name])   . Cancer ([name])   . Diabetes mellitus without complication ([name])   . Dysrhythmia   . GI bleed 04/02/2014  . Hypertension   . Seizures ([name])    Social History   Socioeconomic History  . Marital status: Married    Spouse name: Not on file  . Number of children: Not on file  . Years of education: Not on file  . Highest education level: Not on file  Social Needs  . Financial resource strain: Not on file  . Food insecurity - worry: Not on file  . Food insecurity - inability: Not on file  . Transportation needs - medical: Not on file  . Transportation needs - non-medical: Not on file  Occupational History  . Not on file  Tobacco Use  . Smoking status: Former Smoker    Last attempt to quit: 09/24/1969    Years since quitting: 48.1  .  Smokeless tobacco: Never Used  Substance and Sexual Activity  . Alcohol use: No  . Drug use: No  . Sexual activity: Not on file  Other Topics Concern  . Not on file  Social History Narrative  . Not on file   Family History  Problem Relation Age of Onset  . Diabetes Mellitus II Mother   . Prostate cancer Father   . Diabetes Mellitus II Brother     ASSESSMENT Recent Results: The most recent result is correlated with 30 mg per week: Lab Results  Component Value Date   INR 2.2 11/18/2017   INR 2.1 10/21/2017   INR 2.0 09/30/2017    Anticoagulation Dosing: Description   Take 1 tablet of your teal 6mg  warfarin tablets by mouth once daily on Mondays, Wednesdays, and Fridays; on all other  days, take 1/2 tablet by mouth once daily.     INR today: Therapeutic  PLAN Weekly dose was unchanged  Patient Instructions  Patient instructed to take medications as defined in the Anti-coagulation Track section of this encounter.  Patient instructed to take today's dose.  Patient instructed to take 1 tablet of your teal 6mg  warfarin tablets by mouth once daily on Mondays, Wednesdays, and Fridays; on all other days, take 1/2 tablet by mouth once daily. Patient verbalized understanding of these instructions.    Patient advised to contact clinic or seek medical attention if signs/symptoms of bleeding or thromboembolism occur.  Patient verbalized understanding by repeating back information and was advised to contact me if further medication-related questions arise. Patient was also provided an information handout.  Follow-up Return in 6 weeks (on 12/30/2017) for Follow up INR at 0900.  [name] Fraction, PharmD PGY1 Pharmacy Resident Pager: [phone]  15 minutes spent face-to-face with the patient during the encounter. 50% of time spent on education. 50% of time was spent on point of care INR testing, results interpretation, dose adjustment, and documentation in [facility] and [email or web address]

## 2017-11-18 NOTE — Addendum Note (Signed)
Addended by: Marney Setting on: 11/18/2017 11:58 AM   Modules accepted: Orders

## 2017-11-18 NOTE — Progress Notes (Signed)
I reviewed the coumadin clinic note.  Patient is on [facility] for Afib and INR at goal, no change to dose.

## 2017-11-18 NOTE — Patient Instructions (Signed)
Patient instructed to take medications as defined in the Anti-coagulation Track section of this encounter.  Patient instructed to take today's dose.  Patient instructed to take 1 tablet of your teal 6mg  warfarin tablets by mouth once daily on Mondays, Wednesdays, and Fridays; on all other days, take 1/2 tablet by mouth once daily. Patient verbalized understanding of these instructions.

## 2017-12-30 ENCOUNTER — Ambulatory Visit (INDEPENDENT_AMBULATORY_CARE_PROVIDER_SITE_OTHER): Payer: Medicare Other | Admitting: Pharmacist

## 2017-12-30 DIAGNOSIS — I4891 Unspecified atrial fibrillation: Secondary | ICD-10-CM | POA: Diagnosis not present

## 2017-12-30 DIAGNOSIS — Z5181 Encounter for therapeutic drug level monitoring: Secondary | ICD-10-CM | POA: Diagnosis not present

## 2017-12-30 DIAGNOSIS — Z7901 Long term (current) use of anticoagulants: Secondary | ICD-10-CM

## 2017-12-30 LAB — POCT INR: INR: 2.3

## 2017-12-30 MED ORDER — WARFARIN SODIUM 6 MG PO TABS: ORAL_TABLET | ORAL | 2 refills | Status: DC

## 2017-12-30 NOTE — Patient Instructions (Signed)
Patient instructed to take medications as defined in the Anti-coagulation Track section of this encounter.  Patient instructed to take today's dose.  Patient instructed to take 1 tablet of your teal 6mg  warfarin tablets by mouth once daily on Mondays, Wednesdays, and Fridays; on all other days, take 1/2 tablet by mouth once daily. Patient verbalized understanding of these instructions.

## 2017-12-30 NOTE — Progress Notes (Signed)
Anticoagulation Management Andrew [name] is a 84 y.o. male who reports to the clinic for monitoring of warfarin treatment.    Indication: atrial fibrillation  Duration: indefinite Supervising physician: Joni Reining  Anticoagulation Clinic Visit History: Patient does not report signs/symptoms of bleeding or thromboembolism  Other recent changes: No changes reported in diet, medications, lifestyle Anticoagulation Episode Summary    Current INR goal:   2.0-3.0  TTR:   84.1 % (5.5 mo)  Next INR check:   02/10/2018  INR from last check:   2.3 (12/30/2017)  Weekly max warfarin dose:     Target end date:     INR check location:     Preferred lab:     Send INR reminders to:   [facility]   Indications   Atrial fibrillation ([place]) [I48.91]       Comments:           Allergies  Allergen Reactions  . Tegretol [Carbamazepine] Other (See Comments)    Made patient's feet swell  . Depakote [Divalproex Sodium] Rash  . Dilantin [Phenytoin Sodium Extended] Rash  . Phenobarbital Rash   Prior to Admission medications   Medication Sig Start Date End Date Taking? Authorizing Provider  acetaminophen (TYLENOL) 325 MG tablet Take 2 tablets (650 mg total) by mouth every 6 (six) hours as needed for mild pain (or Fever >/= 101). 06/20/17  Yes Welford Roche, MD  Cholecalciferol (VITAMIN D-3) 1000 units CAPS Take 1,000 Units by mouth daily.   Yes [provider]  hydrocortisone cream 1 % Apply 1 application topically every 4 (four) hours.   Yes [provider]  metFORMIN (GLUCOPHAGE) 500 MG tablet Take 1 tablet (500 mg total) by mouth as directed. Patient taking differently: Take 500 mg by mouth at bedtime.  04/06/14  Yes Sid Falcon, MD  metoprolol tartrate (LOPRESSOR) 25 MG tablet Take 12.5 mg by mouth at bedtime.    Yes [provider]  Multiple Vitamins-Minerals (ONE-A-DAY MENS 50+ ADVANTAGE) TABS Take 1 tablet by mouth at bedtime.   Yes [provider]   vitamin B-12 (CYANOCOBALAMIN) 1000 MCG tablet Take 1,000 mcg by mouth daily.   Yes [provider]  warfarin (COUMADIN) 6 MG tablet Take 6 mg one tablet Monday, Wednesday, and Friday. Take 3 mg one half tablet all other days. 11/18/17  Yes Sid Falcon, MD  zonisamide (ZONEGRAN) 100 MG capsule Take 300 mg by mouth 2 (two) times daily. MORNING and EVENING   Yes [provider]   Past Medical History:  Diagnosis Date  . A-fib ([place])   . Cancer ([place])   . Diabetes mellitus without complication ([place])   . Dysrhythmia   . GI bleed 04/02/2014  . Hypertension   . Seizures ([place])    Social History   Socioeconomic History  . Marital status: Married    Spouse name: Not on file  . Number of children: Not on file  . Years of education: Not on file  . Highest education level: Not on file  Occupational History  . Not on file  Social Needs  . Financial resource strain: Not on file  . Food insecurity:    Worry: Not on file    Inability: Not on file  . Transportation needs:    Medical: Not on file    Non-medical: Not on file  Tobacco Use  . Smoking status: Former Smoker    Last attempt to quit: 09/24/1969    Years since quitting: 48.2  .  Smokeless tobacco: Never Used  Substance and Sexual Activity  . Alcohol use: No  . Drug use: No  . Sexual activity: Not on file  Lifestyle  . Physical activity:    Days per week: Not on file    Minutes per session: Not on file  . Stress: Not on file  Relationships  . Social connections:    Talks on phone: Not on file    Gets together: Not on file    Attends religious service: Not on file    Active member of club or organization: Not on file    Attends meetings of clubs or organizations: Not on file    Relationship status: Not on file  Other Topics Concern  . Not on file  Social History Narrative  . Not on file   Family History  Problem Relation Age of Onset  . Diabetes Mellitus II Mother   . Prostate cancer Father   .  Diabetes Mellitus II Brother     ASSESSMENT Recent Results: The most recent result is correlated with 30 mg per week: Lab Results  Component Value Date   INR 2.3 12/30/2017   INR 2.2 11/18/2017   INR 2.1 10/21/2017    Anticoagulation Dosing: Description   Take 1 tablet of your teal 6mg  warfarin tablets by mouth once daily on Mondays, Wednesdays, and Fridays; on all other days, take 1/2 tablet by mouth once daily.     INR today: Therapeutic  PLAN Weekly dose was unchanged  Patient Instructions  Patient instructed to take medications as defined in the Anti-coagulation Track section of this encounter.  Patient instructed to take today's dose.  Patient instructed to take 1 tablet of your teal 6mg  warfarin tablets by mouth once daily on Mondays, Wednesdays, and Fridays; on all other days, take 1/2 tablet by mouth once daily. Patient verbalized understanding of these instructions.   Patient advised to contact clinic or seek medical attention if signs/symptoms of bleeding or thromboembolism occur.  Patient verbalized understanding by repeating back information and was advised to contact me if further medication-related questions arise. Patient was also provided an information handout.  Follow-up Return in 6 weeks (on 02/10/2018) for Follow up INR at 0900.  [name] Fraction, PharmD PGY1 Pharmacy Resident Pager: [phone]  15 minutes spent face-to-face with the patient during the encounter. 50% of time spent on education. 50% of time was spent on point of care INR testing, results interpretation, dose adjustment, and documentation in [facility] and [email or web address]

## 2017-12-30 NOTE — Addendum Note (Signed)
Addended by: Marney Setting on: 12/30/2017 09:07 AM   Modules accepted: Orders

## 2018-01-06 NOTE — Progress Notes (Signed)
INTERNAL MEDICINE TEACHING ATTENDING ADDENDUM - Andrew Groves, DO Duration- indefinate, Indication- a fib, INR-  Lab Results  Component Value Date   INR 2.3 12/30/2017  . Agree with pharmacy recommendations as outlined in their note.

## 2018-02-03 ENCOUNTER — Ambulatory Visit (INDEPENDENT_AMBULATORY_CARE_PROVIDER_SITE_OTHER): Payer: Medicare Other | Admitting: Internal Medicine

## 2018-02-03 ENCOUNTER — Ambulatory Visit (INDEPENDENT_AMBULATORY_CARE_PROVIDER_SITE_OTHER): Payer: Medicare Other | Admitting: Pharmacist

## 2018-02-03 ENCOUNTER — Other Ambulatory Visit: Payer: Self-pay

## 2018-02-03 VITALS — BP 111/53 | HR 67 | Temp 98.1°F | Ht 70.0 in | Wt 186.0 lb

## 2018-02-03 DIAGNOSIS — Z5181 Encounter for therapeutic drug level monitoring: Secondary | ICD-10-CM

## 2018-02-03 DIAGNOSIS — Z7901 Long term (current) use of anticoagulants: Secondary | ICD-10-CM

## 2018-02-03 DIAGNOSIS — Z820 Family history of epilepsy and other diseases of the nervous system: Secondary | ICD-10-CM

## 2018-02-03 DIAGNOSIS — Z8639 Personal history of other endocrine, nutritional and metabolic disease: Secondary | ICD-10-CM

## 2018-02-03 DIAGNOSIS — Z79899 Other long term (current) drug therapy: Secondary | ICD-10-CM

## 2018-02-03 DIAGNOSIS — Z833 Family history of diabetes mellitus: Secondary | ICD-10-CM | POA: Diagnosis not present

## 2018-02-03 DIAGNOSIS — I4891 Unspecified atrial fibrillation: Secondary | ICD-10-CM

## 2018-02-03 DIAGNOSIS — E1142 Type 2 diabetes mellitus with diabetic polyneuropathy: Secondary | ICD-10-CM | POA: Diagnosis not present

## 2018-02-03 HISTORY — DX: Personal history of other endocrine, nutritional and metabolic disease: Z86.39

## 2018-02-03 LAB — POCT INR: INR: 2.3

## 2018-02-03 LAB — POCT GLYCOSYLATED HEMOGLOBIN (HGB A1C): Hemoglobin A1C: 5.6

## 2018-02-03 LAB — GLUCOSE, CAPILLARY: Glucose-Capillary: 112 mg/dL — ABNORMAL HIGH (ref 65–99)

## 2018-02-03 MED ORDER — GABAPENTIN 100 MG PO CAPS: 100.0000 mg | ORAL_CAPSULE | Freq: Every day | ORAL | 1 refills | Status: DC

## 2018-02-03 NOTE — Progress Notes (Signed)
Anticoagulation Management Andrew [name] is a 84 y.o. male who reports to the clinic for monitoring of warfarin treatment.    Indication: atrial fibrillation   Duration: indefinite Supervising physician: Lenice Pressman, MD  Anticoagulation Clinic Visit History: Patient does not report signs/symptoms of bleeding or thromboembolism  Other recent changes: No diet, medications, lifestyle changes endorsed by the patient to me at this visit.  Anticoagulation Episode Summary    Current INR goal:   2.0-3.0  TTR:   86.9 % (6.7 mo)  Next INR check:   03/03/2018  INR from last check:   2.30 (02/03/2018)  Weekly max warfarin dose:     Target end date:     INR check location:     Preferred lab:     Send INR reminders to:   [facility]   Indications   Atrial fibrillation ([place]) [I48.91]       Comments:           Allergies  Allergen Reactions  . Tegretol [Carbamazepine] Other (See Comments)    Made patient's feet swell  . Depakote [Divalproex Sodium] Rash  . Dilantin [Phenytoin Sodium Extended] Rash  . Phenobarbital Rash   Prior to Admission medications   Medication Sig Start Date End Date Taking? Authorizing Provider  acetaminophen (TYLENOL) 325 MG tablet Take 2 tablets (650 mg total) by mouth every 6 (six) hours as needed for mild pain (or Fever >/= 101). 06/20/17  Yes Welford Roche, MD  Cholecalciferol (VITAMIN D-3) 1000 units CAPS Take 1,000 Units by mouth daily.   Yes [provider]  hydrocortisone cream 1 % Apply 1 application topically every 4 (four) hours.   Yes [provider]  metFORMIN (GLUCOPHAGE) 500 MG tablet Take 1 tablet (500 mg total) by mouth as directed. Patient taking differently: Take 500 mg by mouth at bedtime.  04/06/14  Yes Sid Falcon, MD  metoprolol tartrate (LOPRESSOR) 25 MG tablet Take 12.5 mg by mouth at bedtime.    Yes [provider]  Multiple Vitamins-Minerals (ONE-A-DAY MENS 50+ ADVANTAGE) TABS Take 1 tablet by  mouth at bedtime.   Yes [provider]  vitamin B-12 (CYANOCOBALAMIN) 1000 MCG tablet Take 1,000 mcg by mouth daily.   Yes [provider]  warfarin (COUMADIN) 6 MG tablet Take 6 mg one tablet Monday, Wednesday, and Friday. Take 3 mg one half tablet all other days. 12/30/17  Yes Lucious Groves, DO  zonisamide (ZONEGRAN) 100 MG capsule Take 300 mg by mouth 2 (two) times daily. MORNING and EVENING   Yes [provider]   Past Medical History:  Diagnosis Date  . A-fib ([place])   . Cancer ([place])   . Diabetes mellitus without complication ([place])   . Dysrhythmia   . GI bleed 04/02/2014  . Hypertension   . Seizures ([place])    Social History   Socioeconomic History  . Marital status: Married    Spouse name: Not on file  . Number of children: Not on file  . Years of education: Not on file  . Highest education level: Not on file  Occupational History  . Not on file  Social Needs  . Financial resource strain: Not on file  . Food insecurity:    Worry: Not on file    Inability: Not on file  . Transportation needs:    Medical: Not on file    Non-medical: Not on file  Tobacco Use  . Smoking status: Former Smoker    Last attempt to  quit: 09/24/1969    Years since quitting: 48.3  . Smokeless tobacco: Never Used  Substance and Sexual Activity  . Alcohol use: No  . Drug use: No  . Sexual activity: Not on file  Lifestyle  . Physical activity:    Days per week: Not on file    Minutes per session: Not on file  . Stress: Not on file  Relationships  . Social connections:    Talks on phone: Not on file    Gets together: Not on file    Attends religious service: Not on file    Active member of club or organization: Not on file    Attends meetings of clubs or organizations: Not on file    Relationship status: Not on file  Other Topics Concern  . Not on file  Social History Narrative  . Not on file   Family History  Problem Relation Age of Onset  . Diabetes Mellitus  II Mother   . Prostate cancer Father   . Diabetes Mellitus II Brother     ASSESSMENT Recent Results: The most recent result is correlated with 30 mg per week: Lab Results  Component Value Date   INR 2.30 02/03/2018   INR 2.3 12/30/2017   INR 2.2 11/18/2017    Anticoagulation Dosing: Description   Take 1 tablet of your teal 6mg  warfarin tablets by mouth once daily on Mondays, Wednesdays, and Fridays; on all other days, take 1/2 tablet by mouth once daily.     INR today: Therapeutic  PLAN Weekly dose was unchanged.  Patient Instructions  Patient instructed to take medications as defined in the Anti-coagulation Track section of this encounter.  Patient instructed to take today's dose.  Patient instructed to take  1 tablet of your teal 6mg  warfarin tablets by mouth once daily on Mondays, Wednesdays, and Fridays; on all other days, take 1/2 tablet by mouth once daily. Patient verbalized understanding of these instructions.     Patient advised to contact clinic or seek medical attention if signs/symptoms of bleeding or thromboembolism occur.  Patient verbalized understanding by repeating back information and was advised to contact me if further medication-related questions arise. Patient was also provided an information handout.  Follow-up Return in 1 month (on 03/03/2018) for Follow up INR at 0900h.  Pennie Banter, PharmD, CACP, CPP  15 minutes spent face-to-face with the patient during the encounter. 50% of time spent on education. 50% of time was spent on fingerstick point of care INR sample collection, processing, results determination, and documentation in [email or web address].

## 2018-02-03 NOTE — Assessment & Plan Note (Signed)
Neuropathic pain: Patient presents today for evaluation of a 2 to 3-year history of neuropathic pain involving the feet to ankles and hands proximally to the wrist.  This pain is not improved with acetaminophen, it does not improve with NSAIDs, it does not improve with wrist splinting, is not improved with compression gloves or stockings, and it has progressed slowly since onset.  Patient denies ulceration of the feet or hands, and complete loss of sensation seen that he is able to feel his feet but feels that he is as if he is walking on cotton or clouds.  He denies overall weakness but tested to mild lower extremity weakness during ambulation.  Patient denied nausea, vomiting, diarrhea, constipation, chest pain, abdominal pain, headache, visual changes, fever, chills, dysuria, hematuria, or myalgias. He endorsed family history of diabetes and neuropathy.  Plan: We will evaluate a hemoglobin A1c today Patient stated today that he had lab work completed recently at the [place].  We have requested they have this information released to our clinic Initiated with gabapentin 100 mg nightly. Return in 2 weeks for evaluation of his neuropathic pain medication titration as indicated.

## 2018-02-03 NOTE — Progress Notes (Signed)
INTERNAL MEDICINE TEACHING ATTENDING ADDENDUM  I agree with pharmacy recommendations as outlined in their note.   Alexander N Raines, MD  

## 2018-02-03 NOTE — Patient Instructions (Addendum)
FOLLOW-UP INSTRUCTIONS When: Please stop at the front desk and schedule with Dr. Lynnae January at her next available appointment. In addition, please schedule in two weeks for evaluation of the medication effectiveness.  For: Routine visit and follow-up for neuropathy  What to bring: All of your medications  I will initiate a low dose therapy for neuropathy and have you follow up in 2 weeks for evaluation of effectiveness.   I will start you on a low dose of gabapentin 100mg  daily. Please return in two weeks for evaluation of your pain. Also, please schedule with Dr. Lynnae January at her next appointment time.   Thank you for your visit to the [facility] today.

## 2018-02-03 NOTE — Progress Notes (Signed)
   CC: Hand and foot pain  HPI:[name] is a 84 y.o. male who presents today for evaluation of his neuropathic hand and foot pain.  Neuropathic pain: Patient presents today for evaluation of a 2 to 3-year history of neuropathic pain involving the feet to ankles and hands proximally to the wrist.  This pain is not improved with acetaminophen, it does not improve with NSAIDs, it does not improve with wrist splinting, is not improved with compression gloves or stockings, and it has progressed slowly since onset.  Patient denies ulceration of the feet or hands, and complete loss of sensation seen that he is able to feel his feet but feels that he is as if he is walking on cotton or clouds.  He denies overall weakness but tested to mild lower extremity weakness during ambulation.  Patient denied nausea, vomiting, diarrhea, constipation, chest pain, abdominal pain, headache, visual changes, fever, chills, dysuria, hematuria, or myalgias. He endorsed family history of diabetes and neuropathy.  Plan: We will evaluate a hemoglobin A1c today Patient stated today that he had lab work completed recently at the [place].  We have requested they have this information released to our clinic Initiated with gabapentin 100 mg nightly. Return in 2 weeks for evaluation of his neuropathic pain medication titration as indicated. May need to consider nerve conduction study, B12  Past Medical History:  Diagnosis Date  . A-fib ([place])   . Cancer ([place])   . Diabetes mellitus without complication ([place])   . Dysrhythmia   . GI bleed 04/02/2014  . Hypertension   . Seizures ([place])    Review of Systems:  ROS negative except as per HPI.  Physical Exam:  Vitals:   02/03/18 0913  BP: (!) 111/53  Pulse: 67  Temp: 98.1 F (36.7 C)  TempSrc: Oral  SpO2: 100%  Weight: 186 lb (84.4 kg)  Height: 5\' 10"  (1.778 m)   Physical Exam  Constitutional: He appears well-developed and well-nourished. No distress.    Cardiovascular: Normal rate and regular rhythm.  No murmur heard. Pulmonary/Chest: Effort normal and breath sounds normal. No stridor. No respiratory distress.  Musculoskeletal: He exhibits no edema or tenderness.  Neurological: He is alert.  Evaluation of the patient's neuropathy revealed decreased two-point discrimination but sensation intact single-point of the hands and feet bilaterally and symmetric.  Pain sensory intact.  No loss of strength with 5 out of 5 upper and symmetric, 5/5 lower extensors and symmetric with 4/5 flexor strength symmetrically.   Skin: Skin is warm. No rash noted. He is not diaphoretic. No pallor.  Psychiatric: He has a normal mood and affect.  Vitals reviewed.  Assessment & Plan:   See Encounters Tab for problem based charting.  Patient discussed with Dr. Rebeca Alert

## 2018-02-03 NOTE — Patient Instructions (Signed)
Patient instructed to take medications as defined in the Anti-coagulation Track section of this encounter.  Patient instructed to take today's dose.  Patient instructed to take  1 tablet of your teal 6mg  warfarin tablets by mouth once daily on Mondays, Wednesdays, and Fridays; on all other days, take 1/2 tablet by mouth once daily. Patient verbalized understanding of these instructions.

## 2018-02-05 NOTE — Progress Notes (Signed)
Internal Medicine Clinic Attending  Case discussed with Dr. Berline Lopes  at the time of the visit.  We reviewed the resident's history and exam and pertinent patient test results.  I agree with the assessment, diagnosis, and plan of care documented in the resident's note.  Andrew [name] is here for neuropathy. Unfortunately, he seems to get most of his primary care at the [place], mostly coming to our clinic for warfarin management. 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 However, other etiologies can present similarly, including alcohol, B12 deficiency, HIV, syphilis, and even paraneoplastic syndromes. A1C today is surprisingly low, which does not rule out diabetic neuropathy, but indicates further investigation may be warranted. Will try to obtain records from [place] and fill in gaps in evaluation. However, would also be helpful for him to decide where he would like to have his primary care, as the current situation is inefficient and will lead to fractured and ineffective care.   Oda Kilts, MD

## 2018-02-10 ENCOUNTER — Ambulatory Visit: Payer: Medicare Other

## 2018-02-18 ENCOUNTER — Other Ambulatory Visit: Payer: Self-pay

## 2018-02-18 ENCOUNTER — Encounter: Payer: Self-pay | Admitting: Internal Medicine

## 2018-02-18 ENCOUNTER — Ambulatory Visit (INDEPENDENT_AMBULATORY_CARE_PROVIDER_SITE_OTHER): Payer: Medicare Other | Admitting: Internal Medicine

## 2018-02-18 VITALS — BP 111/57 | HR 69 | Temp 97.9°F | Ht 70.0 in | Wt 190.1 lb

## 2018-02-18 DIAGNOSIS — E1142 Type 2 diabetes mellitus with diabetic polyneuropathy: Secondary | ICD-10-CM

## 2018-02-18 DIAGNOSIS — R202 Paresthesia of skin: Secondary | ICD-10-CM | POA: Diagnosis not present

## 2018-02-18 DIAGNOSIS — G629 Polyneuropathy, unspecified: Secondary | ICD-10-CM | POA: Insufficient documentation

## 2018-02-18 DIAGNOSIS — G5693 Unspecified mononeuropathy of bilateral upper limbs: Secondary | ICD-10-CM

## 2018-02-18 HISTORY — DX: Polyneuropathy, unspecified: G62.9

## 2018-02-18 MED ORDER — GABAPENTIN 100 MG PO CAPS: 300.0000 mg | ORAL_CAPSULE | Freq: Every day | ORAL | 1 refills | Status: DC

## 2018-02-18 NOTE — Progress Notes (Signed)
   CC: Follow-up for neuropathic pain  HPI:[name] is a 84 y.o. male who presents today for evaluation of his neuropathic pain.   Neuropathic pain/paraesthesia: Bilateral upper and lower extremities to the wrist and distal ankles persistent ~2-3 years. There is minimal associated pain stating that instead it feels like he is walking on clouds with regard to his feet. The 100mg  gabapentin did not improve his sypmtoms nor did NSAIDS or splinting. He denied ulceration, fever, chills, weakness, abdominal pain, headache, visual changes, diarrhea, constipation. He stated that the pain is made worse only with meals of any size and food type. He is unable to further explain this. He does note that he was told there was perhaps a cervical disc protrusion diagnosed at the [place]. He will sign a release of records today in order to enable [place] to obtain the indicated information to better understand his neuropathy.   The distrubution is atypical for diabetic neuropathy.   Plan: EMG ordered today Increased gabapentin to 300mg  [facility] Information request completed  Past Medical History:  Diagnosis Date  . A-fib ([place])   . Cancer ([place])   . Diabetes mellitus without complication ([place])   . Dysrhythmia   . GI bleed 04/02/2014  . Hypertension   . Seizures ([place])    Review of Systems:  ROS negative except as per HPI.  Physical Exam: Vitals:   02/18/18 0854  BP: (!) 111/57  Pulse: 69  Temp: 97.9 F (36.6 C)  TempSrc: Oral  SpO2: 100%  Weight: 190 lb 1.6 oz (86.2 kg)  Height: 5\' 10"  (1.778 m)   Physical Exam  Constitutional: He is oriented to person, place, and time. He appears well-developed and well-nourished. No distress.  HENT:  Head: Normocephalic and atraumatic.  Eyes: Conjunctivae and EOM are normal.  Cardiovascular: Normal rate and regular rhythm.  No murmur heard. Pulmonary/Chest: Effort normal and breath sounds normal. He has no wheezes.  Abdominal: Soft. Bowel sounds are normal.    Musculoskeletal: He exhibits no edema or tenderness.  Neurological: He is alert and oriented to person, place, and time. No sensory deficit.  Skin: He is not diaphoretic.  Psychiatric: He has a normal mood and affect.   Assessment & Plan:   See Encounters Tab for problem based charting.  Patient discussed with Dr. Lynnae January

## 2018-02-18 NOTE — Assessment & Plan Note (Signed)
Neuropathic pain/paraesthesia: Bilateral upper and lower extremities to the wrist and distal ankles persistent ~2-3 years. There is minimal associated pain stating that instead it feels like he is walking on clouds with regard to his feet. The 100mg  gabapentin did not improve his sypmtoms nor did NSAIDS or splinting. He denied ulceration, fever, chills, weakness, abdominal pain, headache, visual changes, diarrhea, constipation. He stated that the pain is made worse only with meals of any size and food type. He is unable to further explain this. He does note that he was told there was perhaps a cervical disc protrusion diagnosed at the [place]. He will sign a release of records today in order to enable [place] to obtain the indicated information to better understand his neuropathy.   The distrubution is atypical for diabetic neuropathy.   Plan: EMG ordered today Increased gabapentin to 300mg  [facility] Information request completed

## 2018-02-18 NOTE — Patient Instructions (Addendum)
FOLLOW-UP INSTRUCTIONS When: Two weeks for evaluation of your neuropathy  What to bring: All of your medications and questions   I have ordered a nerve conduction study for today. We will obtain this to assist [place] with better understanding the numbness in your hands and feet.   Please return in two weeks to evaluate the effectiveness of your current medication increase from 100 of the gabapentin to 300 mg nightly.  Thank you for you visit the most chondromas in clinic today.  If you have any questions please feel free to call us at any time

## 2018-02-21 NOTE — Progress Notes (Signed)
Internal Medicine Clinic Attending  Case discussed with Dr. Harbrecht at the time of the visit.  We reviewed the resident's history and exam and pertinent patient test results.  I agree with the assessment, diagnosis, and plan of care documented in the resident's note.   

## 2018-03-03 ENCOUNTER — Ambulatory Visit (INDEPENDENT_AMBULATORY_CARE_PROVIDER_SITE_OTHER): Payer: Medicare Other | Admitting: Internal Medicine

## 2018-03-03 ENCOUNTER — Other Ambulatory Visit: Payer: Self-pay

## 2018-03-03 ENCOUNTER — Ambulatory Visit (INDEPENDENT_AMBULATORY_CARE_PROVIDER_SITE_OTHER): Payer: Medicare Other | Admitting: Pharmacy Technician

## 2018-03-03 VITALS — BP 120/65 | HR 60 | Temp 97.7°F | Ht 70.0 in | Wt 193.4 lb

## 2018-03-03 DIAGNOSIS — E1142 Type 2 diabetes mellitus with diabetic polyneuropathy: Secondary | ICD-10-CM | POA: Diagnosis not present

## 2018-03-03 DIAGNOSIS — Z5181 Encounter for therapeutic drug level monitoring: Secondary | ICD-10-CM

## 2018-03-03 DIAGNOSIS — Z7984 Long term (current) use of oral hypoglycemic drugs: Secondary | ICD-10-CM

## 2018-03-03 DIAGNOSIS — Z79899 Other long term (current) drug therapy: Secondary | ICD-10-CM

## 2018-03-03 DIAGNOSIS — Z7901 Long term (current) use of anticoagulants: Secondary | ICD-10-CM | POA: Diagnosis not present

## 2018-03-03 DIAGNOSIS — I4891 Unspecified atrial fibrillation: Secondary | ICD-10-CM | POA: Diagnosis not present

## 2018-03-03 DIAGNOSIS — E1121 Type 2 diabetes mellitus with diabetic nephropathy: Secondary | ICD-10-CM

## 2018-03-03 DIAGNOSIS — D649 Anemia, unspecified: Secondary | ICD-10-CM

## 2018-03-03 DIAGNOSIS — G5693 Unspecified mononeuropathy of bilateral upper limbs: Secondary | ICD-10-CM

## 2018-03-03 LAB — POCT INR: INR: 2.3 (ref 2.0–3.0)

## 2018-03-03 MED ORDER — GABAPENTIN 300 MG PO CAPS: 600.0000 mg | ORAL_CAPSULE | Freq: Every day | ORAL | 0 refills | Status: DC

## 2018-03-03 NOTE — Patient Instructions (Signed)
Patient instructed to take medications as defined in the Anti-coagulation Track section of this encounter.  Patient instructed to take today's dose.  Patient instructed to take 1 tablet of your teal 6mg  warfarin tablets by mouth once daily on Mondays, Wednesdays, and Fridays; on all other days, take 1/2 tablet by mouth once daily. Patient verbalized understanding of these instructions.

## 2018-03-03 NOTE — Patient Instructions (Addendum)
It was a pleasure to see you Andrew [name].  We are increasing your Gabapentin to 600 mg every night. You may also start 300 mg ever morning to help with your neuropathy pain during the day.  We are checking your Vitamin B12 level today.  Please follow up with Dr. Lynnae January as scheduled on 03/20/2018.

## 2018-03-03 NOTE — Progress Notes (Signed)
   CC: neuropathy  HPI:  [name] is a 84 y.o. male with past medical history is as below including atrial fibrillation, type 2 diabetes with neuropathy, and seizure disorder who presents for follow-up management of his neuropathy.  Please see problem based charting for status of patient's chronic medical issues.  Bilateral neuropathy of upper extremities Patient reports chronic neuropathy both hands and both feet for several years.  His main symptom is numbness he denies significant pain.  He was started on gabapentin 300 mg nightly on last visit and a nerve conduction study was ordered which has yet to be completed.  He has not had significant improvement with gabapentin.  He is taking a daily vitamin B12 supplement.  He has a history of anemia which was most recently normocytic with a slightly elevated RDW.  He is on metformin for his diabetes. A/P: Neuropathy of hands and feet likely secondary to diabetes.  He has diabetic shoes and is advised to wear consistently.  We will increase his gabapentin to see if this can provide symptom relief.  He denies any side effect of oversedation from his gabapentin. We will also check a vitamin B12 level. - Increase gabapentin to 600 mg qhs; can also add 300 mg in the morning - f/u B12; if low then may need alternate route of supplementation if not absorbing via GI - f/u nerve conduction study    Past Medical History:  Diagnosis Date  . A-fib ([place])   . Cancer ([place])   . Diabetes mellitus without complication ([place])   . Dysrhythmia   . GI bleed 04/02/2014  . Hypertension   . Seizures ([place])    Review of Systems:   Review of Systems  Respiratory: Negative for shortness of breath.   Cardiovascular: Positive for leg swelling. Negative for chest pain.  Musculoskeletal: Negative for falls.  Neurological: Positive for tingling. Negative for dizziness, focal weakness and loss of consciousness.       Numbness hands and feet     Physical  Exam:  Vitals:   03/03/18 0928  BP: 120/65  Pulse: 60  Temp: 97.7 F (36.5 C)  TempSrc: Oral  SpO2: 97%  Weight: 193 lb 6.4 oz (87.7 kg)  Height: 5\' 10"  (1.778 m)   Physical Exam  Constitutional: He is oriented to person, place, and time. He appears well-developed.  HENT:  Head: Normocephalic and atraumatic.  Cardiovascular: Normal rate.  Pulses:      Radial pulses are 2+ on the right side, and 2+ on the left side.  Pulmonary/Chest: Effort normal. No respiratory distress. He has no wheezes. He has no rales.  Musculoskeletal: He exhibits edema. He exhibits no tenderness.  Neurological: He is alert and oriented to person, place, and time.  Skin: Capillary refill takes less than 2 seconds. He is not diaphoretic.     Assessment & Plan:   See Encounters Tab for problem based charting.  Patient discussed with Dr. Dareen Piano

## 2018-03-03 NOTE — Assessment & Plan Note (Signed)
Patient reports chronic neuropathy both hands and both feet for several years.  His main symptom is numbness he denies significant pain.  He was started on gabapentin 300 mg nightly on last visit and a nerve conduction study was ordered which has yet to be completed.  He has not had significant improvement with gabapentin.  He is taking a daily vitamin B12 supplement.  He has a history of anemia which was most recently normocytic with a slightly elevated RDW.  He is on metformin for his diabetes. A/P: Neuropathy of hands and feet likely secondary to diabetes.  He has diabetic shoes and is advised to wear consistently.  We will increase his gabapentin to see if this can provide symptom relief.  He denies any side effect of oversedation from his gabapentin. We will also check a vitamin B12 level. - Increase gabapentin to 600 mg qhs; can also add 300 mg in the morning - f/u B12; if low then may need alternate route of supplementation if not absorbing via GI - f/u nerve conduction study

## 2018-03-03 NOTE — Progress Notes (Signed)
Anticoagulation Management Andrew [name] is a 84 y.o. male who reports to the clinic for monitoring of warfarin treatment.    Indication: atrial fibrillation  Duration: indefinite Supervising physician: [facility] Clinic Visit History: Patient does not report signs/symptoms of bleeding or thromboembolism. Other recent changes: No diet, medications, or lifestyle changes reported at this visit. Anticoagulation Episode Summary    Current INR goal:   2.0-3.0  TTR:   88.5 % (7.6 mo)  Next INR check:   03/24/2018  INR from last check:   2.3 (03/03/2018)  Weekly max warfarin dose:     Target end date:     INR check location:     Preferred lab:     Send INR reminders to:   ANT[facility]   Indications   Atrial fibrillation ([place]) [I48.91]       Comments:           Allergies  Allergen Reactions  . Tegretol [Carbamazepine] Other (See Comments)    Made patient's feet swell  . Depakote [Divalproex Sodium] Rash  . Dilantin [Phenytoin Sodium Extended] Rash  . Phenobarbital Rash   Prior to Admission medications   Medication Sig Start Date End Date Taking? Authorizing Provider  acetaminophen (TYLENOL) 325 MG tablet Take 2 tablets (650 mg total) by mouth every 6 (six) hours as needed for mild pain (or Fever >/= 101). 06/20/17  Yes Welford Roche, MD  Cholecalciferol (VITAMIN D-3) 1000 units CAPS Take 1,000 Units by mouth daily.   Yes [provider]  gabapentin (NEURONTIN) 300 MG capsule Take 2 capsules (600 mg total) by mouth at bedtime. 03/03/18  Yes Zada Finders, MD  hydrocortisone cream 1 % Apply 1 application topically every 4 (four) hours.   Yes [provider]  metFORMIN (GLUCOPHAGE) 500 MG tablet Take 1 tablet (500 mg total) by mouth as directed. Patient taking differently: Take 500 mg by mouth at bedtime.  04/06/14  Yes Sid Falcon, MD  metoprolol tartrate (LOPRESSOR) 25 MG tablet Take 12.5 mg by mouth at bedtime.    Yes [provider]  Multiple Vitamins-Minerals (ONE-A-DAY MENS 50+ ADVANTAGE) TABS Take 1 tablet by mouth at bedtime.   Yes [provider]  vitamin B-12 (CYANOCOBALAMIN) 1000 MCG tablet Take 1,000 mcg by mouth daily.   Yes [provider]  warfarin (COUMADIN) 6 MG tablet Take 6 mg one tablet Monday, Wednesday, and Friday. Take 3 mg one half tablet all other days. 12/30/17  Yes Lucious Groves, DO  zonisamide (ZONEGRAN) 100 MG capsule Take 300 mg by mouth 2 (two) times daily. MORNING and EVENING   Yes [provider]   Past Medical History:  Diagnosis Date  . A-fib ([place])   . Cancer ([place])   . Diabetes mellitus without complication ([place])   . Dysrhythmia   . GI bleed 04/02/2014  . Hypertension   . Seizures ([place])    Social History   Socioeconomic History  . Marital status: Married    Spouse name: Not on file  . Number of children: Not on file  . Years of education: Not on file  . Highest education level: Not on file  Occupational History  . Not on file  Social Needs  . Financial resource strain: Not on file  . Food insecurity:    Worry: Not on file    Inability: Not on file  . Transportation needs:    Medical: Not on file    Non-medical: Not on file  Tobacco Use  . Smoking status: Former Smoker    Last attempt to quit: 09/24/1969    Years since quitting: 48.4  . Smokeless tobacco: Never Used  Substance and Sexual Activity  . Alcohol use: No  . Drug use: No  . Sexual activity: Not on file  Lifestyle  . Physical activity:    Days per week: Not on file    Minutes per session: Not on file  . Stress: Not on file  Relationships  . Social connections:    Talks on phone: Not on file    Gets together: Not on file    Attends religious service: Not on file    Active member of club or organization: Not on file    Attends meetings of clubs or organizations: Not on file    Relationship status: Not on file  Other Topics Concern  . Not on file  Social History  Narrative  . Not on file   Family History  Problem Relation Age of Onset  . Diabetes Mellitus II Mother   . Prostate cancer Father   . Diabetes Mellitus II Brother     ASSESSMENT Recent Results: The most recent result is correlated with 30 mg per week: Lab Results  Component Value Date   INR 2.3 03/03/2018   INR 2.30 02/03/2018   INR 2.3 12/30/2017    Anticoagulation Dosing: Description   Take 1 tablet of your teal 6mg  warfarin tablets by mouth once daily on Mondays, Wednesdays, and Fridays; on all other days, take 1/2 tablet by mouth once daily.     INR today: Therapeutic  PLAN Weekly dose was unchanged   Patient Instructions  Patient instructed to take medications as defined in the Anti-coagulation Track section of this encounter.  Patient instructed to take today's dose.  Patient instructed to take 1 tablet of your teal 6mg  warfarin tablets by mouth once daily on Mondays, Wednesdays, and Fridays; on all other days, take 1/2 tablet by mouth once daily. Patient verbalized understanding of these instructions.     Patient advised to contact clinic or seek medical attention if signs/symptoms of bleeding or thromboembolism occur.  Patient verbalized understanding by repeating back information and was advised to contact me if further medication-related questions arise. Patient was also provided an information handout.  Follow-up Return in about 3 weeks (around 03/24/2018) for INR follow up at 0900.  Bertis Ruddy, PharmD Pharmacy Resident [phone] 03/03/2018 10:45 AM  15 minutes spent face-to-face with the patient during the encounter. 50% of time spent on education. 50% of time was spent on INR point of care testing, analysis, and documentation in EMR.

## 2018-03-04 ENCOUNTER — Ambulatory Visit: Payer: Medicare Other

## 2018-03-04 LAB — VITAMIN B12: Vitamin B-12: 952 pg/mL (ref 232–1245)

## 2018-03-05 NOTE — Progress Notes (Signed)
Internal Medicine Clinic Attending  Case discussed with Dr. Patel at the time of the visit.  We reviewed the resident's history and exam and pertinent patient test results.  I agree with the assessment, diagnosis, and plan of care documented in the resident's note.  

## 2018-03-05 NOTE — Progress Notes (Signed)
INTERNAL MEDICINE TEACHING ATTENDING ADDENDUM - Miroslav Gin M.D  Duration- indefinite, Indication- afib, INR- therapeutic. Agree with pharmacy recommendations as outlined in their note.     

## 2018-03-20 ENCOUNTER — Ambulatory Visit: Payer: Medicare Other | Admitting: Internal Medicine

## 2018-03-24 ENCOUNTER — Ambulatory Visit (INDEPENDENT_AMBULATORY_CARE_PROVIDER_SITE_OTHER): Payer: Medicare Other | Admitting: Pharmacist

## 2018-03-24 DIAGNOSIS — Z7901 Long term (current) use of anticoagulants: Secondary | ICD-10-CM

## 2018-03-24 DIAGNOSIS — Z5181 Encounter for therapeutic drug level monitoring: Secondary | ICD-10-CM | POA: Diagnosis not present

## 2018-03-24 DIAGNOSIS — I4891 Unspecified atrial fibrillation: Secondary | ICD-10-CM

## 2018-03-24 LAB — POCT INR: INR: 2.1 (ref 2.0–3.0)

## 2018-03-24 MED ORDER — WARFARIN SODIUM 6 MG PO TABS: ORAL_TABLET | ORAL | 2 refills | Status: DC

## 2018-03-24 NOTE — Patient Instructions (Signed)
Patient instructed to take medications as defined in the Anti-coagulation Track section of this encounter.  Patient instructed to take today's dose.  Patient instructed to take  1 tablet of your teal 6mg  warfarin tablets by mouth once daily on Mondays,Tuesdays, Wednesdays, Fridays and Saturdays; on all other days, take 1/2 tablet by mouth once daily. Patient verbalized understanding of these instructions.

## 2018-03-24 NOTE — Progress Notes (Signed)
INTERNAL MEDICINE TEACHING ATTENDING ADDENDUM - Lucious Groves, DO Duration- indefinate, Indication- afib, INR-  Lab Results  Component Value Date   INR 2.1 03/24/2018  . Agree with pharmacy recommendations as outlined in their note.

## 2018-03-24 NOTE — Progress Notes (Signed)
Anticoagulation Management Andrew [name] is a 84 y.o. male who reports to the clinic for monitoring of warfarin treatment.    Indication: atrial fibrillation, long term current use of anticoagulant. Duration: indefinite Supervising physician: Joni Reining  Anticoagulation Clinic Visit History: Patient does not report signs/symptoms of bleeding or thromboembolism  Other recent changes: No diet, medications, lifestyle changes endorsed.  Anticoagulation Episode Summary    Current INR goal:   2.0-3.0  TTR:   89.5 % (8.3 mo)  Next INR check:   04/22/2018  INR from last check:   2.1 (03/24/2018)  Weekly max warfarin dose:     Target end date:     INR check location:     Preferred lab:     Send INR reminders to:   ANT[facility]   Indications   Atrial fibrillation ([place]) [I48.91]       Comments:           Allergies  Allergen Reactions  . Tegretol [Carbamazepine] Other (See Comments)    Made patient's feet swell  . Depakote [Divalproex Sodium] Rash  . Dilantin [Phenytoin Sodium Extended] Rash  . Phenobarbital Rash   Prior to Admission medications   Medication Sig Start Date End Date Taking? Authorizing Provider  Cholecalciferol (VITAMIN D-3) 1000 units CAPS Take 1,000 Units by mouth daily.   Yes [provider]  gabapentin (NEURONTIN) 300 MG capsule Take 2 capsules (600 mg total) by mouth at bedtime. 03/03/18  Yes Zada Finders, MD  hydrocortisone cream 1 % Apply 1 application topically every 4 (four) hours.   Yes [provider]  metFORMIN (GLUCOPHAGE) 500 MG tablet Take 1 tablet (500 mg total) by mouth as directed. Patient taking differently: Take 500 mg by mouth at bedtime.  04/06/14  Yes Sid Falcon, MD  metoprolol tartrate (LOPRESSOR) 25 MG tablet Take 12.5 mg by mouth at bedtime.    Yes [provider]  Multiple Vitamins-Minerals (ONE-A-DAY MENS 50+ ADVANTAGE) TABS Take 1 tablet by mouth at bedtime.   Yes [provider]  vitamin B-12  (CYANOCOBALAMIN) 1000 MCG tablet Take 1,000 mcg by mouth daily.   Yes [provider]  warfarin (COUMADIN) 6 MG tablet Take 6 mg one tablet Monday,Tuesday, Wednesday, Friday and Saturday. Take 3 mg one half tablet all other days. 03/24/18  Yes Lucious Groves, DO  zonisamide (ZONEGRAN) 100 MG capsule Take 300 mg by mouth 2 (two) times daily. MORNING and EVENING   Yes [provider]  acetaminophen (TYLENOL) 325 MG tablet Take 2 tablets (650 mg total) by mouth every 6 (six) hours as needed for mild pain (or Fever >/= 101). Patient not taking: Reported on 03/24/2018 06/20/17   Welford Roche, MD   Past Medical History:  Diagnosis Date  . A-fib ([place])   . Cancer ([place])   . Diabetes mellitus without complication ([place])   . Dysrhythmia   . GI bleed 04/02/2014  . Hypertension   . Seizures ([place])    Social History   Socioeconomic History  . Marital status: Married    Spouse name: Not on file  . Number of children: Not on file  . Years of education: Not on file  . Highest education level: Not on file  Occupational History  . Not on file  Social Needs  . Financial resource strain: Not on file  . Food insecurity:    Worry: Not on file    Inability: Not on file  . Transportation needs:    Medical: Not  on file    Non-medical: Not on file  Tobacco Use  . Smoking status: Former Smoker    Last attempt to quit: 09/24/1969    Years since quitting: 48.5  . Smokeless tobacco: Never Used  Substance and Sexual Activity  . Alcohol use: No  . Drug use: No  . Sexual activity: Not on file  Lifestyle  . Physical activity:    Days per week: Not on file    Minutes per session: Not on file  . Stress: Not on file  Relationships  . Social connections:    Talks on phone: Not on file    Gets together: Not on file    Attends religious service: Not on file    Active member of club or organization: Not on file    Attends meetings of clubs or organizations: Not on file    Relationship  status: Not on file  Other Topics Concern  . Not on file  Social History Narrative  . Not on file   Family History  Problem Relation Age of Onset  . Diabetes Mellitus II Mother   . Prostate cancer Father   . Diabetes Mellitus II Brother     ASSESSMENT Recent Results: The most recent result is correlated with 30 mg per week: Lab Results  Component Value Date   INR 2.1 03/24/2018   INR 2.3 03/03/2018   INR 2.30 02/03/2018    Anticoagulation Dosing: Description   Take 1 tablet of your teal 6mg  warfarin tablets by mouth once daily on Mondays,Tuesdays, Wednesdays, Fridays and Saturdays; on all other days, take 1/2 tablet by mouth once daily.     INR today: Therapeutic  PLAN Weekly dose was increased by 20% to 36 mg per week  Patient Instructions  Patient instructed to take medications as defined in the Anti-coagulation Track section of this encounter.  Patient instructed to take today's dose.  Patient instructed to take  1 tablet of your teal 6mg  warfarin tablets by mouth once daily on Mondays,Tuesdays, Wednesdays, Fridays and Saturdays; on all other days, take 1/2 tablet by mouth once daily. Patient verbalized understanding of these instructions.     Patient advised to contact clinic or seek medical attention if signs/symptoms of bleeding or thromboembolism occur.  Patient verbalized understanding by repeating back information and was advised to contact me if further medication-related questions arise. Patient was also provided an information handout.  Follow-up Return in 1 month (on 04/22/2018) for Follow up INR at 0900h for TUESDAY 30-JUL-19.  Pennie Banter, PharmD, CACP, CPP  15 minutes spent face-to-face with the patient during the encounter. 50% of time spent on education. 50% of time was spent on fingerstick point of care INR sample collection, processing, results determination, dose adjustment and documentation in [email or web address].

## 2018-03-25 ENCOUNTER — Other Ambulatory Visit: Payer: Self-pay | Admitting: Internal Medicine

## 2018-03-25 DIAGNOSIS — G5693 Unspecified mononeuropathy of bilateral upper limbs: Secondary | ICD-10-CM

## 2018-03-25 NOTE — Telephone Encounter (Signed)
Called pt - no answer; left message to call the clinic. 

## 2018-03-25 NOTE — Telephone Encounter (Signed)
Last note indicated taking 600 QHS and could take 300 in AM. Do you know if he is taking 300 in AM? Thanks

## 2018-03-26 NOTE — Telephone Encounter (Signed)
Pt states he takes 2 tabs at night ; non in the morning.

## 2018-04-21 ENCOUNTER — Other Ambulatory Visit: Payer: Self-pay | Admitting: Internal Medicine

## 2018-04-21 DIAGNOSIS — E1142 Type 2 diabetes mellitus with diabetic polyneuropathy: Secondary | ICD-10-CM

## 2018-04-22 ENCOUNTER — Ambulatory Visit (INDEPENDENT_AMBULATORY_CARE_PROVIDER_SITE_OTHER): Payer: Medicare Other | Admitting: Pharmacist

## 2018-04-22 DIAGNOSIS — I4891 Unspecified atrial fibrillation: Secondary | ICD-10-CM | POA: Diagnosis not present

## 2018-04-22 DIAGNOSIS — Z5181 Encounter for therapeutic drug level monitoring: Secondary | ICD-10-CM

## 2018-04-22 DIAGNOSIS — Z7901 Long term (current) use of anticoagulants: Secondary | ICD-10-CM

## 2018-04-22 LAB — POCT INR: INR: 2.7 (ref 2.0–3.0)

## 2018-04-22 NOTE — Progress Notes (Signed)
Anticoagulation Management Andrew [name] is a 84 y.o. male who reports to the clinic for monitoring of warfarin treatment.    Indication: atrial fibrillation; long term current use of anticoagulant.   Duration: indefinite Supervising physician: Gilles Chiquito  Anticoagulation Clinic Visit History: Patient does not report signs/symptoms of bleeding or thromboembolism  Other recent changes: No diet, medications, lifestyle changes endorsed by the patient or his daughter at this visit.  Anticoagulation Episode Summary    Current INR goal:   2.0-3.0  TTR:   90.6 % (9.3 mo)  Next INR check:   05/19/2018  INR from last check:   2.7 (04/22/2018)  Weekly max warfarin dose:     Target end date:     INR check location:     Preferred lab:     Send INR reminders to:   ANT[facility]AG [facility]   Indications   Atrial fibrillation (Andrew [name]) [I48.91]       Comments:           Allergies  Allergen Reactions  . Tegretol [Carbamazepine] Other (See Comments)    Made patient's feet swell  . Depakote [Divalproex Sodium] Rash  . Dilantin [Phenytoin Sodium Extended] Rash  . Phenobarbital Rash   Prior to Admission medications   Medication Sig Start Date End Date Taking? Authorizing Provider  acetaminophen (TYLENOL) 325 MG tablet Take 2 tablets (650 mg total) by mouth every 6 (six) hours as needed for mild pain (or Fever >/= 101). 06/20/17  Yes Welford Roche, MD  Cholecalciferol (VITAMIN D-3) 1000 units CAPS Take 1,000 Units by mouth daily.   Yes [provider]  gabapentin (NEURONTIN) 300 MG capsule TAKE 2 CAPSULES (600 MG TOTAL) BY MOUTH AT BEDTIME. 03/26/18  Yes Bartholomew Crews, MD  hydrocortisone cream 1 % Apply 1 application topically every 4 (four) hours.   Yes [provider]  metFORMIN (GLUCOPHAGE) 500 MG tablet Take 1 tablet (500 mg total) by mouth as directed. Patient taking differently: Take 500 mg by mouth at bedtime.  04/06/14  Yes Sid Falcon, MD  metoprolol tartrate  (LOPRESSOR) 25 MG tablet Take 12.5 mg by mouth at bedtime.    Yes [provider]  Multiple Vitamins-Minerals (ONE-A-DAY MENS 50+ ADVANTAGE) TABS Take 1 tablet by mouth at bedtime.   Yes [provider]  vitamin B-12 (CYANOCOBALAMIN) 1000 MCG tablet Take 1,000 mcg by mouth daily.   Yes [provider]  warfarin (COUMADIN) 6 MG tablet Take 6 mg one tablet Monday,Tuesday, Wednesday, Friday and Saturday. Take 3 mg one half tablet all other days. 03/24/18  Yes Lucious Groves, DO  zonisamide (ZONEGRAN) 100 MG capsule Take 300 mg by mouth 2 (two) times daily. MORNING and EVENING   Yes [provider]   Past Medical History:  Diagnosis Date  . A-fib ([place])   . Cancer ([place])   . Diabetes mellitus without complication ([place])   . Dysrhythmia   . GI bleed 04/02/2014  . Hypertension   . Seizures ([place])    Social History   Socioeconomic History  . Marital status: Married    Spouse name: Not on file  . Number of children: Not on file  . Years of education: Not on file  . Highest education level: Not on file  Occupational History  . Not on file  Social Needs  . Financial resource strain: Not on file  . Food insecurity:    Worry: Not on file    Inability: Not on file  . Transportation  needs:    Medical: Not on file    Non-medical: Not on file  Tobacco Use  . Smoking status: Former Smoker    Last attempt to quit: 09/24/1969    Years since quitting: 48.6  . Smokeless tobacco: Never Used  Substance and Sexual Activity  . Alcohol use: No  . Drug use: No  . Sexual activity: Not on file  Lifestyle  . Physical activity:    Days per week: Not on file    Minutes per session: Not on file  . Stress: Not on file  Relationships  . Social connections:    Talks on phone: Not on file    Gets together: Not on file    Attends religious service: Not on file    Active member of club or organization: Not on file    Attends meetings of clubs or organizations: Not on file     Relationship status: Not on file  Other Topics Concern  . Not on file  Social History Narrative  . Not on file   Family History  Problem Relation Age of Onset  . Diabetes Mellitus II Mother   . Prostate cancer Father   . Diabetes Mellitus II Brother     ASSESSMENT Recent Results: The most recent result is correlated with 36 mg per week: Lab Results  Component Value Date   INR 2.7 04/22/2018   INR 2.1 03/24/2018   INR 2.3 03/03/2018    Anticoagulation Dosing: Description   Take 1 tablet of your teal 6mg  warfarin tablets by mouth once daily on Mondays, Wednesdays, Fridays and Saturdays; on all other days, take 1/2 tablet by mouth once daily.     INR today: Therapeutic  PLAN Weekly dose was decreased by 8% to 33 mg per week  Patient Instructions  Patient instructed to take medications as defined in the Anti-coagulation Track section of this encounter.  Patient instructed to take today's dose.  Patient instructed to take 1 tablet of your teal 6mg  warfarin tablets by mouth once daily on Mondays, Wednesdays, Fridays and Saturdays; on all other days, take 1/2 tablet by mouth once daily. Patient verbalized understanding of these instructions.     Patient advised to contact clinic or seek medical attention if signs/symptoms of bleeding or thromboembolism occur.  Patient verbalized understanding by repeating back information and was advised to contact me if further medication-related questions arise. Patient was also provided an information handout.  Follow-up Return in 4 weeks (on 05/19/2018) for Follow up INR at 0915h.  Pennie Banter, PharmD, CACP, CPP  15 minutes spent face-to-face with the patient during the encounter. 50% of time spent on education. 50% of time was spent on fingerstick point of care INR sample collection, processing, results determination, dose adjustment and documentation in [email or web address].

## 2018-04-22 NOTE — Patient Instructions (Signed)
Patient instructed to take medications as defined in the Anti-coagulation Track section of this encounter.  Patient instructed to take today's dose.  Patient instructed to take 1 tablet of your teal 6mg  warfarin tablets by mouth once daily on Mondays, Wednesdays, Fridays and Saturdays; on all other days, take 1/2 tablet by mouth once daily. Patient verbalized understanding of these instructions.

## 2018-04-27 NOTE — Progress Notes (Signed)
I reviewed Dr. Gladstone Pih note. Patient is on [facility] for AFIB.  INR at high end of goal and coumadin decreased.

## 2018-05-15 ENCOUNTER — Ambulatory Visit: Payer: Medicare Other | Admitting: Pharmacist

## 2018-05-15 ENCOUNTER — Other Ambulatory Visit: Payer: Self-pay | Admitting: Student-PharmD

## 2018-05-15 ENCOUNTER — Other Ambulatory Visit: Payer: Self-pay

## 2018-05-15 ENCOUNTER — Encounter: Payer: Self-pay | Admitting: Internal Medicine

## 2018-05-15 ENCOUNTER — Ambulatory Visit (INDEPENDENT_AMBULATORY_CARE_PROVIDER_SITE_OTHER): Payer: Medicare Other | Admitting: Internal Medicine

## 2018-05-15 VITALS — BP 120/67 | HR 58 | Temp 98.2°F | Wt 186.9 lb

## 2018-05-15 DIAGNOSIS — G40909 Epilepsy, unspecified, not intractable, without status epilepticus: Secondary | ICD-10-CM

## 2018-05-15 DIAGNOSIS — E1142 Type 2 diabetes mellitus with diabetic polyneuropathy: Secondary | ICD-10-CM

## 2018-05-15 DIAGNOSIS — Z79899 Other long term (current) drug therapy: Secondary | ICD-10-CM

## 2018-05-15 DIAGNOSIS — N183 Chronic kidney disease, stage 3 unspecified: Secondary | ICD-10-CM

## 2018-05-15 DIAGNOSIS — B353 Tinea pedis: Secondary | ICD-10-CM

## 2018-05-15 DIAGNOSIS — L723 Sebaceous cyst: Secondary | ICD-10-CM

## 2018-05-15 DIAGNOSIS — R569 Unspecified convulsions: Secondary | ICD-10-CM

## 2018-05-15 DIAGNOSIS — B351 Tinea unguium: Secondary | ICD-10-CM

## 2018-05-15 DIAGNOSIS — I4891 Unspecified atrial fibrillation: Secondary | ICD-10-CM

## 2018-05-15 DIAGNOSIS — D1722 Benign lipomatous neoplasm of skin and subcutaneous tissue of left arm: Secondary | ICD-10-CM | POA: Diagnosis not present

## 2018-05-15 DIAGNOSIS — I714 Abdominal aortic aneurysm, without rupture, unspecified: Secondary | ICD-10-CM

## 2018-05-15 DIAGNOSIS — G629 Polyneuropathy, unspecified: Secondary | ICD-10-CM

## 2018-05-15 DIAGNOSIS — Z23 Encounter for immunization: Secondary | ICD-10-CM

## 2018-05-15 DIAGNOSIS — E1122 Type 2 diabetes mellitus with diabetic chronic kidney disease: Secondary | ICD-10-CM | POA: Diagnosis not present

## 2018-05-15 DIAGNOSIS — G5693 Unspecified mononeuropathy of bilateral upper limbs: Secondary | ICD-10-CM

## 2018-05-15 DIAGNOSIS — Z7901 Long term (current) use of anticoagulants: Secondary | ICD-10-CM

## 2018-05-15 DIAGNOSIS — N400 Enlarged prostate without lower urinary tract symptoms: Secondary | ICD-10-CM

## 2018-05-15 DIAGNOSIS — R58 Hemorrhage, not elsewhere classified: Secondary | ICD-10-CM

## 2018-05-15 HISTORY — DX: Abdominal aortic aneurysm, without rupture: I71.4

## 2018-05-15 HISTORY — DX: Abdominal aortic aneurysm, without rupture, unspecified: I71.40

## 2018-05-15 HISTORY — DX: Sebaceous cyst: L72.3

## 2018-05-15 HISTORY — DX: Benign prostatic hyperplasia without lower urinary tract symptoms: N40.0

## 2018-05-15 LAB — POCT GLYCOSYLATED HEMOGLOBIN (HGB A1C): HEMOGLOBIN A1C: 5.4 % (ref 4.0–5.6)

## 2018-05-15 LAB — GLUCOSE, CAPILLARY: Glucose-Capillary: 124 mg/dL — ABNORMAL HIGH (ref 70–99)

## 2018-05-15 LAB — POCT INR
INR: 2.5 (ref 2.0–3.0)
INR: 2.5 (ref 2.0–3.0)

## 2018-05-15 MED ORDER — WARFARIN SODIUM 6 MG PO TABS: ORAL_TABLET | ORAL | 5 refills | Status: DC

## 2018-05-15 MED ORDER — GABAPENTIN 300 MG PO CAPS: 300.0000 mg | ORAL_CAPSULE | Freq: Three times a day (TID) | ORAL | 2 refills | Status: DC

## 2018-05-15 NOTE — Assessment & Plan Note (Signed)
This problem is chronic and uncontrolled.  He thinks that the symptoms started in his toes and progressed to mid calf.  He also has symptoms in his hands and cannot remember but thinks they started in his arms and went down to his fingers.  He states he has a numb sensation and that is the primary sensation but only has occasional burning or tingling.  His work-up has included an A1c which shows well-controlled diabetes (I do not have old A1c's to ensure that he actually truly had a diagnosis of diabetes but diabetes is on his medical record) & a nl B12.  He does take oral B12 supplementation.  He had a nerve conduction study requested at his last appointment but it was ordered incorrectly and it is not yet been scheduled.  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  I have placed the proper order.  In the meantime, he states the gabapentin does help but it does not last long enough.  He was on 600 nightly and has been tapering down and is now on one at night.  He states even the 300 mg helps.  Therefore, I am going to put him on 300 mg 3 times daily.  This can be titrated up needed.  PLAN : Gaba 300 TID NCT

## 2018-05-15 NOTE — Progress Notes (Signed)
   Subjective:    Patient ID: Andrew [name], male    DOB: [birth year], 84 y.o.   MRN: [id number]  HPI  Andrew [name] is here for neuropathy. Please see the A&P for the status of the pt's chronic medical problems.  ROS : per ROS section and in problem oriented charting. All other systems are negative.  PMHx, Soc hx, and / or Fam hx : His wife is currently in the hospital.  He has 2 daughters, the younger daughter comes with her parents to their appointments.  The other daughter allegedly has bipolar.  Review of Systems  Genitourinary: Negative for decreased urine volume, difficulty urinating, frequency and urgency.  Neurological: Positive for numbness.       Objective:   Physical Exam  Constitutional: He appears well-developed and well-nourished. No distress.  HENT:  Head: Normocephalic and atraumatic.  Right Ear: External ear normal.  Left Ear: External ear normal.  Nose: Nose normal.  Eyes: Conjunctivae and EOM are normal. Right eye exhibits no discharge. Left eye exhibits no discharge. No scleral icterus.  Cardiovascular: Normal rate and normal heart sounds.  irr irr  Pulmonary/Chest: Effort normal and breath sounds normal. He has no wheezes. He has no rales.  Musculoskeletal:  There is a 3 cm x 3 cm mass on the left inferior deltoid area which is nontender.  There is a little bit of fluctuance over the mass.  No erythema no warmth.  Neurological: He is alert.  Skin: Skin is warm and dry. No rash noted. He is not diaphoretic. No erythema.  Several ecchymosis Hypertrophic toenails with onychomycosis Tinea pedis  Psychiatric: He has a normal mood and affect. His behavior is normal. Judgment and thought content normal.       Assessment & Plan:

## 2018-05-15 NOTE — Assessment & Plan Note (Signed)
This problem is chronic and controlled.. This is managed by the [place].  He is on warfarin and that is managed in our warfarin clinic.  He was unable to confirm that he takes metoprolol but he recognized the dose of 12.5 mg at bedtime.  He is rate controlled today.  PLAN:  Cont current meds

## 2018-05-15 NOTE — Patient Instructions (Signed)
Patient educated about medication as defined in this encounter and verbalized understanding by repeating back instructions provided.   

## 2018-05-15 NOTE — Progress Notes (Signed)
INTERNAL MEDICINE TEACHING ATTENDING ADDENDUM  I agree with pharmacy recommendations as outlined in their note.   Alexander N Raines, MD  

## 2018-05-15 NOTE — Assessment & Plan Note (Signed)
This is a new problem.  He has a mass in the left upper arm for many years but feels it is grown a little bit in the recent past.  There is a about a 3 cm x 3 cm fluctuant/rubbery mass just distal to the deltoid on the left upper extremity.  It is nonpainful.  There is no warmth or erythema.  I think this is most likely a lipoma and offered him 2 options.  First is to see a surgeon to get it excised and undergo pathology.  The other option, which he accepted, was to get an ultrasound in [facility] in September.  PLAN : U/S in Sep

## 2018-05-15 NOTE — Patient Instructions (Signed)
1. I will schedule your nerve conduction study

## 2018-05-15 NOTE — Assessment & Plan Note (Signed)
This problem is chronic and stable.  We do not have a lot of BMPs to assess this but it does seem to be stable at about 1.4.  His diabetes is well controlled as is his blood pressure.  I did not ask about nonsteroidals but I can do that at his next appointment.  PLAN : follow

## 2018-05-15 NOTE — Progress Notes (Signed)
Anticoagulation Management Andrew [name] is a 84 y.o. male who reports to the clinic for monitoring of warfarin treatment.    Indication: atrial fibrillation, long term current use of anticoagulant. Duration: indefinite Supervising physician: Joni Reining  Anticoagulation Clinic Visit History: Patient does not report signs/symptoms of bleeding or thromboembolism  Other recent changes: No diet, medications, lifestyle changes endorsed  Anticoagulation Episode Summary    Current INR goal:   2.0-3.0  TTR:   91.3 % (10 mo)  Next INR check:   06/12/2018  INR from last check:   2.5 (05/15/2018)  Weekly max warfarin dose:     Target end date:     INR check location:     Preferred lab:     Send INR reminders to:   [facility]   Indications   Atrial fibrillation ([place]) [I48.91]       Comments:          Allergies  Allergen Reactions  . Tegretol [Carbamazepine] Other (See Comments)    Made patient's feet swell  . Depakote [Divalproex Sodium] Rash  . Dilantin [Phenytoin Sodium Extended] Rash  . Phenobarbital Rash   Medication Sig  acetaminophen (TYLENOL) 325 MG tablet Take 2 tablets (650 mg total) by mouth every 6 (six) hours as needed for mild pain (or Fever >/= 101).  Cholecalciferol (VITAMIN D-3) 1000 units CAPS Take 1,000 Units by mouth daily.  gabapentin (NEURONTIN) 300 MG capsule TAKE 2 CAPSULES (600 MG TOTAL) BY MOUTH AT BEDTIME.  hydrocortisone cream 1 % Apply 1 application topically every 4 (four) hours.  metFORMIN (GLUCOPHAGE) 500 MG tablet Take 1 tablet (500 mg total) by mouth as directed. Patient taking differently: Take 500 mg by mouth at bedtime.   metoprolol tartrate (LOPRESSOR) 25 MG tablet Take 12.5 mg by mouth at bedtime.   Multiple Vitamins-Minerals (ONE-A-DAY MENS 50+ ADVANTAGE) TABS Take 1 tablet by mouth at bedtime.  vitamin B-12 (CYANOCOBALAMIN) 1000 MCG tablet Take 1,000 mcg by mouth daily.  warfarin (COUMADIN) 6 MG tablet Take 6 mg one tablet Monday,Tuesday,  Wednesday, Friday and Saturday. Take 3 mg one half tablet all other days.  zonisamide (ZONEGRAN) 100 MG capsule Take 300 mg by mouth 2 (two) times daily. MORNING and EVENING   Past Medical History:  Diagnosis Date  . A-fib ([place])   . Cancer ([place])   . Diabetes mellitus without complication ([place])   . Dysrhythmia   . GI bleed 04/02/2014  . Hypertension   . Seizures ([place])    Social History   Socioeconomic History  . Marital status: Married    Spouse name: Not on file  . Number of children: Not on file  . Years of education: Not on file  . Highest education level: Not on file  Occupational History  . Not on file  Social Needs  . Financial resource strain: Not on file  . Food insecurity:    Worry: Not on file    Inability: Not on file  . Transportation needs:    Medical: Not on file    Non-medical: Not on file  Tobacco Use  . Smoking status: Former Smoker    Last attempt to quit: 09/24/1969    Years since quitting: 48.6  . Smokeless tobacco: Never Used  Substance and Sexual Activity  . Alcohol use: No  . Drug use: No  . Sexual activity: Not on file  Lifestyle  . Physical activity:    Days per week: Not on file    Minutes per session:  Not on file  . Stress: Not on file  Relationships  . Social connections:    Talks on phone: Not on file    Gets together: Not on file    Attends religious service: Not on file    Active member of club or organization: Not on file    Attends meetings of clubs or organizations: Not on file    Relationship status: Not on file  Other Topics Concern  . Not on file  Social History Narrative  . Not on file   Family History  Problem Relation Age of Onset  . Diabetes Mellitus II Mother   . Prostate cancer Father   . Diabetes Mellitus II Brother     ASSESSMENT Recent Results Lab Results  Component Value Date   INR 2.5 05/15/2018   INR 2.7 04/22/2018   INR 2.1 03/24/2018   Anticoagulation Dosing: Description   Take 1 tablet of your  teal 6mg  warfarin tablets by mouth once daily on Mondays, Wednesdays, Fridays and Saturdays; on all other days, take 1/2 tablet by mouth once daily.     INR today: Therapeutic  PLAN Weekly dose was unchanged  There are no Patient Instructions on file for this visit. Patient advised to contact clinic or seek medical attention if signs/symptoms of bleeding or thromboembolism occur.  Patient verbalized understanding by repeating back information and was advised to contact me if further medication-related questions arise. Patient was also provided an information handout.  Follow-up Return in about 4 weeks (around 06/12/2018).  [name]

## 2018-05-15 NOTE — Assessment & Plan Note (Signed)
This is an incidental finding on CT in 2012.  The recommendations are rescreening in 10 years.  This would mean he would need another screening in 2022.  He was not aware of this diagnosis and did not want a copy of the report to take to the [place].
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
## 2018-05-15 NOTE — Assessment & Plan Note (Signed)
This is an incidental finding on his CT in 2012.  He denies any symptoms of BPH.  I will continue to follow and see if he develops any symptoms.

## 2018-05-15 NOTE — Assessment & Plan Note (Signed)
This problem is chronic and well controlled.  It is primarily managed by the [place].  Patient states his metformin was stopped which makes sense as his last A1c in May was 5.6 and today his A1c was 5.4.  He states all of his diabetes care is at the [place] but agrees to microalbumin today.  He states he is waiting on the [place] to refer him to an ophthalmologist.  PLAN : microalbumin level

## 2018-05-16 LAB — MICROALBUMIN / CREATININE URINE RATIO
Creatinine, Urine: 121.9 mg/dL
MICROALB/CREAT RATIO: 60.5 mg/g{creat} — AB (ref 0.0–30.0)
Microalbumin, Urine: 73.8 ug/mL

## 2018-05-19 ENCOUNTER — Telehealth: Payer: Self-pay | Admitting: Internal Medicine

## 2018-05-19 DIAGNOSIS — G5693 Unspecified mononeuropathy of bilateral upper limbs: Secondary | ICD-10-CM

## 2018-05-19 MED ORDER — GABAPENTIN 100 MG PO CAPS: 100.0000 mg | ORAL_CAPSULE | Freq: Three times a day (TID) | ORAL | 5 refills | Status: DC

## 2018-05-19 NOTE — Telephone Encounter (Signed)
Pt pickup gabapentin (NEURONTIN) 300 MG capsule the pt is saying that his prescription suppose to be 100mg , pls call pt [phone]

## 2018-05-19 NOTE — Telephone Encounter (Signed)
He had 300 mg pills. He did state he was taking only 100 mg but how do you divide a pill into 3? Anyway, I sent in Rx 100 mg one pill TID.

## 2018-06-12 ENCOUNTER — Ambulatory Visit: Payer: Medicare Other | Admitting: Pharmacist

## 2018-06-13 ENCOUNTER — Ambulatory Visit (INDEPENDENT_AMBULATORY_CARE_PROVIDER_SITE_OTHER): Payer: Medicare Other | Admitting: Pharmacist

## 2018-06-13 DIAGNOSIS — I4891 Unspecified atrial fibrillation: Secondary | ICD-10-CM

## 2018-06-13 DIAGNOSIS — Z5181 Encounter for therapeutic drug level monitoring: Secondary | ICD-10-CM | POA: Diagnosis not present

## 2018-06-13 DIAGNOSIS — Z7901 Long term (current) use of anticoagulants: Secondary | ICD-10-CM | POA: Diagnosis not present

## 2018-06-13 LAB — POCT INR: INR: 3.8 — AB (ref 2.0–3.0)

## 2018-06-13 NOTE — Patient Instructions (Signed)
Patient educated about medication as defined in this encounter and verbalized understanding by repeating back instructions provided.   

## 2018-06-13 NOTE — Progress Notes (Signed)
Anticoagulation Management Andrew [name] a 84 y.o.malewho reports to the clinic for monitoring of warfarintreatment.   Indication:atrial fibrillation, long term current use of anticoagulant. Duration:indefinite Supervising physician:Erik Hoffman  Anticoagulation Clinic Visit History: Patientdoes notreport signs/symptoms of bleeding or thromboembolism  Other recent changes:Nodiet, medications, lifestylechanges endorsed   Anticoagulation Episode Summary    Current INR goal:   2.0-3.0  TTR:   86.6 % (11 mo)  Next INR check:   07/17/2018  INR from last check:   3.8! (06/13/2018)  Weekly max warfarin dose:     Target end date:     INR check location:     Preferred lab:     Send INR reminders to:   [facility]   Indications   Atrial fibrillation ([name]) [I48.91]       Comments:          Allergies  Allergen Reactions  . Tegretol [Carbamazepine] Other (See Comments)    Made patient's feet swell  . Depakote [Divalproex Sodium] Rash  . Dilantin [Phenytoin Sodium Extended] Rash  . Phenobarbital Rash   Medication Sig  acetaminophen (TYLENOL) 325 MG tablet Take 2 tablets (650 mg total) by mouth every 6 (six) hours as needed for mild pain (or Fever >/= 101).  Cholecalciferol (VITAMIN D-3) 1000 units CAPS Take 1,000 Units by mouth daily.  gabapentin (NEURONTIN) 100 MG capsule Take 1 capsule (100 mg total) by mouth 3 (three) times daily.  hydrocortisone cream 1 % Apply 1 application topically every 4 (four) hours.  metFORMIN (GLUCOPHAGE) 500 MG tablet Take 1 tablet (500 mg total) by mouth as directed. Patient taking differently: Take 500 mg by mouth at bedtime.   metoprolol tartrate (LOPRESSOR) 25 MG tablet Take 12.5 mg by mouth at bedtime.   Multiple Vitamins-Minerals (ONE-A-DAY MENS 50+ ADVANTAGE) TABS Take 1 tablet by mouth at bedtime.  vitamin B-12 (CYANOCOBALAMIN) 1000 MCG tablet Take 1,000 mcg by mouth daily.  warfarin (COUMADIN) 6 MG tablet Take 1 tablet of your  teal 6mg  warfarin tablets by mouth once daily on Mondays, Wednesdays, Fridays and Saturdays; on all other days, take 1/2 tablet by mouth once daily.  zonisamide (ZONEGRAN) 100 MG capsule Take 300 mg by mouth 2 (two) times daily. MORNING and EVENING   Past Medical History:  Diagnosis Date  . A-fib ([place])   . Cancer ([place])   . Diabetes mellitus without complication ([place])   . Dysrhythmia   . GI bleed 04/02/2014  . Hypertension   . Seizures ([place])    Social History   Socioeconomic History  . Marital status: Married    Spouse name: Not on file  . Number of children: Not on file  . Years of education: Not on file  . Highest education level: Not on file  Occupational History  . Not on file  Social Needs  . Financial resource strain: Not on file  . Food insecurity:    Worry: Not on file    Inability: Not on file  . Transportation needs:    Medical: Not on file    Non-medical: Not on file  Tobacco Use  . Smoking status: Former Smoker    Last attempt to quit: 09/24/1969    Years since quitting: 48.7  . Smokeless tobacco: Never Used  Substance and Sexual Activity  . Alcohol use: No  . Drug use: No  . Sexual activity: Not on file  Lifestyle  . Physical activity:    Days per week: Not on file    Minutes per  session: Not on file  . Stress: Not on file  Relationships  . Social connections:    Talks on phone: Not on file    Gets together: Not on file    Attends religious service: Not on file    Active member of club or organization: Not on file    Attends meetings of clubs or organizations: Not on file    Relationship status: Not on file  Other Topics Concern  . Not on file  Social History Narrative  . Not on file   Family History  Problem Relation Age of Onset  . Diabetes Mellitus II Mother   . Prostate cancer Father   . Diabetes Mellitus II Brother    ASSESSMENT Recent Results: Lab Results  Component Value Date   INR 3.8 (A) 06/13/2018   INR 2.5 05/15/2018   INR 2.5  05/15/2018   Anticoagulation Dosing: Description   Take 1 tablet of your teal 6mg  warfarin tablets by mouth once daily on Mondays, Wednesdays, Fridays and Saturdays; on all other days, take 1/2 tablet by mouth once daily.     INR today: Supratherapeutic  PLAN Weekly dose was decreased by 9% to 30 mg per week  Patient Instructions  Patient educated about medication as defined in this encounter and verbalized understanding by repeating back instructions provided.   Patient advised to contact clinic or seek medical attention if signs/symptoms of bleeding or thromboembolism occur.  Patient verbalized understanding by repeating back information and was advised to contact me if further medication-related questions arise. Patient was also provided an information handout.  Follow-up Return in about 5 weeks (around 07/17/2018).  [name]

## 2018-06-16 MED ORDER — WARFARIN SODIUM 6 MG PO TABS: ORAL_TABLET | ORAL | 5 refills | Status: DC

## 2018-06-16 NOTE — Addendum Note (Signed)
Addended by: Forde Dandy on: 06/16/2018 10:43 AM   Modules accepted: Orders

## 2018-06-16 NOTE — Progress Notes (Signed)
INTERNAL MEDICINE TEACHING ATTENDING ADDENDUM - Lucious Groves, DO Duration- indefinate, Indication- afib, INR-  Lab Results  Component Value Date   INR 3.8 (A) 06/13/2018  . Agree with pharmacy recommendations as outlined in their note.

## 2018-06-19 ENCOUNTER — Other Ambulatory Visit: Payer: Self-pay | Admitting: Internal Medicine

## 2018-06-19 ENCOUNTER — Ambulatory Visit: Payer: Medicare Other

## 2018-06-19 DIAGNOSIS — G5693 Unspecified mononeuropathy of bilateral upper limbs: Secondary | ICD-10-CM

## 2018-06-19 NOTE — Telephone Encounter (Signed)
Requesting 3 month supply. Thanks

## 2018-06-20 ENCOUNTER — Ambulatory Visit (INDEPENDENT_AMBULATORY_CARE_PROVIDER_SITE_OTHER): Payer: Medicare Other | Admitting: Neurology

## 2018-06-20 ENCOUNTER — Ambulatory Visit: Payer: Medicare Other | Admitting: Neurology

## 2018-06-20 DIAGNOSIS — E1142 Type 2 diabetes mellitus with diabetic polyneuropathy: Secondary | ICD-10-CM | POA: Diagnosis not present

## 2018-06-20 DIAGNOSIS — G6289 Other specified polyneuropathies: Secondary | ICD-10-CM

## 2018-06-20 DIAGNOSIS — R269 Unspecified abnormalities of gait and mobility: Secondary | ICD-10-CM | POA: Insufficient documentation

## 2018-06-20 DIAGNOSIS — G5693 Unspecified mononeuropathy of bilateral upper limbs: Secondary | ICD-10-CM

## 2018-06-20 DIAGNOSIS — Z0289 Encounter for other administrative examinations: Secondary | ICD-10-CM

## 2018-06-20 DIAGNOSIS — G3281 Cerebellar ataxia in diseases classified elsewhere: Secondary | ICD-10-CM

## 2018-06-20 HISTORY — DX: Unspecified abnormalities of gait and mobility: R26.9

## 2018-06-20 NOTE — Procedures (Signed)
Full Name: Andrew [name] Gender: Male MRN #: [id number] Date of Birth: 22-Dec-1933    Visit Date: 06/20/2018 10:00 Age: 84 Years 53 Months Old Examining Physician: Marcial Pacas, MD  Referring Physician: Kathi Ludwig, MD History: 84 years old male with history of gradual onset bilateral feet paresthesia, gait abnormality, urinary incontinence. He also complains of bilateral hands paresthesia.  On examination: Bilateral upper extremity proximal and distal strength is normal.  Mild bilateral hip flexion, mild to moderate bilateral ankle dorsiflexion weakness.  Less dependent sensory changes, absent reflexes at bilateral lower extremities, wide-based unsteady bilateral flailed gait  Summary of the tests:  Nerve conduction study: Bilateral sural, superficial peroneal sensory responses were absent.  Right ulnar and radial sensory responses showed moderately decreased snap amplitude.  Right median sensory response was absent.  Left median sensory response showed significantly prolonged peak latency, with severely decreased to snap amplitude.  Bilateral tibial motor responses showed severely decreased the C map amplitude, mild slow conduction velocity.  Bilateral EDB motor responses were absent.  Right ulnar motor response was normal.  Bilateral median motor responses showed significantly prolonged distal latency, with significantly decreased the C map amplitude, mild slow conduction velocity.  Electromyography: Selective needle examinations was performed at right upper, lower extremity muscles, right cervical, lumbosacral paraspinal muscles.  There is evidence of chronic neuropathic changes involving right L4-5 myotomes, there also evidence of increased insertional activity of the right lumbosacral paraspinal muscles.  There is evidence of chronic neuropathic changes involving right cervical myotomes, right C7-T1.   Conclusion: This is an abnormal study.  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  In addition there is evidence of right lumbosacral radiculopathy mainly involving right L4-5 S1 myotomes.  There is also evidence of severe bilateral carpal tunnel syndromes.  Because of his progressive severe gait abnormality, urinary incontinence, I have ordered MRI of lumbar spine, we will see him at my clinic for further evaluation.  ------------------------------- Marcial Pacas, M.D. PhD  [facility] [place], [place] [address] Tel: [phone] Fax: [phone]        [facility]    Nerve / Sites Muscle Latency Ref. Amplitude Ref. Rel Amp Segments Distance Velocity Ref. Area    ms ms mV mV %  cm m/s m/s mVms  R Median - APB     Wrist APB 6.0 ?4.4 1.5 ?4.0 100 Wrist - APB 7   3.4     Upper arm APB 11.5  1.3  84.5 Upper arm - Wrist 23 42 ?49 3.1  L Median - APB     Wrist APB 6.4 ?4.4 1.2 ?4.0 100 Wrist - APB 7   2.4     Upper arm APB 11.3  1.0  81.3 Upper arm - Wrist 23 47 ?49 2.2  R Ulnar - ADM     Wrist ADM 3.2 ?3.3 6.1 ?6.0 100 Wrist - ADM 7   17.8     B.Elbow ADM 7.3  5.5  89.7 B.Elbow - Wrist 19 47 ?49 16.8     A.Elbow ADM 9.5  5.4  98.7 A.Elbow - B.Elbow 10 46 ?49 16.8         A.Elbow - Wrist      R Peroneal - EDB     Ankle EDB NR ?6.5 NR ?2.0 NR Ankle - EDB 9   NR     Fib head EDB NR  NR  NR Fib head -  Ankle 33 NR ?44 NR  L Peroneal - EDB     Ankle EDB NR ?6.5 NR ?2.0 NR Ankle - EDB 9   NR     Fib head EDB NR  NR  NR Fib head - Ankle 33 NR ?44 NR  R Tibial - AH     Ankle AH 5.4 ?5.8 0.6 ?4.0 100 Ankle - AH 9   1.7     Pop fossa AH 16.5  0.5  84.3 Pop fossa - Ankle 43 39 ?41 1.0  L Tibial - AH     Ankle AH 5.9 ?5.8 0.5 ?4.0 100 Ankle - AH 9   1.1     Pop fossa AH 17.3  0.2  33.1 Pop fossa - Ankle 43 38 ?41 0.4                   SNC    Nerve / Sites Rec. Site Peak Lat Ref.  Amp Ref. Segments Distance    ms ms V V  cm  R Radial - Anatomical snuff box (Forearm)      Forearm Wrist 2.7 ?2.9 3 ?15 Forearm - Wrist 10  R Sural - Ankle (Calf)     Calf Ankle NR ?4.4 NR ?6 Calf - Ankle 14  L Sural - Ankle (Calf)     Calf Ankle NR ?4.4 NR ?6 Calf - Ankle 14  R Superficial peroneal - Ankle     Lat leg Ankle NR ?4.4 NR ?6 Lat leg - Ankle 14  L Superficial peroneal - Ankle     Lat leg Ankle NR ?4.4 NR ?6 Lat leg - Ankle 14  R Median - Orthodromic (Dig II, Mid palm)     Dig II Wrist NR ?3.4 NR ?10 Dig II - Wrist 13  L Median - Orthodromic (Dig II, Mid palm)     Dig II Wrist 4.5 ?3.4 1 ?10 Dig II - Wrist 13  R Ulnar - Orthodromic, (Dig V, Mid palm)     Dig V Wrist 3.0 ?3.1 2 ?5 Dig V - Wrist 75                     F  Wave    Nerve F Lat Ref.   ms ms  R Tibial - AH 67.2 ?56.0  L Tibial - AH 68.2 ?56.0  R Ulnar - ADM 33.0 ?32.0           EMG full       EMG Summary Table    Spontaneous MUAP Recruitment  Muscle IA Fib PSW Fasc Other Amp Dur. Poly Pattern  R. Tibialis anterior Increased None None None _______ Normal Normal Normal Reduced  R. Tibialis posterior Normal None None None _______ Normal Normal Normal Reduced  R. Peroneus longus Normal None None None _______ Normal Increased Normal Reduced  R. Gastrocnemius (Medial head) Normal None None None _______ Increased Normal Normal Reduced  R. Vastus lateralis Normal None None None _______ Increased Normal Normal Reduced  R. Biceps femoris (long head) Normal None None None _______ Normal Increased Normal Reduced  R. Lumbar paraspinals (mid) Normal None None None _______ Increased Increased Normal Reduced  R. Lumbar paraspinals (low) Normal None None None _______ Increased Increased Normal Normal  R. First dorsal interosseous Normal None None None _______ Normal Normal Normal Reduced  R. Pronator teres Normal None None None _______ Normal Normal Normal Reduced  R. Biceps brachii Normal None None None _______ Normal Normal  Normal Normal  R. Deltoid Normal None None None _______ Normal Normal Normal Normal    R. Triceps brachii Normal None None None _______ Normal Normal Normal Normal  R. Cervical paraspinals Normal None None None _______ Normal Normal Normal Normal

## 2018-06-23 ENCOUNTER — Telehealth: Payer: Self-pay | Admitting: Neurology

## 2018-06-23 NOTE — Telephone Encounter (Signed)
[employer] medicare order sent to [facility]. No auth they will reach out to the pt to schedule.  °

## 2018-06-26 ENCOUNTER — Ambulatory Visit: Payer: Medicare Other | Admitting: Internal Medicine

## 2018-07-17 ENCOUNTER — Ambulatory Visit (INDEPENDENT_AMBULATORY_CARE_PROVIDER_SITE_OTHER): Payer: Medicare Other | Admitting: Internal Medicine

## 2018-07-17 ENCOUNTER — Encounter: Payer: Self-pay | Admitting: Internal Medicine

## 2018-07-17 ENCOUNTER — Ambulatory Visit (INDEPENDENT_AMBULATORY_CARE_PROVIDER_SITE_OTHER): Payer: Medicare Other | Admitting: Pharmacist

## 2018-07-17 ENCOUNTER — Other Ambulatory Visit: Payer: Self-pay

## 2018-07-17 DIAGNOSIS — Z8639 Personal history of other endocrine, nutritional and metabolic disease: Secondary | ICD-10-CM

## 2018-07-17 DIAGNOSIS — Z7901 Long term (current) use of anticoagulants: Secondary | ICD-10-CM

## 2018-07-17 DIAGNOSIS — D649 Anemia, unspecified: Secondary | ICD-10-CM

## 2018-07-17 DIAGNOSIS — Z5181 Encounter for therapeutic drug level monitoring: Secondary | ICD-10-CM | POA: Diagnosis not present

## 2018-07-17 DIAGNOSIS — Z85038 Personal history of other malignant neoplasm of large intestine: Secondary | ICD-10-CM

## 2018-07-17 DIAGNOSIS — R634 Abnormal weight loss: Secondary | ICD-10-CM

## 2018-07-17 DIAGNOSIS — I4891 Unspecified atrial fibrillation: Secondary | ICD-10-CM

## 2018-07-17 DIAGNOSIS — N401 Enlarged prostate with lower urinary tract symptoms: Secondary | ICD-10-CM

## 2018-07-17 DIAGNOSIS — R32 Unspecified urinary incontinence: Secondary | ICD-10-CM | POA: Diagnosis not present

## 2018-07-17 DIAGNOSIS — Z6826 Body mass index (BMI) 26.0-26.9, adult: Secondary | ICD-10-CM

## 2018-07-17 DIAGNOSIS — D1722 Benign lipomatous neoplasm of skin and subcutaneous tissue of left arm: Secondary | ICD-10-CM | POA: Diagnosis not present

## 2018-07-17 DIAGNOSIS — G629 Polyneuropathy, unspecified: Secondary | ICD-10-CM

## 2018-07-17 DIAGNOSIS — K469 Unspecified abdominal hernia without obstruction or gangrene: Secondary | ICD-10-CM

## 2018-07-17 DIAGNOSIS — R351 Nocturia: Secondary | ICD-10-CM

## 2018-07-17 DIAGNOSIS — N400 Enlarged prostate without lower urinary tract symptoms: Secondary | ICD-10-CM

## 2018-07-17 DIAGNOSIS — G5603 Carpal tunnel syndrome, bilateral upper limbs: Secondary | ICD-10-CM

## 2018-07-17 DIAGNOSIS — M5417 Radiculopathy, lumbosacral region: Secondary | ICD-10-CM

## 2018-07-17 DIAGNOSIS — Z79899 Other long term (current) drug therapy: Secondary | ICD-10-CM

## 2018-07-17 HISTORY — DX: Anemia, unspecified: D64.9

## 2018-07-17 LAB — POCT INR: INR: 2.6 (ref 2.0–3.0)

## 2018-07-17 NOTE — Patient Instructions (Signed)
1. Your INR today was at goal at 2.6 (goal 2-3) 2. Continue taking warfarin 3mg  daily except 6mg  on Tues, Thurs, and Sat 3. Follow up with Pharmacy Clinic in 5 weeks

## 2018-07-17 NOTE — Assessment & Plan Note (Signed)
I do not have pathology exercise in the records to know the details of his colon cancer.  It would be hard to know whether he needs continued surveillance.  He states he has not been following along for this problem.

## 2018-07-17 NOTE — Patient Instructions (Addendum)
1. Pls sch ACC appt for U/S left arm 2. Please let me know when you want to get the MRI of your back

## 2018-07-17 NOTE — Assessment & Plan Note (Signed)
This problem is newly noted today.  His hemoglobin has been stable in the 9's for the past 4 years.  His MCV is normal but his RDW is elevated at 15.6.  He is on B12 supplementation.  He gave me the history of colon cancer and he does have several chronic diseases so my first guess is anemia of chronic disease.  However, I will try to get his colon cancer records and further work-up.

## 2018-07-17 NOTE — Assessment & Plan Note (Signed)
This problem is chronic and stable.  This is an incidental finding on his CT scan.  At his last appointment, he denied any BPH symptoms.  I completed an I-PSS today and he scored one-point for urgency and 2 points for nocturia.  Nothing needs to be done other than follow his symptoms.  He does wear diapers due to urinary incontinence.

## 2018-07-17 NOTE — Progress Notes (Signed)
Anticoagulation Management Andrew [name] is a 84 y.o. male who reports to the clinic for monitoring of warfarin treatment.    Indication: atrial fibrillation , long term current use of anticoagulation  Duration: indefinite Supervising physician: [facility] Visit History: Patient does not report signs/symptoms of bleeding or thromboembolism  Other recent changes: Patient reports no diet, medications, or  lifestyle changes Anticoagulation Episode Summary    Current INR goal:   2.0-3.0  TTR:   81.7 % (1 y)  Next INR check:   08/26/2018  INR from last check:   2.6 (07/17/2018)  Weekly max warfarin dose:     Target end date:     INR check location:     Preferred lab:     Send INR reminders to:   ANT[facility]AG [facility]   Indications   Atrial fibrillation ([place]) [I48.91]       Comments:           Allergies  Allergen Reactions  . Tegretol [Carbamazepine] Other (See Comments)    Made patient's feet swell  . Depakote [Divalproex Sodium] Rash  . Dilantin [Phenytoin Sodium Extended] Rash  . Phenobarbital Rash   Prior to Admission medications   Medication Sig Start Date End Date Taking? Authorizing Provider  acetaminophen (TYLENOL) 325 MG tablet Take 2 tablets (650 mg total) by mouth every 6 (six) hours as needed for mild pain (or Fever >/= 101). 06/20/17   Welford Roche, MD  Cholecalciferol (VITAMIN D-3) 1000 units CAPS Take 1,000 Units by mouth daily.    [provider]  gabapentin (NEURONTIN) 100 MG capsule TAKE 1 CAPSULE BY MOUTH THREE TIMES A DAY 06/20/18   Bartholomew Crews, MD  hydrocortisone cream 1 % Apply 1 application topically every 4 (four) hours.    [provider]  metFORMIN (GLUCOPHAGE) 500 MG tablet Take 1 tablet (500 mg total) by mouth as directed. Patient taking differently: Take 500 mg by mouth at bedtime.  04/06/14   Sid Falcon, MD  metoprolol tartrate (LOPRESSOR) 25 MG tablet Take 12.5 mg by mouth at bedtime.      [provider]  Multiple Vitamins-Minerals (ONE-A-DAY MENS 50+ ADVANTAGE) TABS Take 1 tablet by mouth at bedtime.    [provider]  vitamin B-12 (CYANOCOBALAMIN) 1000 MCG tablet Take 1,000 mcg by mouth daily.    [provider]  warfarin (COUMADIN) 6 MG tablet Sun 3mg , Mon 3mg , Tue 6mg , Wed 3mg , Thur 6mg , Fri 3mg , Sat 6mg  06/16/18   Bartholomew Crews, MD  zonisamide (ZONEGRAN) 100 MG capsule Take 300 mg by mouth 2 (two) times daily. MORNING and EVENING    [provider]   Past Medical History:  Diagnosis Date  . A-fib ([place])   . Cancer ([place])   . Diabetes mellitus without complication ([place])   . Dysrhythmia   . GI bleed 04/02/2014  . Hypertension   . Seizures ([place])    Social History   Socioeconomic History  . Marital status: Married    Spouse name: Not on file  . Number of children: Not on file  . Years of education: Not on file  . Highest education level: Not on file  Occupational History  . Not on file  Social Needs  . Financial resource strain: Not on file  . Food insecurity:    Worry: Not on file    Inability: Not on file  . Transportation needs:    Medical: Not on file    Non-medical: Not  on file  Tobacco Use  . Smoking status: Former Smoker    Last attempt to quit: 09/24/1969    Years since quitting: 48.8  . Smokeless tobacco: Never Used  Substance and Sexual Activity  . Alcohol use: No  . Drug use: No  . Sexual activity: Not on file  Lifestyle  . Physical activity:    Days per week: Not on file    Minutes per session: Not on file  . Stress: Not on file  Relationships  . Social connections:    Talks on phone: Not on file    Gets together: Not on file    Attends religious service: Not on file    Active member of club or organization: Not on file    Attends meetings of clubs or organizations: Not on file    Relationship status: Not on file  Other Topics Concern  . Not on file  Social History Narrative  . Not on file    Family History  Problem Relation Age of Onset  . Diabetes Mellitus II Mother   . Prostate cancer Father   . Diabetes Mellitus II Brother     ASSESSMENT Recent Results: The most recent result is correlated with 30 mg per week: Lab Results  Component Value Date   INR 2.6 07/17/2018   INR 3.8 (A) 06/13/2018   INR 2.5 05/15/2018    Anticoagulation Dosing: Description   Take 1 tablet of your teal 6mg  warfarin tablets by mouth once daily on Mondays, Wednesdays, Fridays and Saturdays; on all other days, take 1/2 tablet by mouth once daily.     INR today: Therapeutic at 2.6  PLAN Weekly dose was unchanged. Will continue weekly dose of 30mg   Patient Instructions  1. Your INR today was at goal at 2.6 (goal 2-3) 2. Continue taking warfarin 3mg  daily except 6mg  on Tues, Thurs, and Sat 3. Follow up with Pharmacy Clinic in 5 weeks  Patient advised to contact clinic or seek medical attention if signs/symptoms of bleeding or thromboembolism occur.  Patient verbalized understanding by repeating back information and was advised to contact me if further medication-related questions arise. Patient was also provided an information handout.  Follow-up Return in about 5 weeks (around 08/21/2018).  Gwenlyn Found, Sherian Rein D PGY1 Pharmacy Resident  Phone [phone] 07/17/2018   1:18 PM  20 minutes spent face-to-face with the patient during the encounter. 50% of time spent on education. 50% of time was spent on collecting information, conducting and interpreting INR lab.

## 2018-07-17 NOTE — Assessment & Plan Note (Signed)
In our EHR, he is listed is taking metformin 500 nightly for diabetes.  I was concerned that this may have been on inaccurate diagnosis as his A1c this year has been 5.4 and 5.6.  I was going to discuss stopping his metformin but today, he informs me that he is not been on this as the [place] told him to stop it.  I do not have any records surrounding how and why and when he got diagnosed with diabetes and he cannot remember.  He can state that he was never hospitalized for hypoglycemia.  PLAN : Change diagnosis from diabetes to history of diabetes Remove metformin from his medication list.

## 2018-07-17 NOTE — Progress Notes (Signed)
   Subjective:    Patient ID: Andrew [name], male    DOB: [birth year], 84 y.o.   MRN: [id number]  HPI  Andrew [name] is here for LUE mass F/U. Please see the A&P for the status of the pt's chronic medical problems.  ROS : per ROS section and in problem oriented charting. All other systems are negative.  PMHx, Soc hx, and / or Fam hx : He continues to see the [place] but I have no records.  Review of Systems  Constitutional: Positive for unexpected weight change.  Gastrointestinal: Negative for anal bleeding.  Genitourinary:       Urinary incontinence Nocturia  Neurological: Positive for numbness.       No falls, no gait issues as long as he takes it slow       Objective:   Physical Exam  Constitutional: He appears well-developed and well-nourished. No distress.  HENT:  Head: Normocephalic and atraumatic.  Right Ear: External ear normal.  Left Ear: External ear normal.  Nose: Nose normal.  There is scant dried blood on the right earlobe  Cardiovascular: An irregularly irregular rhythm present.  No murmur heard. Pulmonary/Chest: [place] normal and breath sounds normal.  Abdominal: Soft.  Well-healed midline incision with a left sided hernia  Musculoskeletal: He exhibits edema. He exhibits no tenderness.  Neurological: He is alert.  Mild thenar wasting with protuberance of the tendon.  Grip strength bilaterally 5 out of 5. finger strength 5 out of 5 bilaterally  Skin: Skin is warm and dry. He is not diaphoretic.          Assessment & Plan:

## 2018-07-17 NOTE — Assessment & Plan Note (Signed)
This problem is chronic and stable.  He is on warfarin and get an INR checked today.  He is also on Toprol 12.5 at bedtime.  He is rate controlled.  He states he does experience palpitations  PLAN:  Cont current meds

## 2018-07-17 NOTE — Assessment & Plan Note (Signed)
This problem is chronic and uncontrolled.  He recently had his nerve conduction study results which showed  1.  Severe bilateral carpal tunnel syndrome.  He was not aware of this diagnosis but as he started to talk about it, he stated the [place] gave him the wrist braces.  So I suspect that this is not a new diagnosis. He is not using the braces.  He has numbness in his hands bilaterally.  He cannot open jars because he cannot feel controller.  All of the fingers are numb.  He has not dropped anything out of his hands and has not noticed any weakness.  We discussed medical treatment versus surgical treatment and we both felt medical treatment is indicated at this time.  I encouraged him to wear his wrist braces but discussed that if he would develop weakness of his hands, that would be an indication to consider surgery.  PLAN : Nightly wrist splinting using [facility]   2.  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  I do not he is on B12 deficiency so this is unlikely the etiology.  To the best of my knowledge, he is not diabetic although he might have been in the past.  He has not been checked for HIV or multiple myeloma but my pretest probability for these 2 diagnoses are low.  Nor has she been checked with a TSH although this is likely done at the [place].  We will attempt to get records rather than repeat this test.  He is on gabapentin 100 2 pills at bedtime and is happy with the effect of this medication.  PLAN : Gabapentin 100 mg 2 pills QHS   3.  Right lumbosacral radiculopathy L4 L5-S1 -the neurologist recommended and ordered a lumbar MRI due to the findings along with his gait abnormality and urinary symptoms.  He has not completed this because he is more worried about his wife's health at this time.  Today, he continues to want to delay the MRI until his wife's health is settled.  He denies any radicular pain down his right  leg and states that it is his left leg that drives.  PLAN : Lumbar MRI when patient desires

## 2018-07-17 NOTE — Assessment & Plan Note (Signed)
I will get him scheduled in [facility] for a bedside ultrasound of his left upper extremity.  The lesion is about 3 to 4 cm on the lateral upper arm and is fluctuant and well-circumscribed.  There is no pain or overlying erythema.  I suspect this is a lipoma but the ultrasound may help to further characterize it.  He is also seeing the [facility] for this lesion.

## 2018-07-17 NOTE — Progress Notes (Signed)
Patient was seen in clinic with Gwenlyn Found, PharmD, PGY1 pharmacy resident. I agree with the assessment and plan of care documented.

## 2018-07-28 ENCOUNTER — Ambulatory Visit (INDEPENDENT_AMBULATORY_CARE_PROVIDER_SITE_OTHER): Payer: Medicare Other | Admitting: Internal Medicine

## 2018-07-28 ENCOUNTER — Encounter: Payer: Self-pay | Admitting: Internal Medicine

## 2018-07-28 ENCOUNTER — Other Ambulatory Visit: Payer: Self-pay

## 2018-07-28 DIAGNOSIS — D1722 Benign lipomatous neoplasm of skin and subcutaneous tissue of left arm: Secondary | ICD-10-CM | POA: Diagnosis not present

## 2018-07-28 NOTE — Patient Instructions (Signed)
Thank you for coming to the clinic today. It was a pleasure to see you.   For the mass in your shoulder, we will have someone take a sample of the swollen area on your arm.   FOLLOW-UP INSTRUCTIONS When: 1 month with Dr. Lynnae January   For: follow up of your general health  What to bring: all of your medication bottles   Please call the internal medicine center clinic if you have any questions or concerns, we may be able to help and keep you from a long and expensive emergency room wait. Our clinic and after hours phone number is [phone], the best time to call is Monday through Friday 9 am to 4 pm but there is always someone available 24/7 if you have an emergency. If you need medication refills please notify your pharmacy one week in advance and they will send [place] a request.

## 2018-07-28 NOTE — Assessment & Plan Note (Addendum)
Andrew [name] was found to have a mobile mass on the left upper extremity at office visit with Dr. Lynnae January one week ago, he is her today for an ultrasound exam to further evaluate the mass. Ultrasound exam showed a heterogeneous mass with areas that were dark as if they are fluid filled and septated. The mass was well circumscribed and avascular and measured 2 cm x 1.1 cm x 2.6 cm. The patients daughter is with him today and expresses that they would prefer to opt for a less invasive management. These findings are not completely consistent with images of lipoma used for comparison.  - images will be uploaded into the chart for use in future follow up, will discuss the findings with Dr. Lynnae January

## 2018-07-28 NOTE — Progress Notes (Signed)
   CC: here for ultrasound of a left upper extremity mass   HPI:  Andrew [name] is a 84 y.o. with PMH as listed below who presents for follow up of left upper extremity mass. Please see the assessment and plans for the status of the patient chronic medical problems.   Past Medical History:  Diagnosis Date  . A-fib ([place])   . Cancer ([place])   . Diabetes mellitus without complication ([place])   . Dysrhythmia   . GI bleed 04/02/2014  . Hypertension   . Seizures ([place])    Review of Systems:  Refer to history of present illness and assessment and plans for pertinent review of systems, all others reviewed and negative  Physical Exam:  Vitals:   07/28/18 1612  BP: (!) 127/57  Pulse: 66  Temp: 98.7 F (37.1 C)  TempSrc: Oral  SpO2: 100%  Weight: 186 lb (84.4 kg)  Height: 5\' 10"  (1.778 m)   General well appearing, no acute distress  Skin: irregular circumscribed mobile mass on the left anterior upper arm   Assessment & Plan:   Lipoma of the left arm  Andrew [name] was found to have a mobile mass on the left upper extremity at office visit with Dr. Lynnae January one week ago, he is her today for an ultrasound exam to further evaluate the mass. Ultrasound exam showed a heterogeneous mass with areas that were dark as if they are fluid filled and septated. The mass was well circumscribed and avascular and measured 2 cm x 1.1 cm x 2.6 cm. The patients daughter is with him today and expresses that they would prefer to opt for a less invasive management. These findings are not completely consistent with images of lipoma used for comparison.  - images will be uploaded into the chart for use in future follow up, will discuss the findings with Dr. Lynnae January   See Encounters Tab for problem based charting.  Patient discussed with Dr. Rebeca Alert

## 2018-08-03 NOTE — Progress Notes (Signed)
Internal Medicine Clinic Attending  I saw and evaluated the patient.  I personally confirmed the key portions of the history and exam documented by Dr. Hetty Ely and I reviewed pertinent patient test results.  The assessment, diagnosis, and plan were formulated together and I agree with the documentation in the resident's note.  I personally supervised the POCUS by Dr. Hetty Ely. The appearance is more heterogenous than I would expect for a lipoma. We will discuss with Dr. Lynnae January, but he may require further evaluation either by imaging or biopsy.   Lenice Pressman, M.D., Ph.D.

## 2018-08-25 ENCOUNTER — Other Ambulatory Visit: Payer: Self-pay

## 2018-08-25 ENCOUNTER — Other Ambulatory Visit: Payer: Self-pay | Admitting: Pharmacist

## 2018-08-25 ENCOUNTER — Ambulatory Visit (INDEPENDENT_AMBULATORY_CARE_PROVIDER_SITE_OTHER): Payer: Medicare Other | Admitting: Internal Medicine

## 2018-08-25 ENCOUNTER — Ambulatory Visit (INDEPENDENT_AMBULATORY_CARE_PROVIDER_SITE_OTHER): Payer: Medicare Other | Admitting: Pharmacist

## 2018-08-25 ENCOUNTER — Encounter: Payer: Self-pay | Admitting: Internal Medicine

## 2018-08-25 VITALS — BP 132/48 | HR 58 | Temp 97.6°F | Ht 70.0 in | Wt 189.8 lb

## 2018-08-25 DIAGNOSIS — Z5181 Encounter for therapeutic drug level monitoring: Secondary | ICD-10-CM | POA: Diagnosis not present

## 2018-08-25 DIAGNOSIS — Z9181 History of falling: Secondary | ICD-10-CM

## 2018-08-25 DIAGNOSIS — R296 Repeated falls: Secondary | ICD-10-CM

## 2018-08-25 DIAGNOSIS — I4891 Unspecified atrial fibrillation: Secondary | ICD-10-CM

## 2018-08-25 DIAGNOSIS — R2232 Localized swelling, mass and lump, left upper limb: Secondary | ICD-10-CM | POA: Diagnosis not present

## 2018-08-25 DIAGNOSIS — W19XXXA Unspecified fall, initial encounter: Secondary | ICD-10-CM

## 2018-08-25 DIAGNOSIS — Z7901 Long term (current) use of anticoagulants: Secondary | ICD-10-CM | POA: Diagnosis not present

## 2018-08-25 DIAGNOSIS — E1140 Type 2 diabetes mellitus with diabetic neuropathy, unspecified: Secondary | ICD-10-CM

## 2018-08-25 DIAGNOSIS — D1722 Benign lipomatous neoplasm of skin and subcutaneous tissue of left arm: Secondary | ICD-10-CM

## 2018-08-25 LAB — POCT INR: INR: 2.8 (ref 2.0–3.0)

## 2018-08-25 MED ORDER — WARFARIN SODIUM 6 MG PO TABS: ORAL_TABLET | ORAL | 3 refills | Status: DC

## 2018-08-25 NOTE — Assessment & Plan Note (Addendum)
Patient presenting for follow up of LUE mass, suspected to be lipoma. U/S performed on 11/4 not entirely consisted with lipoma due to apparent heterogeneity and septations. He endorses some mild pain (to where he "notices it") a couple of nights a week at the site, but state the pain is gone by morning without intervention. Will obtain formal U/S as POCUS at last visit was not entirely consistent with lipoma. patient to follow up for concerning symptoms. - LUE ultrasound

## 2018-08-25 NOTE — Progress Notes (Signed)
Anticoagulation Management Andrew [name] is a 84 y.o. male who reports to the clinic for monitoring of warfarin treatment.    Indication: atrial fibrillation.   Duration: indefinite Supervising physician: Aldine Contes  Anticoagulation Clinic Visit History: Patient does not report signs/symptoms of bleeding or thromboembolism  Other recent changes: No diet, medications, lifestyle changes.  Anticoagulation Episode Summary    Current INR goal:   2.0-3.0  TTR:   83.4 % (1.1 y)  Next INR check:   09/29/2018  INR from last check:   2.8 (08/25/2018)  Weekly max warfarin dose:     Target end date:     INR check location:     Preferred lab:     Send INR reminders to:   ANTICOAG IMP   Indications   Atrial fibrillation ([place]) [I48.91]       Comments:           Allergies  Allergen Reactions  . Tegretol [Carbamazepine] Other (See Comments)    Made patient's feet swell  . Depakote [Divalproex Sodium] Rash  . Dilantin [Phenytoin Sodium Extended] Rash  . Phenobarbital Rash   Prior to Admission medications   Medication Sig Start Date End Date Taking? Authorizing Provider  acetaminophen (TYLENOL) 325 MG tablet Take 2 tablets (650 mg total) by mouth every 6 (six) hours as needed for mild pain (or Fever >/= 101). 06/20/17  Yes Welford Roche, MD  Cholecalciferol (VITAMIN D-3) 1000 units CAPS Take 1,000 Units by mouth daily.   Yes [provider]  gabapentin (NEURONTIN) 100 MG capsule TAKE 1 CAPSULE BY MOUTH THREE TIMES A DAY 06/20/18  Yes Bartholomew Crews, MD  hydrocortisone cream 1 % Apply 1 application topically every 4 (four) hours.   Yes [provider]  metoprolol tartrate (LOPRESSOR) 25 MG tablet Take 12.5 mg by mouth at bedtime.    Yes [provider]  Multiple Vitamins-Minerals (ONE-A-DAY MENS 50+ ADVANTAGE) TABS Take 1 tablet by mouth at bedtime.   Yes [provider]  vitamin B-12 (CYANOCOBALAMIN) 1000 MCG tablet Take 1,000 mcg by  mouth daily.   Yes [provider]  warfarin (COUMADIN) 6 MG tablet Sun 3mg , Mon 3mg , Tue 6mg , Wed 3mg , Thur 6mg , Fri 3mg , Sat 6mg  06/16/18  Yes Bartholomew Crews, MD  zonisamide (ZONEGRAN) 100 MG capsule Take 300 mg by mouth 2 (two) times daily. MORNING and EVENING   Yes [provider]   Past Medical History:  Diagnosis Date  . A-fib ([place])   . Cancer ([place])   . Diabetes mellitus without complication ([place])   . Dysrhythmia   . GI bleed 04/02/2014  . Hypertension   . Seizures ([place])    Social History   Socioeconomic History  . Marital status: Married    Spouse name: Not on file  . Number of children: Not on file  . Years of education: Not on file  . Highest education level: Not on file  Occupational History  . Not on file  Social Needs  . Financial resource strain: Not on file  . Food insecurity:    Worry: Not on file    Inability: Not on file  . Transportation needs:    Medical: Not on file    Non-medical: Not on file  Tobacco Use  . Smoking status: Former Smoker    Last attempt to quit: 09/24/1969    Years since quitting: 48.9  . Smokeless tobacco: Never Used  Substance and Sexual Activity  . Alcohol use: No  .  Drug use: No  . Sexual activity: Not on file  Lifestyle  . Physical activity:    Days per week: Not on file    Minutes per session: Not on file  . Stress: Not on file  Relationships  . Social connections:    Talks on phone: Not on file    Gets together: Not on file    Attends religious service: Not on file    Active member of club or organization: Not on file    Attends meetings of clubs or organizations: Not on file    Relationship status: Not on file  Other Topics Concern  . Not on file  Social History Narrative  . Not on file   Family History  Problem Relation Age of Onset  . Diabetes Mellitus II Mother   . Prostate cancer Father   . Diabetes Mellitus II Brother     ASSESSMENT Recent Results: The most recent result is  correlated with 30 mg per week: Lab Results  Component Value Date   INR 2.8 08/25/2018   INR 2.6 07/17/2018   INR 3.8 (A) 06/13/2018    Anticoagulation Dosing: Description   Take 1 tablet of your teal 6mg  warfarin tablets by mouth once daily on Thursdays of each week, on all other days, take 1/2 tablet by mouth once daily.     INR today: Therapeutic  PLAN Weekly dose was decreased by 20% to 24 mg per week  Patient Instructions  Patient instructed to take medications as defined in the Anti-coagulation Track section of this encounter.  Patient instructed to take today's dose.  Patient instructed to take 1 tablet of your teal 6mg  warfarin tablets by mouth once daily on Thursdays of each week, on all other days, take 1/2 tablet by mouth once daily. Patient verbalized understanding of these instructions.     Patient advised to contact clinic or seek medical attention if signs/symptoms of bleeding or thromboembolism occur.  Patient verbalized understanding by repeating back information and was advised to contact me if further medication-related questions arise. Patient was also provided an information handout.  Follow-up Return in 5 weeks (on 09/29/2018) for Follow up INR at 1000h.  Pennie Banter, PharmD, CPP  15 minutes spent face-to-face with the patient during the encounter. 50% of time spent on education. 50% of time was spent on fingerstick point of care INR sample collection, processing, results determination, dose adjustment and documentation in http://www.kim.net/.

## 2018-08-25 NOTE — Patient Instructions (Signed)
Patient instructed to take medications as defined in the Anti-coagulation Track section of this encounter.  Patient instructed to take today's dose.  Patient instructed to take 1 tablet of your teal 6mg  warfarin tablets by mouth once daily on Thursdays of each week, on all other days, take 1/2 tablet by mouth once daily. Patient verbalized understanding of these instructions.

## 2018-08-25 NOTE — Assessment & Plan Note (Addendum)
Patient and family report recent fall at home. He states he was about to climb the stairs and did not have a good grip on the railing, which he attributes to his poor sensation 2/2 his neuropathy. He states he then slid to the floor. He had to helped up by family who state he injured his right elbow with some bleeding that resolved and is improved at this time. He denies no other injuries or residual pain for his fall. He states he has had no issues with falls before or after. His family states that he has appear to stable more when walking though patient denies this. On exam strength grossly intact. He was offered a referral to PT for further evaluation and strength training, which he agrees to. - Patient to return for decreasing strength or recurrent falls - PT referral

## 2018-08-25 NOTE — Progress Notes (Addendum)
PT   CC: Lipoma, Fall  HPI:  [name] is a 85 y.o. M with PMHx listed below presenting for Lipoma, Fall. Please see the A&P for the status of the patient's chronic medical problems.   Past Medical History:  Diagnosis Date  . A-fib ([place])   . Cancer ([place])   . Diabetes mellitus without complication ([place])   . Dysrhythmia   . GI bleed 04/02/2014  . Hypertension   . Seizures ([place])    Review of Systems:  Performed and all others negative.  Physical Exam:  Vitals:   08/25/18 1007  BP: (!) 132/48  Pulse: (!) 58  Temp: 97.6 F (36.4 C)  TempSrc: Oral  SpO2: 100%  Weight: 189 lb 12.8 oz (86.1 kg)  Height: 5\' 10"  (1.778 m)   Physical Exam  Constitutional: He appears well-developed and well-nourished. No distress.  Pleasant male in motorized scooter  Cardiovascular: Normal rate and normal heart sounds.  Irregularly Irregular rhythm  Pulmonary/Chest: [place] normal and breath sounds normal. No respiratory distress.  Abdominal: Soft. Bowel sounds are normal. He exhibits no distension. There is no tenderness.  Musculoskeletal:  Strength 5/5 Bilat UEs and LEs  Skin: Skin is warm and dry.     Assessment & Plan:   See Encounters Tab for problem based charting.  Patient discussed with Dr. Dareen Piano

## 2018-08-25 NOTE — Patient Instructions (Addendum)
Thank you for allowing [place] to care for you  For your LUE mass - We ordered an a formal ultrasound to further evaluate this mass  For your recent fall - Continue to do home exercises to keep your strength up - We have ordered a referral to physical therapy for you - Return to clinic if you experience recurrent fall or weakness  Follow up PCP in about 5 moths (approx 01/16/2019)

## 2018-08-27 NOTE — Progress Notes (Signed)
INTERNAL MEDICINE TEACHING ATTENDING ADDENDUM - Mishika Flippen M.D  Duration- indefinite, Indication- afib, INR- therapeutic. Agree with pharmacy recommendations as outlined in their note.     

## 2018-08-27 NOTE — Addendum Note (Signed)
Addended by: Neva Seat B on: 08/27/2018 01:45 PM   Modules accepted: Orders

## 2018-08-27 NOTE — Progress Notes (Signed)
Internal Medicine Clinic Attending  Case discussed with Dr. Melvin  at the time of the visit.  We reviewed the resident's history and exam and pertinent patient test results.  I agree with the assessment, diagnosis, and plan of care documented in the resident's note.  

## 2018-08-28 ENCOUNTER — Ambulatory Visit: Payer: Medicare Other | Admitting: Pharmacist

## 2018-08-29 ENCOUNTER — Telehealth: Payer: Self-pay | Admitting: Internal Medicine

## 2018-08-29 ENCOUNTER — Ambulatory Visit (HOSPITAL_COMMUNITY)
Admission: RE | Admit: 2018-08-29 | Discharge: 2018-08-29 | Disposition: A | Discharge status: Home / Self Care | Payer: Medicare Other | Source: Ambulatory Visit | Attending: Internal Medicine | Admitting: Internal Medicine

## 2018-08-29 DIAGNOSIS — R2232 Localized swelling, mass and lump, left upper limb: Secondary | ICD-10-CM | POA: Insufficient documentation

## 2018-08-29 DIAGNOSIS — D1722 Benign lipomatous neoplasm of skin and subcutaneous tissue of left arm: Secondary | ICD-10-CM

## 2018-08-29 NOTE — Telephone Encounter (Signed)
Patient called an informed of the result of his recent ultrasound. He was also informed that we would be placing a referral to general surgery to have a biopsy taken of the lesion for further work up. Patient agree and has no questions at this time.   Andrew [name], PGY-2

## 2018-09-04 NOTE — Addendum Note (Signed)
Addended by: Hulan Fray on: 09/04/2018 05:40 PM   Modules accepted: Orders

## 2018-09-29 ENCOUNTER — Encounter: Payer: Self-pay | Admitting: Pharmacist

## 2018-09-29 ENCOUNTER — Ambulatory Visit (INDEPENDENT_AMBULATORY_CARE_PROVIDER_SITE_OTHER): Payer: Medicare Other | Admitting: Pharmacist

## 2018-09-29 DIAGNOSIS — Z5181 Encounter for therapeutic drug level monitoring: Secondary | ICD-10-CM

## 2018-09-29 DIAGNOSIS — I4891 Unspecified atrial fibrillation: Secondary | ICD-10-CM

## 2018-09-29 DIAGNOSIS — Z7901 Long term (current) use of anticoagulants: Secondary | ICD-10-CM

## 2018-09-29 LAB — POCT INR: INR: 2 (ref 2.0–3.0)

## 2018-09-29 MED ORDER — WARFARIN SODIUM 6 MG PO TABS: ORAL_TABLET | ORAL | 3 refills | Status: DC

## 2018-09-29 NOTE — Patient Instructions (Signed)
Patient instructed to take medications as defined in the Anti-coagulation Track section of this encounter.  Patient instructed to take today's dose.  Patient instructed to take 1 tablet of your teal 6mg  warfarin tablets by mouth once daily on Thursdays of each week, on all other days, take 1/2 tablet by mouth once daily. Patient verbalized understanding of these instructions.

## 2018-09-29 NOTE — Progress Notes (Signed)
Anticoagulation Management Andrew [name] is a 85 y.o. male who reports to the clinic for monitoring of warfarin treatment.    Indication: atrial fibrillation.   Duration: indefinite Supervising physician: Aldine Contes  Anticoagulation Clinic Visit History: Patient does not report signs/symptoms of bleeding or thromboembolism  Other recent changes: No diet, medications, lifestyle changes.  Anticoagulation Episode Summary    Current INR goal:   2.0-3.0  TTR:   84.8 % (1.2 y)  Next INR check:   10/20/2018  INR from last check:   2.0 (09/29/2018)  Weekly max warfarin dose:     Target end date:     INR check location:     Preferred lab:     Send INR reminders to:   [facility]   Indications   Atrial fibrillation ([place]) [I48.91]       Comments:           Allergies  Allergen Reactions  . Tegretol [Carbamazepine] Other (See Comments)    Made patient's feet swell  . Depakote [Divalproex Sodium] Rash  . Dilantin [Phenytoin Sodium Extended] Rash  . Phenobarbital Rash   Prior to Admission medications   Medication Sig Start Date End Date Taking? Authorizing Provider  acetaminophen (TYLENOL) 325 MG tablet Take 2 tablets (650 mg total) by mouth every 6 (six) hours as needed for mild pain (or Fever >/= 101). 06/20/17  Yes Welford Roche, MD  Cholecalciferol (VITAMIN D-3) 1000 units CAPS Take 1,000 Units by mouth daily.   Yes [provider]  gabapentin (NEURONTIN) 100 MG capsule TAKE 1 CAPSULE BY MOUTH THREE TIMES A DAY 06/20/18  Yes Bartholomew Crews, MD  hydrocortisone cream 1 % Apply 1 application topically every 4 (four) hours.   Yes [provider]  metoprolol tartrate (LOPRESSOR) 25 MG tablet Take 12.5 mg by mouth at bedtime.    Yes [provider]  Multiple Vitamins-Minerals (ONE-A-DAY MENS 50+ ADVANTAGE) TABS Take 1 tablet by mouth at bedtime.   Yes [provider]  vitamin B-12 (CYANOCOBALAMIN) 1000 MCG tablet Take 1,000 mcg by  mouth daily.   Yes [provider]  warfarin (COUMADIN) 6 MG tablet Take one-half (1/2) tablet by mouth, once-daily at 6PM--EXCEPT on THURSDAYS, take one (1) tablet each Thursday. 09/29/18  Yes Pennie Banter, RPH-CPP  zonisamide (ZONEGRAN) 100 MG capsule Take 300 mg by mouth 2 (two) times daily. MORNING and EVENING   Yes [provider]   Past Medical History:  Diagnosis Date  . A-fib ([place])   . Cancer ([place])   . Diabetes mellitus without complication ([place])   . Dysrhythmia   . GI bleed 04/02/2014  . Hypertension   . Seizures ([place])    Social History   Socioeconomic History  . Marital status: Married    Spouse name: Not on file  . Number of children: Not on file  . Years of education: Not on file  . Highest education level: Not on file  Occupational History  . Not on file  Social Needs  . Financial resource strain: Not on file  . Food insecurity:    Worry: Not on file    Inability: Not on file  . Transportation needs:    Medical: Not on file    Non-medical: Not on file  Tobacco Use  . Smoking status: Former Smoker    Last attempt to quit: 09/24/1969    Years since quitting: 49.0  . Smokeless tobacco: Never Used  Substance and Sexual Activity  . Alcohol  use: No  . Drug use: No  . Sexual activity: Not on file  Lifestyle  . Physical activity:    Days per week: Not on file    Minutes per session: Not on file  . Stress: Not on file  Relationships  . Social connections:    Talks on phone: Not on file    Gets together: Not on file    Attends religious service: Not on file    Active member of club or organization: Not on file    Attends meetings of clubs or organizations: Not on file    Relationship status: Not on file  Other Topics Concern  . Not on file  Social History Narrative  . Not on file   Family History  Problem Relation Age of Onset  . Diabetes Mellitus II Mother   . Prostate cancer Father   . Diabetes Mellitus II Brother      ASSESSMENT Recent Results: The most recent result is correlated with 24 mg per week: Lab Results  Component Value Date   INR 2.0 09/29/2018   INR 2.8 08/25/2018   INR 2.6 07/17/2018    Anticoagulation Dosing: Description   Take 1 tablet of your teal 6mg  warfarin tablets by mouth once daily on Thursdays of each week, on all other days, take 1/2 tablet by mouth once daily.     INR today: Therapeutic  PLAN Weekly dose was unchanged.   Patient Instructions  Patient instructed to take medications as defined in the Anti-coagulation Track section of this encounter.  Patient instructed to take today's dose.  Patient instructed to take 1 tablet of your teal 6mg  warfarin tablets by mouth once daily on Thursdays of each week, on all other days, take 1/2 tablet by mouth once daily. Patient verbalized understanding of these instructions.     Patient advised to contact clinic or seek medical attention if signs/symptoms of bleeding or thromboembolism occur.  Patient verbalized understanding by repeating back information and was advised to contact me if further medication-related questions arise. Patient was also provided an information handout.  Follow-up Return in 3 weeks (on 10/20/2018) for Follow up INR at 1045h.  Pennie Banter, PharmD, CPP  15 minutes spent face-to-face with the patient during the encounter. 50% of time spent on education. 50% of time was spent on fingerstick point of care INR sample collection, processing, results determination and documentation in [email or web address].[email or web address]/.

## 2018-09-30 NOTE — Progress Notes (Signed)
INTERNAL MEDICINE TEACHING ATTENDING ADDENDUM - Alleyne Lac M.D  Duration- indefinite, Indication- afib, INR- therapeutic. Agree with pharmacy recommendations as outlined in their note.     

## 2018-10-06 ENCOUNTER — Telehealth: Payer: Self-pay | Admitting: *Deleted

## 2018-10-06 NOTE — Telephone Encounter (Signed)
LVM FOR PATIENT REGARDING THIS REFERRAL. PATIENT NO SHOWED FOR 09-29-2018 APPOINTMENT/ NEW APPOINTMENT IS  10-21-2018 TO ARRIVE 2PM. IF UNABLE TO MAKE THIS APPOINTMENT OR NEEDING TO CHANGE PLEASE CALL THEIR OFFICE AT [phone].

## 2018-10-20 ENCOUNTER — Ambulatory Visit (INDEPENDENT_AMBULATORY_CARE_PROVIDER_SITE_OTHER): Payer: Medicare Other | Admitting: Pharmacist

## 2018-10-20 ENCOUNTER — Encounter: Payer: Self-pay | Admitting: Pharmacist

## 2018-10-20 DIAGNOSIS — I4891 Unspecified atrial fibrillation: Secondary | ICD-10-CM

## 2018-10-20 DIAGNOSIS — Z5181 Encounter for therapeutic drug level monitoring: Secondary | ICD-10-CM | POA: Diagnosis not present

## 2018-10-20 DIAGNOSIS — Z7901 Long term (current) use of anticoagulants: Secondary | ICD-10-CM | POA: Diagnosis not present

## 2018-10-20 LAB — POCT INR: INR: 2.2 (ref 2.0–3.0)

## 2018-10-20 NOTE — Patient Instructions (Signed)
Patient instructed to take medications as defined in the Anti-coagulation Track section of this encounter.  Patient instructed to take today's dose.  Patient instructed to take  1 tablet of your teal 6mg  warfarin tablets by mouth once daily on Mondays and Thursdays of each week, on all other days, take 1/2 tablet by mouth once daily. Patient verbalized understanding of these instructions.

## 2018-10-20 NOTE — Progress Notes (Signed)
Anticoagulation Management Andrew [name] is a 85 y.o. male who reports to the clinic for monitoring of warfarin treatment.    Indication: atrial fibrillation and long term (current) use of anticoagulant.  Duration: indefinite Supervising physician: Aldine Contes  Anticoagulation Clinic Visit History: Patient does not report signs/symptoms of bleeding or thromboembolism  Other recent changes: No diet, medications, lifestyle changes endorsed.  Anticoagulation Episode Summary    Current INR goal:   2.0-3.0  TTR:   85.4 % (1.3 y)  Next INR check:   11/17/2018  INR from last check:   2.2 (10/20/2018)  Weekly max warfarin dose:     Target end date:     INR check location:     Preferred lab:     Send INR reminders to:   [facility]   Indications   Atrial fibrillation ([place]) [I48.91]       Comments:           Allergies  Allergen Reactions  . Tegretol [Carbamazepine] Other (See Comments)    Made patient's feet swell  . Depakote [Divalproex Sodium] Rash  . Dilantin [Phenytoin Sodium Extended] Rash  . Phenobarbital Rash   Prior to Admission medications   Medication Sig Start Date End Date Taking? Authorizing Provider  acetaminophen (TYLENOL) 325 MG tablet Take 2 tablets (650 mg total) by mouth every 6 (six) hours as needed for mild pain (or Fever >/= 101). 06/20/17  Yes Welford Roche, MD  Cholecalciferol (VITAMIN D-3) 1000 units CAPS Take 1,000 Units by mouth daily.   Yes [provider]  gabapentin (NEURONTIN) 100 MG capsule TAKE 1 CAPSULE BY MOUTH THREE TIMES A DAY 06/20/18  Yes Bartholomew Crews, MD  hydrocortisone cream 1 % Apply 1 application topically every 4 (four) hours.   Yes [provider]  metoprolol tartrate (LOPRESSOR) 25 MG tablet Take 12.5 mg by mouth at bedtime.    Yes [provider]  Multiple Vitamins-Minerals (ONE-A-DAY MENS 50+ ADVANTAGE) TABS Take 1 tablet by mouth at bedtime.   Yes [provider]  vitamin B-12  (CYANOCOBALAMIN) 1000 MCG tablet Take 1,000 mcg by mouth daily.   Yes [provider]  warfarin (COUMADIN) 6 MG tablet Take one-half (1/2) tablet by mouth, once-daily at 6PM--EXCEPT on THURSDAYS, take one (1) tablet each Thursday. 09/29/18  Yes Pennie Banter, RPH-CPP  zonisamide (ZONEGRAN) 100 MG capsule Take 300 mg by mouth 2 (two) times daily. MORNING and EVENING   Yes [provider]   Past Medical History:  Diagnosis Date  . A-fib ([place])   . Cancer ([place])   . Diabetes mellitus without complication ([place])   . Dysrhythmia   . GI bleed 04/02/2014  . Hypertension   . Seizures ([place])    Social History   Socioeconomic History  . Marital status: Married    Spouse name: Not on file  . Number of children: Not on file  . Years of education: Not on file  . Highest education level: Not on file  Occupational History  . Not on file  Social Needs  . Financial resource strain: Not on file  . Food insecurity:    Worry: Not on file    Inability: Not on file  . Transportation needs:    Medical: Not on file    Non-medical: Not on file  Tobacco Use  . Smoking status: Former Smoker    Last attempt to quit: 09/24/1969    Years since quitting: 49.1  . Smokeless tobacco: Never Used  Substance and Sexual Activity  . Alcohol use: No  . Drug use: No  . Sexual activity: Not on file  Lifestyle  . Physical activity:    Days per week: Not on file    Minutes per session: Not on file  . Stress: Not on file  Relationships  . Social connections:    Talks on phone: Not on file    Gets together: Not on file    Attends religious service: Not on file    Active member of club or organization: Not on file    Attends meetings of clubs or organizations: Not on file    Relationship status: Not on file  Other Topics Concern  . Not on file  Social History Narrative  . Not on file   Family History  Problem Relation Age of Onset  . Diabetes Mellitus II Mother   . Prostate cancer Father    . Diabetes Mellitus II Brother     ASSESSMENT Recent Results: The most recent result is correlated with 24 mg per week: Lab Results  Component Value Date   INR 2.2 10/20/2018   INR 2.0 09/29/2018   INR 2.8 08/25/2018    Anticoagulation Dosing: Description   Take 1 tablet of your teal 6mg  warfarin tablets by mouth once daily on Mondays and Thursdays of each week, on all other days, take 1/2 tablet by mouth once daily.     INR today: Therapeutic  PLAN Weekly dose was increased by 12% to 27 mg per week  Patient Instructions  Patient instructed to take medications as defined in the Anti-coagulation Track section of this encounter.  Patient instructed to take today's dose.  Patient instructed to take  1 tablet of your teal 6mg  warfarin tablets by mouth once daily on Mondays and Thursdays of each week, on all other days, take 1/2 tablet by mouth once daily. Patient verbalized understanding of these instructions.     Patient advised to contact clinic or seek medical attention if signs/symptoms of bleeding or thromboembolism occur.  Patient verbalized understanding by repeating back information and was advised to contact me if further medication-related questions arise. Patient was also provided an information handout.  Follow-up Return in 4 weeks (on 11/17/2018) for Follow up INR at 1115h.  Pennie Banter, PharmD , CPP  15 minutes spent face-to-face with the patient during the encounter. 50% of time spent on education. 50% of time was spent on fingerstick point of care INR sample collection, processing, results determination, dose adjustment and documentation in http://www.[email or web address]/.

## 2018-10-21 DIAGNOSIS — L729 Follicular cyst of the skin and subcutaneous tissue, unspecified: Secondary | ICD-10-CM | POA: Diagnosis not present

## 2018-10-22 NOTE — Progress Notes (Signed)
INTERNAL MEDICINE TEACHING ATTENDING ADDENDUM - Yulisa Chirico M.D  Duration- indefinite, Indication- afib, INR- therapeutic. Agree with pharmacy recommendations as outlined in their note.     

## 2018-10-28 DIAGNOSIS — H401124 Primary open-angle glaucoma, left eye, indeterminate stage: Secondary | ICD-10-CM | POA: Diagnosis not present

## 2018-11-17 ENCOUNTER — Ambulatory Visit (INDEPENDENT_AMBULATORY_CARE_PROVIDER_SITE_OTHER): Payer: Medicare Other | Admitting: Pharmacist

## 2018-11-17 DIAGNOSIS — Z5181 Encounter for therapeutic drug level monitoring: Secondary | ICD-10-CM

## 2018-11-17 DIAGNOSIS — Z7901 Long term (current) use of anticoagulants: Secondary | ICD-10-CM | POA: Diagnosis not present

## 2018-11-17 DIAGNOSIS — I4891 Unspecified atrial fibrillation: Secondary | ICD-10-CM | POA: Diagnosis not present

## 2018-11-17 LAB — POCT INR: INR: 2.2 (ref 2.0–3.0)

## 2018-11-17 NOTE — Progress Notes (Signed)
Anticoagulation Management Andrew [name] is a 85 y.o. male who reports to the clinic for monitoring of warfarin treatment.    Indication: atrial fibrillation and long term (current) use of anticoagulation. Duration: indefinite Supervising physician: Oval Linsey  Anticoagulation Clinic Visit History: Patient does not report signs/symptoms of bleeding or thromboembolism. Other recent changes: No diet, medications, lifestyle changes endorsed. Anticoagulation Episode Summary    Current INR goal:   2.0-3.0  TTR:   86.3 % (1.3 y)  Next INR check:   11/17/2018  INR from last check:   2.2 (10/20/2018)  Most recent INR:    2.2 (11/17/2018)  Weekly max warfarin dose:     Target end date:     INR check location:     Preferred lab:     Send INR reminders to:   [facility]   Indications   Atrial fibrillation ([place]) [I48.91]       Comments:           Allergies  Allergen Reactions  . Tegretol [Carbamazepine] Other (See Comments)    Made patient's feet swell  . Depakote [Divalproex Sodium] Rash  . Dilantin [Phenytoin Sodium Extended] Rash  . Phenobarbital Rash   Prior to Admission medications   Medication Sig Start Date End Date Taking? Authorizing Provider  acetaminophen (TYLENOL) 325 MG tablet Take 2 tablets (650 mg total) by mouth every 6 (six) hours as needed for mild pain (or Fever >/= 101). 06/20/17   Welford Roche, MD  Cholecalciferol (VITAMIN D-3) 1000 units CAPS Take 1,000 Units by mouth daily.    [provider]  gabapentin (NEURONTIN) 100 MG capsule TAKE 1 CAPSULE BY MOUTH THREE TIMES A DAY 06/20/18   Bartholomew Crews, MD  hydrocortisone cream 1 % Apply 1 application topically every 4 (four) hours.    [provider]  metoprolol tartrate (LOPRESSOR) 25 MG tablet Take 12.5 mg by mouth at bedtime.     [provider]  Multiple Vitamins-Minerals (ONE-A-DAY MENS 50+ ADVANTAGE) TABS Take 1 tablet by mouth at bedtime.    [provider]   vitamin B-12 (CYANOCOBALAMIN) 1000 MCG tablet Take 1,000 mcg by mouth daily.    [provider]  warfarin (COUMADIN) 6 MG tablet Take one-half (1/2) tablet by mouth, once-daily at 6PM--EXCEPT on THURSDAYS, take one (1) tablet each Thursday. 09/29/18   Pennie Banter, RPH-CPP  zonisamide (ZONEGRAN) 100 MG capsule Take 300 mg by mouth 2 (two) times daily. MORNING and EVENING    [provider]   Past Medical History:  Diagnosis Date  . A-fib ([place])   . Cancer ([place])   . Diabetes mellitus without complication ([place])   . Dysrhythmia   . GI bleed 04/02/2014  . Hypertension   . Seizures ([place])    Social History   Socioeconomic History  . Marital status: Married    Spouse name: Not on file  . Number of children: Not on file  . Years of education: Not on file  . Highest education level: Not on file  Occupational History  . Not on file  Social Needs  . Financial resource strain: Not on file  . Food insecurity:    Worry: Not on file    Inability: Not on file  . Transportation needs:    Medical: Not on file    Non-medical: Not on file  Tobacco Use  . Smoking status: Former Smoker    Last attempt to quit: 09/24/1969    Years since quitting: 49.1  .  Smokeless tobacco: Never Used  Substance and Sexual Activity  . Alcohol use: No  . Drug use: No  . Sexual activity: Not on file  Lifestyle  . Physical activity:    Days per week: Not on file    Minutes per session: Not on file  . Stress: Not on file  Relationships  . Social connections:    Talks on phone: Not on file    Gets together: Not on file    Attends religious service: Not on file    Active member of club or organization: Not on file    Attends meetings of clubs or organizations: Not on file    Relationship status: Not on file  Other Topics Concern  . Not on file  Social History Narrative  . Not on file   Family History  Problem Relation Age of Onset  . Diabetes Mellitus II Mother   . Prostate cancer  Father   . Diabetes Mellitus II Brother     ASSESSMENT Recent Results: The most recent result is correlated with 27 mg per week: Lab Results  Component Value Date   INR 2.2 11/17/2018   INR 2.2 10/20/2018   INR 2.0 09/29/2018    Anticoagulation Dosing: Description   Take 1 tablet of your teal 6mg  warfarin tablets by mouth once daily on Mondays and Thursdays of each week, on all other days, take 1/2 tablet by mouth once daily.     INR today: Therapeutic  PLAN Weekly dose was unchanged.  Patient Instructions Patient instructed to continue taking same weekly dose of 27mg  warfarin (1 tablet of 6mg  teal warfarin on Mondays and Thursdays, and 1/2 tablet all other days).   Patient advised to contact clinic or seek medical attention if signs/symptoms of bleeding or thromboembolism occur.  Patient verbalized understanding by repeating back information and was advised to contact me if further medication-related questions arise. Patient was also provided an information handout.  Follow-up Return in 4 weeks (on 12/15/2018) for follow up INR at 10:00am.  Adella Hare, PharmD Candidate  15 minutes spent face-to-face with the patient during the encounter. 50% of time spent on education, including signs/sx bleeding and clotting, as well as food and drug interactions with warfarin. 50% of time was spent on fingerprick POC INR sample collection,processing, results determination, and documentation in http://www.kim.net/.

## 2018-11-27 ENCOUNTER — Other Ambulatory Visit: Payer: Self-pay

## 2018-11-27 ENCOUNTER — Ambulatory Visit (INDEPENDENT_AMBULATORY_CARE_PROVIDER_SITE_OTHER): Payer: Medicare Other | Admitting: Internal Medicine

## 2018-11-27 VITALS — BP 115/57 | HR 78 | Temp 97.6°F | Wt 179.8 lb

## 2018-11-27 DIAGNOSIS — Z8639 Personal history of other endocrine, nutritional and metabolic disease: Secondary | ICD-10-CM

## 2018-11-27 DIAGNOSIS — I714 Abdominal aortic aneurysm, without rupture, unspecified: Secondary | ICD-10-CM

## 2018-11-27 DIAGNOSIS — L723 Sebaceous cyst: Secondary | ICD-10-CM

## 2018-11-27 DIAGNOSIS — Z85038 Personal history of other malignant neoplasm of large intestine: Secondary | ICD-10-CM

## 2018-11-27 DIAGNOSIS — I4891 Unspecified atrial fibrillation: Secondary | ICD-10-CM

## 2018-11-27 DIAGNOSIS — G629 Polyneuropathy, unspecified: Secondary | ICD-10-CM

## 2018-11-27 DIAGNOSIS — N183 Chronic kidney disease, stage 3 unspecified: Secondary | ICD-10-CM

## 2018-11-27 DIAGNOSIS — D649 Anemia, unspecified: Secondary | ICD-10-CM

## 2018-11-27 DIAGNOSIS — R634 Abnormal weight loss: Secondary | ICD-10-CM

## 2018-11-27 LAB — POCT GLYCOSYLATED HEMOGLOBIN (HGB A1C): Hemoglobin A1C: 5.8 % — AB (ref 4.0–5.6)

## 2018-11-27 LAB — GLUCOSE, CAPILLARY: Glucose-Capillary: 109 mg/dL — ABNORMAL HIGH (ref 70–99)

## 2018-11-27 NOTE — Assessment & Plan Note (Signed)
This problem is chronic and stable.  His most recent GFR was 46.  He is not diabetic, his blood pressure is well controlled.  He has no known heart failure.  As his GFR is stable, I will check a periodically and I am checking a BMP today.  PLAN : follow BMP

## 2018-11-27 NOTE — Assessment & Plan Note (Signed)
This problem is chronic and stable.  He has been on metformin previously but has not been on it for a while.  We have discussed that I would check his A1c maybe once a year and it was 5.8 today.  PLAN : follow

## 2018-11-27 NOTE — Patient Instructions (Signed)
1. Please get the back MRI 2. Pls get the ultrasound of your abdomin 3. I will mail you your test results

## 2018-11-27 NOTE — Assessment & Plan Note (Signed)
This problem is chronic and stable.  His hemoglobin has been about 9 for the past 4 years.  He states he has a history of colon cancer and had part of his intestines resected but we have not been able to get any of these records.  I am going to start with a ferritin and a repeat CBC.  He would not need screening but would require surveillance.  He has had no surveillance as far as he knows since the surgery.  These records will be at the [place] but we have not been successful in getting them.  I will need to continue to broach the subject with him and whether he would want a surveillance colonoscopy or whether he would rather not.  He has not been anxious recently to get any testing for himself (MRI) as he has been more focused on his wife's health.  He was not overly concerned about a recurrence of colon cancer currently.  PLAN: Continue to discuss

## 2018-11-27 NOTE — Progress Notes (Signed)
   Subjective:    Patient ID: Andrew [name], male    DOB: 1933/10/13, 85 y.o.   MRN: [id number]  HPI  Andrew [name] is here for back pain. Please see the A&P for the status of the pt's chronic medical problems.  ROS : per ROS section and in problem oriented charting. All other systems are negative.  PMHx, Soc hx, and / or Fam hx : He lives with his wife.  His daughter drives him and his wife to their doctor's appointments.  Review of Systems  Constitutional: Positive for unexpected weight change.  Gastrointestinal: Negative for blood in stool.  Musculoskeletal: Positive for back pain, neck pain and neck stiffness.  Neurological:       Numbness of hands bilaterally       Objective:   Physical Exam Vitals signs and nursing note reviewed.  Constitutional:      General: He is not in acute distress.    Appearance: Normal appearance. He is not ill-appearing, toxic-appearing or diaphoretic.  HENT:     Head: Normocephalic and atraumatic.     Right Ear: External ear normal.     Left Ear: External ear normal.     Nose: No congestion or rhinorrhea.  Eyes:     General: No scleral icterus.       Right eye: No discharge.        Left eye: No discharge.     Conjunctiva/sclera: Conjunctivae normal.  Cardiovascular:     Rate and Rhythm: Normal rate and regular rhythm.     Heart sounds: No murmur.  Musculoskeletal:     Comments: Trace lower extremity edema bilaterally  Skin:    General: Skin is warm and dry.     Comments: He has 1 to 2 mm macules on hands and forearms and chest that are pink and slightly scaly.    Neurological:     Mental Status: He is alert.     Comments: Strength in bilateral lower extremities is 5 out of 5  Psychiatric:        Mood and Affect: Mood normal.        Behavior: Behavior normal.        Thought Content: Thought content normal.        Judgment: Judgment normal.           Assessment & Plan:

## 2018-11-27 NOTE — Assessment & Plan Note (Signed)
On further chart review, he had a colonoscopy by Dr. Deatra Ina in July 2015 which showed a 3 mm sessile polyp in the sigmoid colon.  Pathology showed tubular adenoma without high-grade dysplasia.  He will need a surveillance colonoscopy 7 to 10 years after that which will be 2022.  It is reassuring that he had this test done and I am entering it into his overview.

## 2018-11-27 NOTE — Assessment & Plan Note (Signed)
1. Severe bilateral carpal tunnel syndrome.  He continues to have numbness of his hands bilaterally.  He states he is using his splints nightly.  PLAN : Nightly wrist splinting [facility]   2.  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  He states it did not improve much after he stopped the gabapentin.  He has had a B12 level checked, and A1c as he has history of diabetes, I am checking a TSH today, I have not done SPEP yet.  PLAN :  Follow   3.  Right lumbosacral radiculopathy L4 L5-S1 - he states his back is aching and that his legs are not working which is causing him difficulty walking around.  These are the same symptoms that he had I have discussed previously and he has had them for months.  The daughter thinks that he is dragging his left leg a little bit.  He has almost fallen on occasions but has not actually had any falls.  He has not completed the MRI but states he will get it scheduled and completed.  PLAN : Lumbar MRI

## 2018-11-27 NOTE — Assessment & Plan Note (Signed)
This problem is new.  We went over the incidental finding on his CT scan.  He is in agreement to get a ultrasound to see the current size of the AAA.  PLAN : U/S

## 2018-11-27 NOTE — Assessment & Plan Note (Signed)
This problem is chronic and stable.  I had reviewed the surgeon's notes and it was decided to just follow the lesion.

## 2018-11-27 NOTE — Assessment & Plan Note (Signed)
This problem is chronic and stable.  He is rate controlled on Toprol 12.5 at bedtime and is on warfarin for anticoagulation.  He has no side effects to these medications.   PLAN:  Cont current meds

## 2018-11-28 ENCOUNTER — Encounter: Payer: Self-pay | Admitting: Internal Medicine

## 2018-11-28 LAB — CMP14 + ANION GAP
ALT: 11 IU/L (ref 0–44)
AST: 14 IU/L (ref 0–40)
Albumin/Globulin Ratio: 2 (ref 1.2–2.2)
Albumin: 4.3 g/dL (ref 3.6–4.6)
Alkaline Phosphatase: 70 IU/L (ref 39–117)
Anion Gap: 15 mmol/L (ref 10.0–18.0)
BUN/Creatinine Ratio: 15 (ref 10–24)
BUN: 29 mg/dL — ABNORMAL HIGH (ref 8–27)
Bilirubin Total: 0.5 mg/dL (ref 0.0–1.2)
CO2: 21 mmol/L (ref 20–29)
Calcium: 9.3 mg/dL (ref 8.6–10.2)
Chloride: 105 mmol/L (ref 96–106)
Creatinine, Ser: 1.97 mg/dL — ABNORMAL HIGH (ref 0.76–1.27)
GFR calc non Af Amer: 31 mL/min/{1.73_m2} — ABNORMAL LOW (ref >59)
GFR, EST AFRICAN AMERICAN: 35 mL/min/{1.73_m2} — AB (ref >59)
Globulin, Total: 2.1 g/dL (ref 1.5–4.5)
Glucose: 114 mg/dL — ABNORMAL HIGH (ref 65–99)
Potassium: 5 mmol/L (ref 3.5–5.2)
Sodium: 141 mmol/L (ref 134–144)
TOTAL PROTEIN: 6.4 g/dL (ref 6.0–8.5)

## 2018-11-28 LAB — FERRITIN: Ferritin: 98 ng/mL (ref 30–400)

## 2018-11-28 LAB — CBC
Hematocrit: 34 % — ABNORMAL LOW (ref 37.5–51.0)
Hemoglobin: 11.7 g/dL — ABNORMAL LOW (ref 13.0–17.7)
MCH: 30.2 pg (ref 26.6–33.0)
MCHC: 34.4 g/dL (ref 31.5–35.7)
MCV: 88 fL (ref 79–97)
Platelets: 176 10*3/uL (ref 150–450)
RBC: 3.88 x10E6/uL — ABNORMAL LOW (ref 4.14–5.80)
RDW: 13.8 % (ref 11.6–15.4)
WBC: 6.1 10*3/uL (ref 3.4–10.8)

## 2018-11-28 LAB — TSH: TSH: 5.53 u[IU]/mL — ABNORMAL HIGH (ref 0.450–4.500)

## 2018-12-09 ENCOUNTER — Encounter: Payer: Self-pay | Admitting: *Deleted

## 2018-12-09 ENCOUNTER — Other Ambulatory Visit: Payer: Self-pay | Admitting: Internal Medicine

## 2018-12-09 DIAGNOSIS — I714 Abdominal aortic aneurysm, without rupture, unspecified: Secondary | ICD-10-CM

## 2018-12-09 NOTE — Progress Notes (Signed)
A user error has taken place: encounter opened in error, closed for administrative reasons.

## 2018-12-10 ENCOUNTER — Other Ambulatory Visit: Payer: Self-pay | Admitting: Pharmacist

## 2018-12-10 ENCOUNTER — Encounter: Payer: Self-pay | Admitting: Pharmacist

## 2018-12-10 DIAGNOSIS — Z7901 Long term (current) use of anticoagulants: Secondary | ICD-10-CM

## 2018-12-10 DIAGNOSIS — I4891 Unspecified atrial fibrillation: Secondary | ICD-10-CM

## 2018-12-12 ENCOUNTER — Other Ambulatory Visit: Payer: Self-pay | Admitting: *Deleted

## 2018-12-12 DIAGNOSIS — Z7901 Long term (current) use of anticoagulants: Secondary | ICD-10-CM

## 2018-12-12 DIAGNOSIS — I4891 Unspecified atrial fibrillation: Secondary | ICD-10-CM

## 2018-12-15 ENCOUNTER — Ambulatory Visit: Payer: Medicare Other

## 2018-12-18 DIAGNOSIS — I4891 Unspecified atrial fibrillation: Secondary | ICD-10-CM | POA: Diagnosis not present

## 2018-12-18 DIAGNOSIS — Z7901 Long term (current) use of anticoagulants: Secondary | ICD-10-CM | POA: Diagnosis not present

## 2018-12-18 LAB — PROTIME-INR
INR: 1.5 — ABNORMAL HIGH (ref 0.8–1.2)
Prothrombin Time: 15.1 s — ABNORMAL HIGH (ref 9.1–12.0)

## 2018-12-19 ENCOUNTER — Other Ambulatory Visit: Payer: Self-pay | Admitting: *Deleted

## 2018-12-19 ENCOUNTER — Telehealth: Payer: Self-pay | Admitting: Pharmacist

## 2018-12-19 DIAGNOSIS — I4891 Unspecified atrial fibrillation: Secondary | ICD-10-CM

## 2018-12-19 DIAGNOSIS — Z7901 Long term (current) use of anticoagulants: Secondary | ICD-10-CM

## 2018-12-19 NOTE — Telephone Encounter (Signed)
Patient was called and provided his INR results from his venous sampling at [facility] performed on 18-Dec-2018 while on 27mg  warfarin per week. Will increase to 30mg  warfarin per week (one x 5mg  tablet on M/W/F; 1/2 x 5mg  [2.5mg ] all other days. Repeat INR, week of 19-Jan-2019.

## 2018-12-19 NOTE — Telephone Encounter (Signed)
INR = 1.5 on 27mg  warfarin/week. Increased to 30mg  warfarin/week as:  6mg  (whole tablet) M/W/F; 1/2 x 6mg  [3mg  dose] all other days. Repeat INR 19-Jan-2019.

## 2018-12-19 NOTE — Progress Notes (Signed)
Patient was called and provided his INR results from his venous sampling at [facility] performed on 18-Dec-2018 while on 27mg  warfarin per week. Will increase to 30mg  warfarin per week (one x 5mg  tablet on M/W/F; 1/2 x 5mg  [2.5mg ] all other days.

## 2018-12-27 ENCOUNTER — Other Ambulatory Visit: Payer: Self-pay | Admitting: Internal Medicine

## 2018-12-27 DIAGNOSIS — G5693 Unspecified mononeuropathy of bilateral upper limbs: Secondary | ICD-10-CM

## 2018-12-29 NOTE — Telephone Encounter (Signed)
My last note indicated he self D/C'd the gabapentin

## 2018-12-30 ENCOUNTER — Other Ambulatory Visit: Payer: Self-pay | Admitting: *Deleted

## 2018-12-30 DIAGNOSIS — I4891 Unspecified atrial fibrillation: Secondary | ICD-10-CM

## 2018-12-31 MED ORDER — WARFARIN SODIUM 6 MG PO TABS: ORAL_TABLET | ORAL | 3 refills | Status: DC

## 2019-01-26 ENCOUNTER — Encounter: Payer: Self-pay | Admitting: Internal Medicine

## 2019-01-26 ENCOUNTER — Ambulatory Visit (HOSPITAL_COMMUNITY)
Admission: RE | Admit: 2019-01-26 | Discharge: 2019-01-26 | Disposition: A | Discharge status: Home / Self Care | Payer: Medicare Other | Source: Ambulatory Visit | Attending: Internal Medicine | Admitting: Internal Medicine

## 2019-01-26 ENCOUNTER — Other Ambulatory Visit: Payer: Self-pay

## 2019-01-26 DIAGNOSIS — I714 Abdominal aortic aneurysm, without rupture, unspecified: Secondary | ICD-10-CM

## 2019-01-26 DIAGNOSIS — H01026 Squamous blepharitis left eye, unspecified eyelid: Secondary | ICD-10-CM | POA: Diagnosis not present

## 2019-01-26 DIAGNOSIS — H353121 Nonexudative age-related macular degeneration, left eye, early dry stage: Secondary | ICD-10-CM | POA: Diagnosis not present

## 2019-01-26 DIAGNOSIS — H04123 Dry eye syndrome of bilateral lacrimal glands: Secondary | ICD-10-CM | POA: Diagnosis not present

## 2019-01-26 DIAGNOSIS — H401124 Primary open-angle glaucoma, left eye, indeterminate stage: Secondary | ICD-10-CM | POA: Diagnosis not present

## 2019-01-26 DIAGNOSIS — I700 Atherosclerosis of aorta: Secondary | ICD-10-CM | POA: Diagnosis not present

## 2019-01-26 DIAGNOSIS — H01023 Squamous blepharitis right eye, unspecified eyelid: Secondary | ICD-10-CM | POA: Diagnosis not present

## 2019-02-10 ENCOUNTER — Ambulatory Visit (INDEPENDENT_AMBULATORY_CARE_PROVIDER_SITE_OTHER): Payer: Medicare Other | Admitting: Internal Medicine

## 2019-02-10 ENCOUNTER — Other Ambulatory Visit: Payer: Self-pay

## 2019-02-10 ENCOUNTER — Ambulatory Visit (INDEPENDENT_AMBULATORY_CARE_PROVIDER_SITE_OTHER): Payer: Medicare Other | Admitting: Pharmacist

## 2019-02-10 VITALS — BP 116/58 | HR 58 | Temp 98.1°F | Wt 178.0 lb

## 2019-02-10 DIAGNOSIS — Z7901 Long term (current) use of anticoagulants: Secondary | ICD-10-CM

## 2019-02-10 DIAGNOSIS — Z5181 Encounter for therapeutic drug level monitoring: Secondary | ICD-10-CM

## 2019-02-10 DIAGNOSIS — Z79899 Other long term (current) drug therapy: Secondary | ICD-10-CM

## 2019-02-10 DIAGNOSIS — I4891 Unspecified atrial fibrillation: Secondary | ICD-10-CM | POA: Diagnosis not present

## 2019-02-10 DIAGNOSIS — R0982 Postnasal drip: Secondary | ICD-10-CM

## 2019-02-10 DIAGNOSIS — R1310 Dysphagia, unspecified: Secondary | ICD-10-CM

## 2019-02-10 DIAGNOSIS — Z87891 Personal history of nicotine dependence: Secondary | ICD-10-CM | POA: Diagnosis not present

## 2019-02-10 HISTORY — DX: Dysphagia, unspecified: R13.10

## 2019-02-10 LAB — POCT INR: INR: 2.4 (ref 2.0–3.0)

## 2019-02-10 MED ORDER — FLUTICASONE PROPIONATE 50 MCG/ACT NA SUSP: 1.0000 | Freq: Every day | NASAL | 2 refills | Status: AC

## 2019-02-10 NOTE — Progress Notes (Signed)
Anticoagulation Management Andrew [name] is a 85 y.o. male who reports to the clinic for monitoring of warfarin treatment.    Indication: atrial fibrillation   Duration: indefinite Supervising physician: Joni Reining  Anticoagulation Clinic Visit History: Patient does not report signs/symptoms of bleeding or thromboembolism  Other recent changes: No diet, medications, lifestyle reported at this visit.  Anticoagulation Episode Summary    Current INR goal:   2.0-3.0  TTR:   79.2 % (1.6 y)  Next INR check:   03/09/2019  INR from last check:   2.4 (02/10/2019)  Weekly max warfarin dose:     Target end date:     INR check location:     Preferred lab:     Send INR reminders to:   ANTICOAG [facility]   Indications   Atrial fibrillation ([place]) [I48.91]       Comments:           Allergies  Allergen Reactions  . Tegretol [Carbamazepine] Other (See Comments)    Made patient's feet swell  . Depakote [Divalproex Sodium] Rash  . Dilantin [Phenytoin Sodium Extended] Rash  . Phenobarbital Rash   Prior to Admission medications   Medication Sig Start Date End Date Taking? Authorizing Provider  acetaminophen (TYLENOL) 325 MG tablet Take 2 tablets (650 mg total) by mouth every 6 (six) hours as needed for mild pain (or Fever >/= 101). 06/20/17  Yes Welford Roche, MD  Cholecalciferol (VITAMIN D-3) 1000 units CAPS Take 1,000 Units by mouth daily.   Yes [provider]  fluticasone (FLONASE) 50 MCG/ACT nasal spray Place 1 spray into both nostrils daily. 02/10/19 02/10/20 Yes Lorella Nimrod, MD  gabapentin (NEURONTIN) 100 MG capsule TAKE 1 CAPSULE BY MOUTH THREE TIMES A DAY 06/20/18  Yes Bartholomew Crews, MD  hydrocortisone cream 1 % Apply 1 application topically every 4 (four) hours.   Yes [provider]  metoprolol tartrate (LOPRESSOR) 25 MG tablet Take 12.5 mg by mouth at bedtime.    Yes [provider]  Multiple Vitamins-Minerals (ONE-A-DAY MENS 50+ ADVANTAGE) TABS  Take 1 tablet by mouth at bedtime.   Yes [provider]  vitamin B-12 (CYANOCOBALAMIN) 1000 MCG tablet Take 1,000 mcg by mouth daily.   Yes [provider]  warfarin (COUMADIN) 6 MG tablet Take one tablet by mouth once-daily at [facility] on Mondays, Wednesdays, and Fridays. Take one half (1/2) pill all other days. 12/31/18  Yes Bartholomew Crews, MD  zonisamide (ZONEGRAN) 100 MG capsule Take 300 mg by mouth 2 (two) times daily. MORNING and EVENING   Yes [provider]   Past Medical History:  Diagnosis Date  . A-fib ([place])   . Cancer ([place])   . Diabetes mellitus without complication ([place])   . Dysrhythmia   . GI bleed 04/02/2014  . Hypertension   . Seizures ([place])    Social History   Socioeconomic History  . Marital status: Married    Spouse name: Not on file  . Number of children: Not on file  . Years of education: Not on file  . Highest education level: Not on file  Occupational History  . Not on file  Social Needs  . Financial resource strain: Not on file  . Food insecurity:    Worry: Not on file    Inability: Not on file  . Transportation needs:    Medical: Not on file    Non-medical: Not on file  Tobacco Use  . Smoking status: Former Smoker  Last attempt to quit: 09/24/1969    Years since quitting: 49.4  . Smokeless tobacco: Never Used  Substance and Sexual Activity  . Alcohol use: No  . Drug use: No  . Sexual activity: Not on file  Lifestyle  . Physical activity:    Days per week: Not on file    Minutes per session: Not on file  . Stress: Not on file  Relationships  . Social connections:    Talks on phone: Not on file    Gets together: Not on file    Attends religious service: Not on file    Active member of club or organization: Not on file    Attends meetings of clubs or organizations: Not on file    Relationship status: Not on file  Other Topics Concern  . Not on file  Social History Narrative  . Not on file   Family History   Problem Relation Age of Onset  . Diabetes Mellitus II Mother   . Prostate cancer Father   . Diabetes Mellitus II Brother     ASSESSMENT Recent Results: The most recent result is correlated with 27 mg per week: Lab Results  Component Value Date   INR 2.4 02/10/2019   INR 1.5 (H) 12/18/2018   INR 2.2 11/17/2018    Anticoagulation Dosing: Description   Take 1 tablet of your teal 6mg  warfarin tablets by mouth once daily on Mondays and Thursdays of each week, on all other days, take 1/2 tablet by mouth once daily.     INR today: Therapeutic  PLAN Weekly dose was unchanged and will remain on 27mg /wk.   Patient Instructions  Patient instructed to take medications as defined in the Anti-coagulation Track section of this encounter.  Patient instructed to take today's dose.  Patient instructed to take 1 tablet of your teal 6mg  warfarin tablets by mouth once daily on Mondays and Thursdays of each week, on all other days, take 1/2 tablet by mouth once daily. Patient verbalized understanding of these instructions.     Patient advised to contact clinic or seek medical attention if signs/symptoms of bleeding or thromboembolism occur.  Patient verbalized understanding by repeating back information and was advised to contact me if further medication-related questions arise. Patient was also provided an information handout.  Follow-up Return in 27 days (on 03/09/2019) for Follow up INR at 1045h.  Pennie Banter, PharmD, CPP  15 minutes spent face-to-face with the patient during the encounter. 50% of time spent on education, including signs/sx bleeding and clotting, as well as food and drug interactions with warfarin. 50% of time was spent on fingerprick POC INR sample collection,processing, results determination, and documentation in [email or web address]/.

## 2019-02-10 NOTE — Patient Instructions (Signed)
Patient instructed to take medications as defined in the Anti-coagulation Track section of this encounter.  Patient instructed to take today's dose.  Patient instructed to take 1 tablet of your teal 6mg  warfarin tablets by mouth once daily on Mondays and Thursdays of each week, on all other days, take 1/2 tablet by mouth once daily. Patient verbalized understanding of these instructions.

## 2019-02-10 NOTE — Assessment & Plan Note (Signed)
Patient was in sinus rhythm today. He was due for his INR checked as it was subtherapeutic 1 month ago.  INR was 2.4-therapeutic today.  -Continue with Coumadin and metoprolol.

## 2019-02-10 NOTE — Assessment & Plan Note (Signed)
Patient was having very nonspecific symptoms of dysphagia. No obvious abnormality. Might believe mild postnasal drip is playing some role.  -Give him a trial of Flonase. -Refer him to see a gastroenterologist for further evaluation.

## 2019-02-10 NOTE — Patient Instructions (Signed)
Thank you for visiting clinic today. As we discussed we will try her doing some gargles and Flonase for your sinus drainage. I am also referring you to see a gastroenterologist due to your difficulty swallowing.  You have seen Dr. Deatra Ina [facility] in the past, you can call his office and make an appointment. If your symptoms continue to get worse please call our clinic.

## 2019-02-10 NOTE — Progress Notes (Signed)
   CC: Something stuck in throat for 1 week.  HPI:  [name] is a 85 y.o. with past medical history as listed below came to the clinic with complaint of something stuck in his throat for 1 week.  He denies any inciting factor.  He denies any sore throat, cough or fever.  Denies any sick contacts or travel. Per patient he occasionally having difficulty with swallowing and feeling that there is something stuck in his throat.  At time he was able to swallow without any difficulty.  There is no difference between solid or liquid food.  He quit smoking 46 years ago. He do have mild sinus congestion with postnasal drip.  He was also complaining of some worsening of pain at the base of left thumb, which he attribute might be an exacerbation of his arthritis.  Please see assessment and plan for his chronic condition.  Past Medical History:  Diagnosis Date  . A-fib ([place])   . Cancer ([place])   . Diabetes mellitus without complication ([place])   . Dysrhythmia   . GI bleed 04/02/2014  . Hypertension   . Seizures ([place])    Review of Systems: Negative except mentioned in HPI.  Physical Exam:  Vitals:   02/10/19 1025  BP: (!) 116/58  Pulse: (!) 58  Temp: 98.1 F (36.7 C)  TempSrc: Oral  SpO2: 100%  Weight: 178 lb (80.7 kg)   Vitals:   02/10/19 1025  BP: (!) 116/58  Pulse: (!) 58  Temp: 98.1 F (36.7 C)  TempSrc: Oral  SpO2: 100%  Weight: 178 lb (80.7 kg)   General: Vital signs reviewed.  Patient is well-developed and well-nourished, in no acute distress and cooperative with exam.  HEENT: Normocephalic and atraumatic.  No edema or erythema or any obvious pharyngeal abnormality.  No postnasal discharge noted. Cardiovascular: RRR, S1 normal, S2 normal, no murmurs, gallops, or rubs. Pulmonary/Chest: Clear to auscultation bilaterally, no wheezes, rales, or rhonchi. Abdominal: Soft, non-tender, non-distended, BS +, Musculoskeletal: Tenderness with restricted range of motion at the base  of left thumb. Extremities: No lower extremity edema bilaterally,  pulses symmetric and intact bilaterally. No cyanosis or clubbing. Skin: Warm, dry and intact. No rashes or erythema. Psychiatric: Normal mood and affect. speech and behavior is normal. Cognition and memory are normal.  Assessment & Plan:   See Encounters Tab for problem based charting.  Patient discussed with Dr. Angelia Mould.

## 2019-02-11 ENCOUNTER — Encounter: Payer: Self-pay | Admitting: *Deleted

## 2019-02-11 NOTE — Progress Notes (Signed)
Internal Medicine Clinic Attending  Case discussed with Dr. Amin at the time of the visit.  We reviewed the resident's history and exam and pertinent patient test results.  I agree with the assessment, diagnosis, and plan of care documented in the resident's note.    

## 2019-02-17 ENCOUNTER — Encounter: Payer: Self-pay | Admitting: *Deleted

## 2019-02-18 ENCOUNTER — Encounter: Payer: Self-pay | Admitting: *Deleted

## 2019-03-03 ENCOUNTER — Other Ambulatory Visit: Payer: Self-pay | Admitting: Pharmacist

## 2019-03-03 DIAGNOSIS — I4891 Unspecified atrial fibrillation: Secondary | ICD-10-CM

## 2019-03-09 ENCOUNTER — Ambulatory Visit (INDEPENDENT_AMBULATORY_CARE_PROVIDER_SITE_OTHER): Payer: Medicare Other | Admitting: Pharmacist

## 2019-03-09 ENCOUNTER — Other Ambulatory Visit: Payer: Self-pay

## 2019-03-09 DIAGNOSIS — I4891 Unspecified atrial fibrillation: Secondary | ICD-10-CM

## 2019-03-09 DIAGNOSIS — Z7901 Long term (current) use of anticoagulants: Secondary | ICD-10-CM | POA: Diagnosis not present

## 2019-03-09 DIAGNOSIS — Z5181 Encounter for therapeutic drug level monitoring: Secondary | ICD-10-CM

## 2019-03-09 LAB — POCT INR: INR: 2.7 (ref 2.0–3.0)

## 2019-03-09 NOTE — Progress Notes (Signed)
Anticoagulation Management

## 2019-03-09 NOTE — Patient Instructions (Signed)
Patient instructed to take medications as defined in the Anti-coagulation Track section of this encounter.  Patient instructed to take today's dose.  Patient instructed to take  1 tablet of your teal 6mg  warfarin tablets by mouth once daily on Mondays, Wednesdays and Fridays of each week, on all other days, take 1/2 tablet by mouth once daily. Patient verbalized understanding of these instructions.

## 2019-03-09 NOTE — Progress Notes (Signed)
Anticoagulation Management Andrew [name] is a 85 y.o. male who reports to the clinic for monitoring of warfarin treatment.    Indication: atrial fibrillation   Duration: indefinite Supervising physician: Joni Reining  Anticoagulation Clinic Visit History: Patient does not report signs/symptoms of bleeding or thromboembolism  Other recent changes: No diet, medications, lifestyle changes.  Anticoagulation Episode Summary    Current INR goal:  2.0-3.0  TTR:  80.1 % (1.6 y)  Next INR check:  04/06/2019  INR from last check:  2.7 (03/09/2019)  Weekly max warfarin dose:    Target end date:    INR check location:    Preferred lab:    Send INR reminders to:  ANTICOAG IMP   Indications   Atrial fibrillation ([place]) [I48.91]       Comments:          Allergies  Allergen Reactions  . Tegretol [Carbamazepine] Other (See Comments)    Made patient's feet swell  . Depakote [Divalproex Sodium] Rash  . Dilantin [Phenytoin Sodium Extended] Rash  . Phenobarbital Rash    Current Outpatient Medications:  .  acetaminophen (TYLENOL) 325 MG tablet, Take 2 tablets (650 mg total) by mouth every 6 (six) hours as needed for mild pain (or Fever >/= 101)., Disp: 30 tablet, Rfl: 0 .  Cholecalciferol (VITAMIN D-3) 1000 units CAPS, Take 1,000 Units by mouth daily., Disp: , Rfl:  .  fluticasone (FLONASE) 50 MCG/ACT nasal spray, Place 1 spray into both nostrils daily., Disp: 16 g, Rfl: 2 .  gabapentin (NEURONTIN) 100 MG capsule, TAKE 1 CAPSULE BY MOUTH THREE TIMES A DAY, Disp: 270 capsule, Rfl: 1 .  hydrocortisone cream 1 %, Apply 1 application topically every 4 (four) hours., Disp: , Rfl:  .  metoprolol tartrate (LOPRESSOR) 25 MG tablet, Take 12.5 mg by mouth at bedtime. , Disp: , Rfl:  .  Multiple Vitamins-Minerals (ONE-A-DAY MENS 50+ ADVANTAGE) TABS, Take 1 tablet by mouth at bedtime., Disp: , Rfl:  .  vitamin B-12 (CYANOCOBALAMIN) 1000 MCG tablet, Take 1,000 mcg by mouth daily., Disp: , Rfl:  .  warfarin  (COUMADIN) 6 MG tablet, Take one 6 mg pill (6 mg x 1) every Mon, Thu; take 3 mg of 1/2 pill (6 mg x 0.5) all other days, Disp: 48 tablet, Rfl: 1 .  zonisamide (ZONEGRAN) 100 MG capsule, Take 300 mg by mouth 2 (two) times daily. MORNING and EVENING, Disp: , Rfl:  Past Medical History:  Diagnosis Date  . A-fib ([place])   . Cancer ([place])   . Diabetes mellitus without complication ([place])   . Dysrhythmia   . GI bleed 04/02/2014  . Hypertension   . Seizures ([place])    Social History   Socioeconomic History  . Marital status: Married    Spouse name: Not on file  . Number of children: Not on file  . Years of education: Not on file  . Highest education level: Not on file  Occupational History  . Not on file  Social Needs  . Financial resource strain: Not on file  . Food insecurity    Worry: Not on file    Inability: Not on file  . Transportation needs    Medical: Not on file    Non-medical: Not on file  Tobacco Use  . Smoking status: Former Smoker    Quit date: 09/24/1969    Years since quitting: 49.4  . Smokeless tobacco: Never Used  Substance and Sexual Activity  . Alcohol use: No  .  Drug use: No  . Sexual activity: Not on file  Lifestyle  . Physical activity    Days per week: Not on file    Minutes per session: Not on file  . Stress: Not on file  Relationships  . Social Herbalist on phone: Not on file    Gets together: Not on file    Attends religious service: Not on file    Active member of club or organization: Not on file    Attends meetings of clubs or organizations: Not on file    Relationship status: Not on file  Other Topics Concern  . Not on file  Social History Narrative  . Not on file   Family History  Problem Relation Age of Onset  . Diabetes Mellitus II Mother   . Prostate cancer Father   . Diabetes Mellitus II Brother     ASSESSMENT Recent Results: The most recent result is correlated with 30 mg per week: Lab Results  Component Value Date    INR 2.7 03/09/2019   INR 2.4 02/10/2019   INR 1.5 (H) 12/18/2018    Anticoagulation Dosing: Description   Take 1 tablet of your teal 6mg  warfarin tablets by mouth once daily on Mondays, Wednesdays and Fridays of each week, on all other days, take 1/2 tablet by mouth once daily.     INR today: Therapeutic  PLAN Weekly dose was unchanged. Will continue 30mg  warfarin/wk. As:  6mg  M/W/F; 3mg  all other days.  Patient Instructions  Patient instructed to take medications as defined in the Anti-coagulation Track section of this encounter.  Patient instructed to take today's dose.  Patient instructed to take  1 tablet of your teal 6mg  warfarin tablets by mouth once daily on Mondays, Wednesdays and Fridays of each week, on all other days, take 1/2 tablet by mouth once daily. Patient verbalized understanding of these instructions.     Patient advised to contact clinic or seek medical attention if signs/symptoms of bleeding or thromboembolism occur.  Patient verbalized understanding by repeating back information and was advised to contact me if further medication-related questions arise. Patient was also provided an information handout.  Follow-up Return in 4 weeks (on 04/06/2019) for Follow up INR at 10:30 AM with Dr. Elie Confer.  Pennie Banter, PharmD, CPP  15 minutes spent face-to-face with the patient during the encounter. 50% of time spent on education, including signs/sx bleeding and clotting, as well as food and drug interactions with warfarin. 50% of time was spent on fingerprick POC INR sample collection,processing, results determination, and documentation in [email or web address]/.

## 2019-03-19 NOTE — Addendum Note (Signed)
Addended by: Hulan Fray on: 03/19/2019 06:18 PM   Modules accepted: Orders

## 2019-03-31 DIAGNOSIS — H0102A Squamous blepharitis right eye, upper and lower eyelids: Secondary | ICD-10-CM | POA: Diagnosis not present

## 2019-03-31 DIAGNOSIS — H353121 Nonexudative age-related macular degeneration, left eye, early dry stage: Secondary | ICD-10-CM | POA: Diagnosis not present

## 2019-03-31 DIAGNOSIS — E119 Type 2 diabetes mellitus without complications: Secondary | ICD-10-CM | POA: Diagnosis not present

## 2019-03-31 DIAGNOSIS — H401124 Primary open-angle glaucoma, left eye, indeterminate stage: Secondary | ICD-10-CM | POA: Diagnosis not present

## 2019-03-31 DIAGNOSIS — H04123 Dry eye syndrome of bilateral lacrimal glands: Secondary | ICD-10-CM | POA: Diagnosis not present

## 2019-04-06 ENCOUNTER — Other Ambulatory Visit: Payer: Self-pay

## 2019-04-06 ENCOUNTER — Ambulatory Visit (INDEPENDENT_AMBULATORY_CARE_PROVIDER_SITE_OTHER): Payer: Medicare Other | Admitting: Pharmacist

## 2019-04-06 DIAGNOSIS — Z7901 Long term (current) use of anticoagulants: Secondary | ICD-10-CM | POA: Diagnosis not present

## 2019-04-06 DIAGNOSIS — I4891 Unspecified atrial fibrillation: Secondary | ICD-10-CM | POA: Diagnosis not present

## 2019-04-06 DIAGNOSIS — Z5181 Encounter for therapeutic drug level monitoring: Secondary | ICD-10-CM

## 2019-04-06 LAB — POCT INR: INR: 2.9 (ref 2.0–3.0)

## 2019-04-06 NOTE — Patient Instructions (Signed)
Patient instructed to take medications as defined in the Anti-coagulation Track section of this encounter.  Patient instructed to take today's dose.  Patient instructed to take 1 tablet of your teal 6mg  warfarin tablets by mouth once daily on Tuesdays and Fridays of each week, on all other days, take 1/2 tablet by mouth once daily. Patient verbalized understanding of these instructions.

## 2019-04-06 NOTE — Progress Notes (Signed)
Anticoagulation Management Andrew [name] is a 85 y.o. male who reports to the clinic for monitoring of warfarin treatment.    Indication: atrial fibrillation; Long term current use of anticoagulant.  Duration: indefinite Supervising physician: Aldine Contes  Anticoagulation Clinic Visit History: Patient does not report signs/symptoms of bleeding or thromboembolism  Other recent changes: No diet, medications, lifestyle changes.  Anticoagulation Episode Summary    Current INR goal:  2.0-3.0  TTR:  81.0 % (1.7 y)  Next INR check:  05/14/2019  INR from last check:    Weekly max warfarin dose:    Target end date:    INR check location:    Preferred lab:    Send INR reminders to:  ANTICOAG IMP   Indications   Atrial fibrillation ([place]) [I48.91]       Comments:          Allergies  Allergen Reactions  . Tegretol [Carbamazepine] Other (See Comments)    Made patient's feet swell  . Depakote [Divalproex Sodium] Rash  . Dilantin [Phenytoin Sodium Extended] Rash  . Phenobarbital Rash    Current Outpatient Medications:  .  acetaminophen (TYLENOL) 325 MG tablet, Take 2 tablets (650 mg total) by mouth every 6 (six) hours as needed for mild pain (or Fever >/= 101)., Disp: 30 tablet, Rfl: 0 .  Cholecalciferol (VITAMIN D-3) 1000 units CAPS, Take 1,000 Units by mouth daily., Disp: , Rfl:  .  fluticasone (FLONASE) 50 MCG/ACT nasal spray, Place 1 spray into both nostrils daily., Disp: 16 g, Rfl: 2 .  gabapentin (NEURONTIN) 100 MG capsule, TAKE 1 CAPSULE BY MOUTH THREE TIMES A DAY, Disp: 270 capsule, Rfl: 1 .  hydrocortisone cream 1 %, Apply 1 application topically every 4 (four) hours., Disp: , Rfl:  .  metoprolol tartrate (LOPRESSOR) 25 MG tablet, Take 12.5 mg by mouth at bedtime. , Disp: , Rfl:  .  Multiple Vitamins-Minerals (ONE-A-DAY MENS 50+ ADVANTAGE) TABS, Take 1 tablet by mouth at bedtime., Disp: , Rfl:  .  vitamin B-12 (CYANOCOBALAMIN) 1000 MCG tablet, Take 1,000 mcg by mouth  daily., Disp: , Rfl:  .  warfarin (COUMADIN) 6 MG tablet, Take one 6 mg pill (6 mg x 1) every Mon, Thu; take 3 mg of 1/2 pill (6 mg x 0.5) all other days, Disp: 48 tablet, Rfl: 1 .  zonisamide (ZONEGRAN) 100 MG capsule, Take 300 mg by mouth 2 (two) times daily. MORNING and EVENING, Disp: , Rfl:  Past Medical History:  Diagnosis Date  . A-fib ([place])   . Cancer ([place])   . Diabetes mellitus without complication ([place])   . Dysrhythmia   . GI bleed 04/02/2014  . Hypertension   . Seizures ([place])    Social History   Socioeconomic History  . Marital status: Married    Spouse name: Not on file  . Number of children: Not on file  . Years of education: Not on file  . Highest education level: Not on file  Occupational History  . Not on file  Social Needs  . Financial resource strain: Not on file  . Food insecurity    Worry: Not on file    Inability: Not on file  . Transportation needs    Medical: Not on file    Non-medical: Not on file  Tobacco Use  . Smoking status: Former Smoker    Quit date: 09/24/1969    Years since quitting: 49.5  . Smokeless tobacco: Never Used  Substance and Sexual Activity  . Alcohol  use: No  . Drug use: No  . Sexual activity: Not on file  Lifestyle  . Physical activity    Days per week: Not on file    Minutes per session: Not on file  . Stress: Not on file  Relationships  . Social Herbalist on phone: Not on file    Gets together: Not on file    Attends religious service: Not on file    Active member of club or organization: Not on file    Attends meetings of clubs or organizations: Not on file    Relationship status: Not on file  Other Topics Concern  . Not on file  Social History Narrative  . Not on file   Family History  Problem Relation Age of Onset  . Diabetes Mellitus II Mother   . Prostate cancer Father   . Diabetes Mellitus II Brother     ASSESSMENT Recent Results: The most recent result is correlated with 30 mg per  week: Lab Results  Component Value Date   INR 2.9 04/06/2019   INR 2.7 03/09/2019   INR 2.4 02/10/2019    Anticoagulation Dosing: Description   Take 1 tablet of your teal 6mg  warfarin tablets by mouth once daily on Tuesdays and Fridays of each week, on all other days, take 1/2 tablet by mouth once daily.     INR today: Therapeutic  PLAN Weekly dose was decreased by 10% to 27 mg per week  Patient Instructions  Patient instructed to take medications as defined in the Anti-coagulation Track section of this encounter.  Patient instructed to take today's dose.  Patient instructed to take 1 tablet of your teal 6mg  warfarin tablets by mouth once daily on Tuesdays and Fridays of each week, on all other days, take 1/2 tablet by mouth once daily. Patient verbalized understanding of these instructions.     Patient advised to contact clinic or seek medical attention if signs/symptoms of bleeding or thromboembolism occur.  Patient verbalized understanding by repeating back information and was advised to contact me if further medication-related questions arise. Patient was also provided an information handout.  Follow-up Return in 5 weeks (on 05/14/2019) for Follow up INR.  Pennie Banter, PharmD, CPP  15 minutes spent face-to-face with the patient during the encounter. 50% of time spent on education, including signs/sx bleeding and clotting, as well as food and drug interactions with warfarin. 50% of time was spent on fingerprick POC INR sample collection,processing, results determination, and documentation in [email or web address].[email or web address]/.

## 2019-04-08 NOTE — Progress Notes (Signed)
INTERNAL MEDICINE TEACHING ATTENDING ADDENDUM - Nessie Nong M.D  Duration- indefinite, Indication- afib, INR- therapeutic. Agree with pharmacy recommendations as outlined in their note.     

## 2019-04-14 ENCOUNTER — Other Ambulatory Visit: Payer: Self-pay | Admitting: Internal Medicine

## 2019-04-14 ENCOUNTER — Encounter: Payer: Self-pay | Admitting: Internal Medicine

## 2019-04-14 ENCOUNTER — Ambulatory Visit (HOSPITAL_COMMUNITY)
Admission: RE | Admit: 2019-04-14 | Discharge: 2019-04-14 | Disposition: A | Discharge status: Home / Self Care | Payer: Medicare Other | Source: Ambulatory Visit | Attending: Internal Medicine | Admitting: Internal Medicine

## 2019-04-14 ENCOUNTER — Ambulatory Visit (INDEPENDENT_AMBULATORY_CARE_PROVIDER_SITE_OTHER): Payer: Medicare Other | Admitting: Internal Medicine

## 2019-04-14 ENCOUNTER — Other Ambulatory Visit: Payer: Self-pay

## 2019-04-14 VITALS — BP 108/60 | HR 56 | Ht 70.0 in | Wt 169.3 lb

## 2019-04-14 DIAGNOSIS — I4891 Unspecified atrial fibrillation: Secondary | ICD-10-CM

## 2019-04-14 DIAGNOSIS — I719 Aortic aneurysm of unspecified site, without rupture: Secondary | ICD-10-CM | POA: Diagnosis not present

## 2019-04-14 DIAGNOSIS — W19XXXA Unspecified fall, initial encounter: Secondary | ICD-10-CM

## 2019-04-14 DIAGNOSIS — Z7901 Long term (current) use of anticoagulants: Secondary | ICD-10-CM

## 2019-04-14 DIAGNOSIS — N183 Chronic kidney disease, stage 3 (moderate): Secondary | ICD-10-CM | POA: Diagnosis not present

## 2019-04-14 DIAGNOSIS — M25552 Pain in left hip: Secondary | ICD-10-CM | POA: Insufficient documentation

## 2019-04-14 DIAGNOSIS — M25862 Other specified joint disorders, left knee: Secondary | ICD-10-CM | POA: Diagnosis not present

## 2019-04-14 DIAGNOSIS — G8921 Chronic pain due to trauma: Secondary | ICD-10-CM | POA: Diagnosis not present

## 2019-04-14 DIAGNOSIS — M160 Bilateral primary osteoarthritis of hip: Secondary | ICD-10-CM

## 2019-04-14 HISTORY — DX: Pain in left hip: M25.552

## 2019-04-14 NOTE — Assessment & Plan Note (Addendum)
85 year old male presenting to the internal medicine clinic following a fall that occurred 6 months ago.  Since that time he has been experiencing worsening left hip pain.  Patient is able to bear weight on the hip.    Plan: We will obtain an x-ray of the hip.  Further management be guided by those results.  Should x-ray reveal fractures or severe osteoarthritis, will likely send him to orthopedics.  I also encouraged him to use heat, ice, Tylenol.  He is currently anticoagulated for atrial fibrillation on warfarin which I discouraged the use of ibuprofen.

## 2019-04-14 NOTE — Progress Notes (Signed)
   CC: left hip pain  HPI:  [name] is a 85 y.o. male with past medical history significant for abdominal aortic aneurysm, atrial fibrillation being treated with warfarin, CKD 3.  Patient presents to the office today for left hip pain.  He is accompanied by his daughter.  Notes the left hip pain began following a fall that occurred about 6 months ago.  He did not present for evaluation at the time of the fall.  The pain is located in his lower left buttock and groin area.  He was hoping that the pain would improve over time however the pain is actually worsened a little bit.  He notes that it is really inhibiting his ability to be able to get up and walk around.  He feels like there is bone-on-bone rubbing.  He also endorses a grinding sound when he moves his hip.  He denies any history of any pain in that hip prior to this fall.  The pain is significant enough that the patient was considering going to the ER for this pain.  He has been trying Tylenol, heat, ice without relief.  He denies any urinary or bowel issues.  Past Medical History:  Diagnosis Date  . A-fib ([place])   . Cancer ([place])   . Diabetes mellitus without complication ([place])   . Dysrhythmia   . GI bleed 04/02/2014  . Hypertension   . Seizures ([place])    Review of Systems:  negative other than those stated in HPI  Physical Exam:  Vitals:   04/14/19 1000  BP: 108/60  Pulse: (!) 56  SpO2: 100%  Weight: 169 lb 4.8 oz (76.8 kg)  Height: 5\' 10"  (1.778 m)    GENERAL: well appearing, in no apparent distress CARDIAC: Bradycardia and regular rhythm, no peripheral edema appreciated PULMONARY: lung sounds clear to auscultation SKIN: No hematoma over the affected area. Musculoskeletal: No pain upon palpation of the left hip.  Crepitus noted with hip movement.  Patient is able to bear weight on the hip.  Pain with external rotation that is primarily located in his groin and pelvic area.   Assessment & Plan:   See Encounters  Tab for problem based charting.  Pertinent labs & imaging results that were available during my care of the patient were reviewed by me and considered in my medical decision making  Patient is in agreement with the plan and endorses no further questions at this time.  Patient seen with Dr. Clista Bernhardt, MD Internal Medicine Resident-PGY1 04/14/19

## 2019-04-14 NOTE — Patient Instructions (Signed)
We will get an xray of your left hip. When the resutls are back, we will give you a call and discuss further plans. Try applying heat and/or ice to your hip. You may also use tylenol for pain.

## 2019-04-17 ENCOUNTER — Other Ambulatory Visit: Payer: Self-pay | Admitting: Internal Medicine

## 2019-04-17 DIAGNOSIS — W19XXXA Unspecified fall, initial encounter: Secondary | ICD-10-CM

## 2019-04-17 NOTE — Progress Notes (Signed)
Internal Medicine Clinic Attending  I saw and evaluated the patient.  I personally confirmed the key portions of the history and exam documented by Dr. Christian and I reviewed pertinent patient test results.  The assessment, diagnosis, and plan were formulated together and I agree with the documentation in the resident's note.  Alexander Raines, M.D., Ph.D.  

## 2019-04-17 NOTE — Progress Notes (Unsigned)
Spoke with Andrew [name] regarding his left hip pain. I have been conversing with Dr. Lynnae January regarding this issue as well since she is his PCP. We had considered sending him to orthopedics for a possible injection however given his current anticoagulation, we will trial physical therapy at this time. Hip xray revealed some moderate osteoarthritis. I called patient and relayed this to him. He is in agreement with this plan and has no further questions at this time. I encouraged him to revisit our clinic if he feels that his pain is uncontrolled and needs further management from that standpoint.

## 2019-04-28 ENCOUNTER — Encounter: Payer: Self-pay | Admitting: *Deleted

## 2019-04-30 ENCOUNTER — Other Ambulatory Visit: Payer: Self-pay | Admitting: Internal Medicine

## 2019-04-30 DIAGNOSIS — I4891 Unspecified atrial fibrillation: Secondary | ICD-10-CM

## 2019-04-30 NOTE — Telephone Encounter (Signed)
6 mg (6 mg x 1) every Tue, Fri; 3 mg (6 mg x 0.5) all other days

## 2019-04-30 NOTE — Telephone Encounter (Signed)
Next appt scheduled 8/20 with PCP and coumadin clinic.

## 2019-05-04 ENCOUNTER — Other Ambulatory Visit: Payer: Self-pay | Admitting: Internal Medicine

## 2019-05-04 DIAGNOSIS — I4891 Unspecified atrial fibrillation: Secondary | ICD-10-CM

## 2019-05-04 NOTE — Telephone Encounter (Signed)
I did this on 8/6 to same pharmacy and normal not print

## 2019-05-14 ENCOUNTER — Ambulatory Visit: Payer: Medicare Other | Admitting: Pharmacist

## 2019-05-14 ENCOUNTER — Ambulatory Visit (INDEPENDENT_AMBULATORY_CARE_PROVIDER_SITE_OTHER): Payer: Medicare Other | Admitting: Internal Medicine

## 2019-05-14 VITALS — BP 125/59 | HR 73 | Temp 97.8°F | Wt 166.0 lb

## 2019-05-14 DIAGNOSIS — I4891 Unspecified atrial fibrillation: Secondary | ICD-10-CM

## 2019-05-14 DIAGNOSIS — G629 Polyneuropathy, unspecified: Secondary | ICD-10-CM | POA: Diagnosis not present

## 2019-05-14 DIAGNOSIS — R600 Localized edema: Secondary | ICD-10-CM

## 2019-05-14 DIAGNOSIS — Z85038 Personal history of other malignant neoplasm of large intestine: Secondary | ICD-10-CM

## 2019-05-14 DIAGNOSIS — G8929 Other chronic pain: Secondary | ICD-10-CM

## 2019-05-14 DIAGNOSIS — R7989 Other specified abnormal findings of blood chemistry: Secondary | ICD-10-CM

## 2019-05-14 DIAGNOSIS — Z7901 Long term (current) use of anticoagulants: Secondary | ICD-10-CM

## 2019-05-14 DIAGNOSIS — M5417 Radiculopathy, lumbosacral region: Secondary | ICD-10-CM | POA: Diagnosis not present

## 2019-05-14 DIAGNOSIS — I714 Abdominal aortic aneurysm, without rupture, unspecified: Secondary | ICD-10-CM

## 2019-05-14 DIAGNOSIS — R1310 Dysphagia, unspecified: Secondary | ICD-10-CM

## 2019-05-14 DIAGNOSIS — R4702 Dysphasia: Secondary | ICD-10-CM

## 2019-05-14 DIAGNOSIS — Z79899 Other long term (current) drug therapy: Secondary | ICD-10-CM

## 2019-05-14 DIAGNOSIS — I5020 Unspecified systolic (congestive) heart failure: Secondary | ICD-10-CM | POA: Insufficient documentation

## 2019-05-14 DIAGNOSIS — G5600 Carpal tunnel syndrome, unspecified upper limb: Secondary | ICD-10-CM

## 2019-05-14 DIAGNOSIS — R609 Edema, unspecified: Secondary | ICD-10-CM | POA: Diagnosis not present

## 2019-05-14 DIAGNOSIS — D649 Anemia, unspecified: Secondary | ICD-10-CM

## 2019-05-14 DIAGNOSIS — R569 Unspecified convulsions: Secondary | ICD-10-CM

## 2019-05-14 HISTORY — DX: Unspecified systolic (congestive) heart failure: I50.20

## 2019-05-14 LAB — POCT INR: INR: 2.3 (ref 2.0–3.0)

## 2019-05-14 NOTE — Patient Instructions (Signed)
Patient instructed to take medications as defined in the Anti-coagulation Track section of this encounter.  Patient instructed to take today's dose.  Patient instructed to take 1 tablet of your teal 6mg warfarin tablets by mouth once daily on Tuesdays and Fridays of each week, on all other days, take 1/2 tablet by mouth once daily. Patient verbalized understanding of these instructions.    

## 2019-05-14 NOTE — Assessment & Plan Note (Signed)
This problem is minimal.  For the past several months, his neuropathy and walking have gotten worse.  For his distal symmetric neuropathy, he has been labeled diabetic but all of his A1c's have been normal.  His vitamin B12 has been normal.  I have tried him on gabapentin without success.  He also has carpal tunnel syndrome which is stable.  He also has Right lumbosacral radiculopathy L4 L5-S1 diagnosed on nerve conduction studies and an MRI was ordered due to that, gait abnormality already, and urinary issues but the MRI has not been completed.  I talked to the patient and Ulis Rias to go ahead and get this scheduled.  His walking has also gotten worse.  He has to rock several times and uses hands to stand up.  He also has to hold onto something in order to walk.  He has had no falls other than the one in January.  Both he and his daughter state that there is some hesitancy in his movements.  His exam showed overall normal lower extremity strength with some left mild distal weakness.  I think this is multifactorial including decreased sensation in his feet reducing proprioception, mild weakness, and pain.  I discussed that I strongly recommend neuro PT follow look for any reversible causes.  He is hesitant to accept PT referral as he does not think what they are going to offer.  PLAN : Neuro PT

## 2019-05-14 NOTE — Assessment & Plan Note (Signed)
This problem is chronic and stable.  He is on Zonegran and states his last seizure was 6 to 7 months ago.  He and his daughter state he does not have a typical seizure pattern and that they are all different.  He has a neurologist at the [place] and I do not have access to any of those records.  He is no longer driving as his daughter is doing the driving.  PLAN : Daughter states will bring in [place] records

## 2019-05-14 NOTE — Assessment & Plan Note (Addendum)
This problem is chronic and stable.  His most recent hemoglobin was in March 2020 and was 11.7.  Previously, it has been stable at about 9 for the past 4 years.  He has a remote history of colon cancer but had a colonoscopy in 2015 with a 7-year recall.  I checked his ferritin in March and it was 73 which is reassuring.  PLAN : Daughter will bring in records from the [place]

## 2019-05-14 NOTE — Progress Notes (Signed)
Anticoagulation Management Andrew [name] is a 85 y.o. male who reports to the clinic for monitoring of warfarin treatment.    Indication: atrial fibrillation; long term current use of anticoagulation.  Duration: indefinite Supervising physician: Andrew [name] Clinic Visit History: Patient does not report signs/symptoms of bleeding or thromboembolism  Other recent changes: No diet, medications, lifestyle changes Anticoagulation Episode Summary    Current INR goal:  2.0-3.0  TTR:  82.1 % (1.8 y)  Next INR check:  06/08/2019  INR from last check:  2.3 (05/14/2019)  Weekly max warfarin dose:    Target end date:    INR check location:    Preferred lab:    Send INR reminders to:  [facility]   Indications   Atrial fibrillation ([place]) [I48.91]       Comments:          Allergies  Allergen Reactions  . Tegretol [Carbamazepine] Other (See Comments)    Made patient's feet swell  . Depakote [Divalproex Sodium] Rash  . Dilantin [Phenytoin Sodium Extended] Rash  . Phenobarbital Rash    Current Outpatient Medications:  .  acetaminophen (TYLENOL) 325 MG tablet, Take 2 tablets (650 mg total) by mouth every 6 (six) hours as needed for mild pain (or Fever >/= 101)., Disp: 30 tablet, Rfl: 0 .  Cholecalciferol (VITAMIN D-3) 1000 units CAPS, Take 1,000 Units by mouth daily., Disp: , Rfl:  .  fluticasone (FLONASE) 50 MCG/ACT nasal spray, Place 1 spray into both nostrils daily., Disp: 16 g, Rfl: 2 .  gabapentin (NEURONTIN) 100 MG capsule, TAKE 1 CAPSULE BY MOUTH THREE TIMES A DAY, Disp: 270 capsule, Rfl: 1 .  hydrocortisone cream 1 %, Apply 1 application topically every 4 (four) hours., Disp: , Rfl:  .  metoprolol tartrate (LOPRESSOR) 25 MG tablet, Take 12.5 mg by mouth at bedtime. , Disp: , Rfl:  .  Multiple Vitamins-Minerals (ONE-A-DAY MENS 50+ ADVANTAGE) TABS, Take 1 tablet by mouth at bedtime., Disp: , Rfl:  .  vitamin B-12 (CYANOCOBALAMIN) 1000 MCG tablet, Take 1,000  mcg by mouth daily., Disp: , Rfl:  .  warfarin (COUMADIN) 6 MG tablet, TAKE 1 TAB ONCE-DAILY AT 6PM ON TUESDAYS AND FRIDAYS. TAKE 1/2 PILL ALL OTHER DAYS., Disp: 60 tablet, Rfl: 1 .  zonisamide (ZONEGRAN) 100 MG capsule, Take 300 mg by mouth 2 (two) times daily. MORNING and EVENING, Disp: , Rfl:  Past Medical History:  Diagnosis Date  . A-fib ([place])   . Cancer ([place])   . Diabetes mellitus without complication ([place])   . Dysrhythmia   . GI bleed 04/02/2014  . Hypertension   . Seizures ([place])    Social History   Socioeconomic History  . Marital status: Married    Spouse name: Not on file  . Number of children: Not on file  . Years of education: Not on file  . Highest education level: Not on file  Occupational History  . Not on file  Social Needs  . Financial resource strain: Not on file  . Food insecurity    Worry: Not on file    Inability: Not on file  . Transportation needs    Medical: Not on file    Non-medical: Not on file  Tobacco Use  . Smoking status: Former Smoker    Quit date: 09/24/1969    Years since quitting: 49.6  . Smokeless tobacco: Never Used  Substance and Sexual Activity  . Alcohol use: No  . Drug use: No  .  Sexual activity: Not on file  Lifestyle  . Physical activity    Days per week: Not on file    Minutes per session: Not on file  . Stress: Not on file  Relationships  . Social Herbalist on phone: Not on file    Gets together: Not on file    Attends religious service: Not on file    Active member of club or organization: Not on file    Attends meetings of clubs or organizations: Not on file    Relationship status: Not on file  Other Topics Concern  . Not on file  Social History Narrative  . Not on file   Family History  Problem Relation Age of Onset  . Diabetes Mellitus II Mother   . Prostate cancer Father   . Diabetes Mellitus II Brother     ASSESSMENT Recent Results: The most recent result is correlated with 27 mg per  week: Lab Results  Component Value Date   INR 2.3 05/14/2019   INR 2.9 04/06/2019   INR 2.7 03/09/2019    Anticoagulation Dosing: Description   Take 1 tablet of your teal 6mg  warfarin tablets by mouth once daily on Tuesdays and Fridays of each week, on all other days, take 1/2 tablet by mouth once daily.     INR today: Therapeutic  PLAN Weekly dose was unchanged, will remain on 27mg  per week of warfarin.   Patient Instructions  Patient instructed to take medications as defined in the Anti-coagulation Track section of this encounter.  Patient instructed to take today's dose.  Patient instructed to take 1 tablet of your teal 6mg  warfarin tablets by mouth once daily on Tuesdays and Fridays of each week, on all other days, take 1/2 tablet by mouth once daily. Patient verbalized understanding of these instructions.     Patient advised to contact clinic or seek medical attention if signs/symptoms of bleeding or thromboembolism occur.  Patient verbalized understanding by repeating back information and was advised to contact me if further medication-related questions arise. Patient was also provided an information handout.  Follow-up Return in 25 days (on 06/08/2019) for Follow up INR.  Andrew [name]  15 minutes spent face-to-face with the patient during the encounter. 50% of time spent on education, including signs/sx bleeding and clotting, as well as food and drug interactions with warfarin. 50% of time was spent on fingerprick POC INR sample collection,processing, results determination, and documentation in http://www.kim.net/.

## 2019-05-14 NOTE — Patient Instructions (Signed)
1. Neurorehab referral 2. I will mail you your test results 3. Get the ECHO (heart picture) 4. Get the back MRI

## 2019-05-14 NOTE — Assessment & Plan Note (Addendum)
This problem is new.  He states for the past couple days, but less than a week, he has had lower extremity edema.  He states this has continued even when he props his legs up.  He has pain in his feet but it is no different than his chronic pain.  His daughter states his left foot became blue.  There is a discrete transition zone of his left lower extremity from no edema to edema and his left lower extremity is worse than the right.  His feet are warm and perfused.  I do not think that he has arterial ischemia.  He does have varicose veins but that would not cause generalized edema.  He does not have any signs or symptoms of intervascular volume overload or pulmonary edema.  I am going to start with a CMP, and a microalbumin (I did not order the urine so I will send [facility] kit to the house).  His INR is therapeutic and has been so I seriously doubt he has a DVT.  I am also getting an echo as he has A. fib and is high risk for a cardiomyopathy.  PLAN : Assess for liver and renal function Echo

## 2019-05-14 NOTE — Addendum Note (Signed)
Addended by: Hulan Fray on: 05/14/2019 04:44 PM   Modules accepted: Orders

## 2019-05-14 NOTE — Progress Notes (Signed)
   Subjective:    Patient ID: Andrew [name], male    DOB: 02-13-1934, 85 y.o.   MRN: [id number]  HPI  Andrew [name] is here for pain. Please see the A&P for the status of the pt's chronic medical problems.  ROS : per ROS section and in problem oriented charting. All other systems are negative.  PMHx, Soc hx, and / or Fam hx : His wife recently passed away.  He lives in a [place].  He has a renal physician, neurologist, and eye doctor at the [place].  He saw his nephrologist this month and will see his neurologist in November.  He will see [place] eye doctor in October.  Review of Systems  Constitutional: Positive for activity change, appetite change and unexpected weight change.       His appetite has decreased but he also has ambulation difficulties getting to the kitchen  Respiratory: Positive for cough. Negative for shortness of breath.   Cardiovascular: Positive for leg swelling.  Gastrointestinal: Negative for vomiting.  Musculoskeletal: Positive for back pain, gait problem and neck pain.       Stiffness of right shoulder only but not left or hips  Skin: Positive for color change.  Neurological: Positive for weakness and numbness. Negative for dizziness, tremors and light-headedness.       1 fall in January, none since       Objective:   Physical Exam Constitutional:      Appearance: Normal appearance. He is not ill-appearing, toxic-appearing or diaphoretic.     Comments: Appears disheveled  HENT:     Head: Normocephalic and atraumatic.     Right Ear: External ear normal.     Left Ear: External ear normal.  Eyes:     General: No scleral icterus.       Right eye: No discharge.        Left eye: No discharge.     Conjunctiva/sclera: Conjunctivae normal.  Cardiovascular:     Rate and Rhythm: Normal rate. Rhythm irregular.     Heart sounds: No murmur.     Comments: +1 pitting edema with a very discrete transition at about mid lower tibia on the left.  Less edema on the right.   Varicose veins bilaterally distal to the knee.  Fungal nail changes bilaterally.  Extremities are warm and perfused. Pulmonary:     Effort: Pulmonary effort is normal. No respiratory distress.     Breath sounds: Normal breath sounds. No wheezing or rhonchi.  Musculoskeletal:        General: Swelling present. No tenderness, deformity or signs of injury.     Right lower leg: Edema present.     Left lower leg: Edema present.  Skin:    General: Skin is warm and dry.  Neurological:     Mental Status: He is alert.     Comments: Lower extremity muscular strength is 5 out of 5 with the exception of left knee extension and flexion which is 4+ out of 5.  No tremors noted.  I did not assess gait.  Decreased sensation of feet bilaterally  Psychiatric:        Mood and Affect: Mood normal.        Behavior: Behavior normal.        Thought Content: Thought content normal.        Judgment: Judgment normal.       Assessment & Plan:

## 2019-05-14 NOTE — Assessment & Plan Note (Signed)
This problem is chronic and uncontrolled.  He states this started around July and is only to solid food, particularly meat, hamburgers, pizza.  This occurs about every other day and it makes him gag.  It only resulted in vomiting once.  He drinks a lot of water and eventually the food will go down.  It is getting caught in his throat, between his mouth and supraclavicular area.  He has had weight loss as in December he was 189 pounds and currently he is 166.  During this time, his wife also passed away and so weight loss is likely multifactorial.  At his last [facility] appointment, he was started on Flonase as it was felt that postnasal drip may be contributing and he was referred to GI.  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  If this does not show an etiology, I would then have him see GI for an EGD.  PLAN : Modified barium swallow

## 2019-05-14 NOTE — Assessment & Plan Note (Addendum)
This problem is chronic and stable.  We discussed his ultrasound results earlier this year which showed a stable aneurysm at 2.9 cm.  PLAN : follow

## 2019-05-15 ENCOUNTER — Encounter: Payer: Self-pay | Admitting: Internal Medicine

## 2019-05-15 LAB — BMP8+ANION GAP
Anion Gap: 17 mmol/L (ref 10.0–18.0)
BUN/Creatinine Ratio: 20 (ref 10–24)
BUN: 37 mg/dL — ABNORMAL HIGH (ref 8–27)
CO2: 21 mmol/L (ref 20–29)
Calcium: 9.4 mg/dL (ref 8.6–10.2)
Chloride: 104 mmol/L (ref 96–106)
Creatinine, Ser: 1.82 mg/dL — ABNORMAL HIGH (ref 0.76–1.27)
GFR calc Af Amer: 39 mL/min/{1.73_m2} — ABNORMAL LOW (ref >59)
GFR calc non Af Amer: 34 mL/min/{1.73_m2} — ABNORMAL LOW (ref >59)
Glucose: 105 mg/dL — ABNORMAL HIGH (ref 65–99)
Potassium: 5.1 mmol/L (ref 3.5–5.2)
Sodium: 142 mmol/L (ref 134–144)

## 2019-05-15 LAB — TSH: TSH: 5.86 u[IU]/mL — ABNORMAL HIGH (ref 0.450–4.500)

## 2019-05-15 LAB — T4, FREE: Free T4: 1.22 ng/dL (ref 0.82–1.77)

## 2019-05-21 ENCOUNTER — Ambulatory Visit (HOSPITAL_COMMUNITY)
Admission: RE | Admit: 2019-05-21 | Discharge: 2019-05-21 | Disposition: A | Discharge status: Home / Self Care | Payer: Medicare Other | Source: Ambulatory Visit | Attending: Internal Medicine | Admitting: Internal Medicine

## 2019-05-21 ENCOUNTER — Other Ambulatory Visit: Payer: Self-pay

## 2019-05-21 DIAGNOSIS — I083 Combined rheumatic disorders of mitral, aortic and tricuspid valves: Secondary | ICD-10-CM | POA: Diagnosis not present

## 2019-05-21 DIAGNOSIS — Z87891 Personal history of nicotine dependence: Secondary | ICD-10-CM | POA: Diagnosis not present

## 2019-05-21 DIAGNOSIS — N189 Chronic kidney disease, unspecified: Secondary | ICD-10-CM | POA: Insufficient documentation

## 2019-05-21 DIAGNOSIS — I4891 Unspecified atrial fibrillation: Secondary | ICD-10-CM | POA: Diagnosis not present

## 2019-05-21 NOTE — Progress Notes (Signed)
  Echocardiogram 2D Echocardiogram has been performed.  Andrew [name] 05/21/2019, 4:15 PM

## 2019-05-22 ENCOUNTER — Telehealth: Payer: Self-pay | Admitting: Internal Medicine

## 2019-05-22 DIAGNOSIS — I5022 Chronic systolic (congestive) heart failure: Secondary | ICD-10-CM

## 2019-05-22 MED ORDER — LISINOPRIL 10 MG PO TABS: 10.0000 mg | ORAL_TABLET | Freq: Every day | ORAL | 0 refills | Status: DC

## 2019-05-22 MED ORDER — DICLOFENAC SODIUM 1 % TD GEL: 2.0000 g | Freq: Four times a day (QID) | TRANSDERMAL | 0 refills | Status: DC

## 2019-05-22 NOTE — Telephone Encounter (Signed)
I called to inform Andrew [name] about his echo results.  I explained this is consistent with heart failure.  He is on a beta-blocker for his atrial fibrillation and I am going to start lisinopril 10 mg.  I am starting with a low-dose as his blood pressure was normal at his most recent appointment.  He can start it as soon as the pharmacy receives it.  I will schedule him an appointment on August 31, September 1, or September 2 for an appointment to assess lower extremity edema, check a BMP, check blood pressure, and further discuss ischemic work-up.  He had no discrete wall motion abnormality but rather diffuse hypokinesis.  His wife also had A. fib and developed systolic heart failure and wanted nothing to do with a cardiologist or ischemic work-up.  I started the ischemic work-up talk and ask him to think about it.Marland Kitchen  He also requested a prescription for Voltaren gel which I had recommended over-the-counter at his last appointment.  I explained I would send it in but I did not know if insurance would cover it as it is OTC.  He also asked for a doughnut to sit on because his bottom is sore.  I recommended he not use a donut because it can impair venous drainage and lead to ulceration and so I encouraged him to get eggshell bedding and cut it to fit his chair.

## 2019-05-25 NOTE — Telephone Encounter (Signed)
Unable to reach patient by phone this morning to schedule an appt.  Left message asking to please call the clinic back.

## 2019-05-27 ENCOUNTER — Ambulatory Visit (INDEPENDENT_AMBULATORY_CARE_PROVIDER_SITE_OTHER): Payer: Medicare Other | Admitting: Internal Medicine

## 2019-05-27 ENCOUNTER — Other Ambulatory Visit: Payer: Self-pay

## 2019-05-27 ENCOUNTER — Ambulatory Visit (HOSPITAL_COMMUNITY)
Admission: RE | Admit: 2019-05-27 | Discharge: 2019-05-27 | Disposition: A | Discharge status: Home / Self Care | Payer: Medicare Other | Source: Ambulatory Visit | Attending: Internal Medicine | Admitting: Internal Medicine

## 2019-05-27 VITALS — BP 100/56 | HR 51 | Temp 97.7°F | Ht 70.0 in | Wt 169.3 lb

## 2019-05-27 DIAGNOSIS — N183 Chronic kidney disease, stage 3 (moderate): Secondary | ICD-10-CM

## 2019-05-27 DIAGNOSIS — Z23 Encounter for immunization: Secondary | ICD-10-CM | POA: Diagnosis not present

## 2019-05-27 DIAGNOSIS — R21 Rash and other nonspecific skin eruption: Secondary | ICD-10-CM | POA: Diagnosis not present

## 2019-05-27 DIAGNOSIS — I5021 Acute systolic (congestive) heart failure: Secondary | ICD-10-CM | POA: Insufficient documentation

## 2019-05-27 DIAGNOSIS — Z79899 Other long term (current) drug therapy: Secondary | ICD-10-CM

## 2019-05-27 DIAGNOSIS — I4891 Unspecified atrial fibrillation: Secondary | ICD-10-CM

## 2019-05-27 DIAGNOSIS — I714 Abdominal aortic aneurysm, without rupture: Secondary | ICD-10-CM

## 2019-05-27 DIAGNOSIS — G40909 Epilepsy, unspecified, not intractable, without status epilepticus: Secondary | ICD-10-CM

## 2019-05-27 MED ORDER — FUROSEMIDE 20 MG PO TABS: 20.0000 mg | ORAL_TABLET | Freq: Every day | ORAL | 0 refills | Status: DC

## 2019-05-27 NOTE — Patient Instructions (Signed)
It was a pleasure to see you today Andrew [name]. Please make the following changes:  For your heart failure: please continue taking metoprolol 12.5mg  daily along with lisinopril 10mg  daily. Please start taking lasix 20mg  daily. I have also referred you to cardiology.   If you have any questions or concerns, please call our clinic at [phone] between 9am-5pm and after hours call [phone] and ask for the internal medicine resident on call. If you feel you are having a medical emergency please call 911.   Thank you, we look forward to help you remain healthy!  Lars Mage, MD Internal Medicine PGY3   Heart Failure, Diagnosis  Heart failure is a condition in which the heart has trouble pumping blood because it has become weak or stiff. This means that the heart does not pump blood well enough for the body to stay healthy. For some people with heart failure, fluid may back up into the lungs. There may also be swelling (edema) in the lower legs. Heart failure is usually a long-term (chronic) condition. It is important for you to take good care of yourself and follow the treatment plan from your health care provider. What are the causes? This condition may be caused by:  High blood pressure (hypertension). Hypertension causes the heart muscle to work harder than normal. This makes the heart stiff or weak.  Coronary artery disease, or CAD. CAD is the buildup of cholesterol and fat (plaque) in the arteries of the heart.  Heart attack, also called myocardial infarction. This injures the heart muscle, making it hard for the heart to pump blood.  Abnormal heart valves. The valves do not open and close properly, forcing the heart to pump harder to keep the blood flowing.  Heart muscle disease (cardiomyopathy or myocarditis). This is damage to the heart muscle. It can increase the risk of heart failure.  Lung disease. The heart works harder when the lungs are not healthy.  Abnormal heart  rhythms. These can lead to heart failure. What increases the risk? The risk of heart failure increases as a person ages. This condition is also more likely to develop in people who:  Are overweight.  Are male.  Smoke or chew tobacco.  Abuse alcohol or illegal drugs.  Have taken medicines that can damage the heart, such as chemotherapy drugs.  Have diabetes.  Have abnormal heart rhythms.  Have thyroid problems.  Have low blood counts (anemia). What are the signs or symptoms? Symptoms of this condition include:  Shortness of breath with activity, such as when climbing stairs.  A cough that does not go away.  Swelling of the feet, ankles, legs, or abdomen.  Losing weight for no reason.  Trouble breathing when lying flat (orthopnea).  Waking from sleep because of the need to sit up and get more air.  Rapid heartbeat.  Tiredness (fatigue) and loss of energy.  Feeling light-headed, dizzy, or close to fainting.  Loss of appetite.  Nausea.  Waking up more often during the night to urinate (nocturia).  Confusion. How is this diagnosed? This condition is diagnosed based on:  Your medical history, symptoms, and a physical exam.  Diagnostic tests, which may include: ? Echocardiogram. ? Electrocardiogram (ECG). ? Chest X-ray. ? Blood tests. ? Exercise stress test. ? Radionuclide scans. ? Cardiac catheterization and angiogram. How is this treated? Treatment for this condition is aimed at managing the symptoms of heart failure. Medicines Treatment may include medicines that:  Help lower blood pressure by relaxing (dilating)  the blood vessels. These medicines are called ACE inhibitors (angiotensin-converting enzyme) and ARBs (angiotensin receptor blockers).  Cause the kidneys to remove salt and water from the blood through urination (diuretics).  Improve heart muscle strength and prevent the heart from beating too fast (beta blockers).  Increase the force of  the heartbeat (digoxin). Healthy behavior changes     Treatment may also include making healthy lifestyle changes, such as:  Reaching and staying at a healthy weight.  Quitting smoking or chewing tobacco.  Eating heart-healthy foods.  Limiting or avoiding alcohol.  Stopping the use of illegal drugs.  Being physically active.  Other treatments Other treatments may include:  Procedures to open blocked arteries or repair damaged valves.  Placing a pacemaker to improve heart function (cardiac resynchronization therapy).  Placing a device to treat serious abnormal heart rhythms (implantable cardioverter defibrillator, or ICD).  Placing a device to improve the pumping ability of the heart (left ventricular assist device, or LVAD).  Receiving a healthy heart from a donor (heart transplant). This is done when other treatments have not helped. Follow these instructions at home:  Manage other health conditions as told by your health care provider. These may include hypertension, diabetes, thyroid disease, or abnormal heart rhythms.  Get ongoing education and support as needed. Learn as much as you can about heart failure.  Keep all follow-up visits as told by your health care provider. This is important. Summary  Heart failure is a condition in which the heart has trouble pumping blood because it has become weak or stiff.  This condition is caused by high blood pressure and other diseases of the heart and lungs.  Symptoms of this condition include shortness of breath, tiredness (fatigue), nausea, and swelling of the feet, ankles, legs, or abdomen.  Treatments for this condition may include medicines, lifestyle changes, and surgery.  Manage other health conditions as told by your health care provider. This information is not intended to replace advice given to you by your health care provider. Make sure you discuss any questions you have with your health care provider. Document  Released: 09/10/2005 Document Revised: 11/28/2018 Document Reviewed: 11/28/2018 Elsevier Patient Education  [place].

## 2019-05-27 NOTE — Progress Notes (Signed)
   CC: Bilateral lower extremity edema  HPI:  Andrew [name] is a 85 y.o. with atrial fibrillation, rash, CKD 3, AAA, seizure disorder who presents for follow up of of bilateral lower extremity edema. Please see problem based charting for evaluation, assessment, and plan.  Past Medical History:  Diagnosis Date  . A-fib ([place])   . Cancer ([place])   . Diabetes mellitus without complication ([place])   . Dysrhythmia   . GI bleed 04/02/2014  . Hypertension   . Seizures ([place])    Review of Systems:    Review of Systems  Constitutional: Negative for chills and fever.  Respiratory: Negative for cough and shortness of breath.   Cardiovascular: Negative for chest pain.  Gastrointestinal: Negative for diarrhea, nausea and vomiting.  Genitourinary: Negative for dysuria.  Neurological: Negative for dizziness.   Physical Exam:  Vitals:   05/27/19 1021  BP: (!) 100/56  Pulse: (!) 51  Temp: 97.7 F (36.5 C)  TempSrc: Oral  SpO2: 100%  Weight: 169 lb 4.8 oz (76.8 kg)  Height: 5\' 10"  (1.778 m)  Physical Exam  Constitutional: He is oriented to person, place, and time. He appears well-developed and well-nourished. No distress.  HENT:  Head: Normocephalic and atraumatic.  Eyes: Conjunctivae are normal.  Neck: JVD present.  Cardiovascular: Normal rate, regular rhythm and normal heart sounds.  Respiratory: Effort normal and breath sounds normal. No respiratory distress. He has no wheezes.  GI: Soft. Bowel sounds are normal. He exhibits no distension. There is no abdominal tenderness.  Musculoskeletal:        General: Edema (1+ bilaterally) present.  Neurological: He is alert and oriented to person, place, and time.  Skin: He is not diaphoretic.  Psychiatric: He has a normal mood and affect. His behavior is normal. Judgment and thought content normal.   Assessment & Plan:   See Encounters Tab for problem based charting.  Patient discussed with Dr. Rebeca Alert

## 2019-05-28 LAB — HIV ANTIBODY (ROUTINE TESTING W REFLEX): HIV Screen 4th Generation wRfx: NONREACTIVE

## 2019-05-28 NOTE — Progress Notes (Signed)
Internal Medicine Clinic Attending  Case discussed with Dr. Maricela Bo at the time of the visit.  We reviewed the resident's history and exam and pertinent patient test results.  I agree with the assessment, diagnosis, and plan of care documented in the resident's note.  Here for follow up of new HFrEF, having bilateral leg swelling. Already on ACE-I and BB, metoprolol is quite low dose, but he is in Afib with slow ventricular response and BP is soft, unable to increase at this time. Will cautiously start diuretic, though suspect he will need higher dose to be effective given his CKD. He should follow up with cardiology as well, though given his frailty he may not be a candidate for more aggressive interventions.  Lenice Pressman, M.D., Ph.D.

## 2019-05-28 NOTE — Assessment & Plan Note (Addendum)
Patient with new systolic heart failure per echo done 05/21/2019.  TTE showed EF 35-40%, left ventricular diffuse hypokinesis, right ventricular pressure elevated, severely dilated left atrial size.  Left ventricular diastolic function was not able to be evaluated secondary to atrial fibrillation.  Causes for patient's acute heart failure were evaluated.  TSH = 5.86, free T4 1.22 suggestive of subclinical hypothyroidism.  HIV negative.  Assessment and plan Given patient's diffuse hypokinesis of left ventricle most likely cause of patient's heart failure is ischemic in nature. Given the patient's age it is less likely that a heart catheterization is appropriate at this time.  Patient continues to also have atrial fibrillation.  EKG done during this visit showed atrial fibrillation with bradycardia.  Will refer to cardiology for further evaluation of possible ischemia leading to new onset heart failure.    Optimized heart failure regimen with continuing lisinopril 10 mg daily, metoprolol 12.5 mg daily.  Started patient on Lasix 20 mg p.o. as he states that he is already been urinating every 30 minutes.   Patient's bilateral lower extremity edema is likely secondary to his systolic heart failure.  It appears that the patient still has not completed his microalbumin testing that was sent via [facility] by pcp Dr. Lynnae January.

## 2019-06-08 ENCOUNTER — Ambulatory Visit: Payer: Medicare Other

## 2019-07-06 DIAGNOSIS — H353121 Nonexudative age-related macular degeneration, left eye, early dry stage: Secondary | ICD-10-CM | POA: Diagnosis not present

## 2019-07-06 DIAGNOSIS — H0102A Squamous blepharitis right eye, upper and lower eyelids: Secondary | ICD-10-CM | POA: Diagnosis not present

## 2019-07-06 DIAGNOSIS — H04123 Dry eye syndrome of bilateral lacrimal glands: Secondary | ICD-10-CM | POA: Diagnosis not present

## 2019-07-06 DIAGNOSIS — E119 Type 2 diabetes mellitus without complications: Secondary | ICD-10-CM | POA: Diagnosis not present

## 2019-07-06 DIAGNOSIS — H401124 Primary open-angle glaucoma, left eye, indeterminate stage: Secondary | ICD-10-CM | POA: Diagnosis not present

## 2019-07-17 ENCOUNTER — Ambulatory Visit (INDEPENDENT_AMBULATORY_CARE_PROVIDER_SITE_OTHER): Payer: Medicare Other | Admitting: Cardiology

## 2019-07-17 ENCOUNTER — Other Ambulatory Visit: Payer: Self-pay

## 2019-07-17 ENCOUNTER — Encounter: Payer: Self-pay | Admitting: Cardiology

## 2019-07-17 VITALS — BP 114/68 | HR 61 | Ht 70.0 in | Wt 171.0 lb

## 2019-07-17 DIAGNOSIS — I5022 Chronic systolic (congestive) heart failure: Secondary | ICD-10-CM

## 2019-07-17 DIAGNOSIS — I714 Abdominal aortic aneurysm, without rupture, unspecified: Secondary | ICD-10-CM

## 2019-07-17 DIAGNOSIS — N184 Chronic kidney disease, stage 4 (severe): Secondary | ICD-10-CM

## 2019-07-17 DIAGNOSIS — I4821 Permanent atrial fibrillation: Secondary | ICD-10-CM | POA: Diagnosis not present

## 2019-07-17 MED ORDER — FUROSEMIDE 20 MG PO TABS: 40.0000 mg | ORAL_TABLET | Freq: Every day | ORAL | 3 refills | Status: DC

## 2019-07-17 NOTE — Patient Instructions (Signed)
Medication Instructions:  Please increase your Furosemide to 40 mg daily. Continue all other medications as listed.  *If you need a refill on your cardiac medications before your next appointment, please call your pharmacy*  Follow-Up: At [facility], you and your health needs are our priority.  As part of our continuing mission to provide you with exceptional heart care, we have created designated Provider Care Teams.  These Care Teams include your primary Cardiologist (physician) and Advanced Practice Providers (APPs -  Physician Assistants and Nurse Practitioners) who all work together to provide you with the care you need, when you need it.  Your next appointment:   4 weeks  The format for your next appointment:   Virtual Visit   Provider:   Kathyrn Drown, NP   Thank you for choosing [name]!!     Please wear knee high compression stockings during the day.  You may remove them at bedtime.  20-30 mm/hg.   [employer] in [place] has them for a good price. [phone]  Please limit your fluid intake to no more than 2 liters or 2,000 ml a day. Please weigh daily.

## 2019-07-17 NOTE — Progress Notes (Signed)
Cardiology Office Note:    Date:  07/17/2019   ID:  Andrew [name], DOB [birth year], MRN [id number]  PCP:  Bartholomew Crews, MD  Cardiologist:  No primary care provider on file.  Electrophysiologist:  None   Referring MD: Lucious Groves, DO     History of Present Illness:    Andrew [name] is a 85 y.o. male here for the evaluation of systolic heart failure, discovered on echocardiogram 05/21/2019.  EF 35 to 40% diffuse hypokinesis with dilated left atrium.  Thyroid testing showed subclinical hypothyroidism, HIV negative.  Atrial fibrillation was also noted as well.  Referred to [place] to help determine the cause of cardiomyopathy.  Echocardiogram was ordered originally on 05/14/2019 office visit secondary to bilateral lower extremity edema.  Feet are quite swollen.  On lisinopril 10 mg a day and metoprolol 12.5 mg a day.  Low-dose Lasix 20 mg.  Has a history of AAA as well.  2.9 cm stable  Also has seizure history-followed at [place].  There was concern given his frailty that he would not necessarily be a candidate for further invasive evaluation.  Overall he is not complaining of any anginal symptoms, no chest pain, no significant shortness of breath.  He is tired at times.  He is here today in wheelchair with the help of his daughter.  His wife recently died of heart failure and was back-and-forth in the hospital multiple times.    He has been losing weight.  Denies any significant early satiety however which usually accompanies heart failure.    Past Medical History:  Diagnosis Date  . A-fib ([place])   . Abdominal aortic aneurysm (AAA) without rupture ([place]) 05/15/2018   Incidental finding on CT in 2012 Stable on ultrasound 2020  . Anemia 07/17/2018   He has a remote history of colon cancer with a colectomy at the [place].  No records available He had colonoscopy in 2015 with a 7-year follow-up recommended   . Atrial fibrillation ([place]) 04/02/2014  . Cancer ([place])   . Chronic kidney disease,  stage 3, mod decreased GFR (HCC) 04/03/2014  . Diabetes mellitus without complication ([place])   . Dysphagia 02/10/2019  . Dysrhythmia   . Enlarged prostate 05/15/2018  . Gait abnormality 06/20/2018  . GI bleed 04/02/2014  . History of colon cancer 04/02/2014   Colonoscopy Dr. Deatra Ina July 2015 = 3 mm sessile polyp in the sigmoid colon.  Pathology showed tubular adenoma without high-grade dysplasia.  Surveillance colonoscopy 7 to 10 years.  . History of diabetes mellitus 02/03/2018  . Hypertension   . Left hip pain 04/14/2019  . Neuropathy 02/18/2018  . Sebaceous cyst 05/15/2018  . Seizure ([place]) 04/02/2014  . Seizures ([place])   . Systolic heart failure ([place]) 05/14/2019   Diagnosed August 2020 No ischemic work-up    Past Surgical History:  Procedure Laterality Date  . APPENDECTOMY    . [place]  . COLON SURGERY    . COLONOSCOPY N/A 04/05/2014   Procedure: COLONOSCOPY;  Surgeon: Inda Castle, MD;  Location: [facility];  Service: Endoscopy;  Laterality: N/A;    Current Medications: Current Meds  Medication Sig  . acetaminophen (TYLENOL) 325 MG tablet Take 2 tablets (650 mg total) by mouth every 6 (six) hours as needed for mild pain (or Fever >/= 101).  . Cholecalciferol (VITAMIN D-3) 1000 units CAPS Take 1,000 Units by mouth daily.  . fluticasone (FLONASE) 50 MCG/ACT nasal spray Place 1 spray into both nostrils daily.  Marland Kitchen  furosemide (LASIX) 20 MG tablet Take 2 tablets (40 mg total) by mouth daily.  . hydrocortisone cream 1 % Apply 1 application topically every 4 (four) hours.  Marland Kitchen lisinopril (ZESTRIL) 20 MG tablet Take 10 mg by mouth daily.  . metoprolol tartrate (LOPRESSOR) 25 MG tablet Take 12.5 mg by mouth at bedtime.   . Multiple Vitamins-Minerals (ONE-A-DAY MENS 50+ ADVANTAGE) TABS Take 1 tablet by mouth at bedtime.  . vitamin B-12 (CYANOCOBALAMIN) 1000 MCG tablet Take 1,000 mcg by mouth daily.  Marland Kitchen warfarin (COUMADIN) 6 MG tablet TAKE 1 TAB ONCE-DAILY AT 6PM ON TUESDAYS  AND FRIDAYS. TAKE 1/2 PILL ALL OTHER DAYS.  Marland Kitchen zonisamide (ZONEGRAN) 100 MG capsule Take 300 mg by mouth 2 (two) times daily. MORNING and EVENING  . [DISCONTINUED] furosemide (LASIX) 20 MG tablet Take 1 tablet (20 mg total) by mouth daily.     Allergies:   Tegretol [carbamazepine], Depakote [divalproex sodium], Dilantin [phenytoin sodium extended], and Phenobarbital   Social History   Socioeconomic History  . Marital status: Married    Spouse name: Not on file  . Number of children: Not on file  . Years of education: Not on file  . Highest education level: Not on file  Occupational History  . Not on file  Social Needs  . Financial resource strain: Not on file  . Food insecurity    Worry: Not on file    Inability: Not on file  . Transportation needs    Medical: Not on file    Non-medical: Not on file  Tobacco Use  . Smoking status: Former Smoker    Quit date: 09/24/1969    Years since quitting: 49.8  . Smokeless tobacco: Never Used  Substance and Sexual Activity  . Alcohol use: No  . Drug use: No  . Sexual activity: Not on file  Lifestyle  . Physical activity    Days per week: Not on file    Minutes per session: Not on file  . Stress: Not on file  Relationships  . Social Herbalist on phone: Not on file    Gets together: Not on file    Attends religious service: Not on file    Active member of club or organization: Not on file    Attends meetings of clubs or organizations: Not on file    Relationship status: Not on file  Other Topics Concern  . Not on file  Social History Narrative  . Not on file     Family History: The patient's family history includes Diabetes Mellitus II in his brother and mother; Prostate cancer in his father.  ROS:   Please see the history of present illness.    No fevers chills nausea vomiting syncope bleeding all other systems reviewed and are negative.  EKGs/Labs/Other Studies Reviewed:    The following studies were reviewed  today:  ECHO 05/21/19:  1. The left ventricle has moderately reduced systolic function, with an ejection fraction of 35-40%. The cavity size was normal. moderate basal septal hypertrophy. Left ventricular diastolic function could not be evaluated secondary to atrial  fibrillation. Left ventricular diffuse hypokinesis.  2. The right ventricle has normal systolic function. The cavity was normal. There is no increase in right ventricular wall thickness. Right ventricular systolic pressure is moderately elevated.  3. Left atrial size was severely dilated.  4. Right atrial size was mildly dilated.  5. Mitral valve regurgitation is mild to moderate by color flow Doppler.  6. The aortic  valve is tricuspid. Aortic valve regurgitation is mild by color flow Doppler.  7. The aorta is normal unless otherwise noted.  8. The inferior vena cava was dilated in size with <50% respiratory variability.  EKG: Atrial fibrillation rate controlled personally reviewed  Recent Labs: 11/27/2018: ALT 11; Hemoglobin 11.7; Platelets 176 05/14/2019: BUN 37; Creatinine, Ser 1.82; Potassium 5.1; Sodium 142; TSH 5.860  Recent Lipid Panel No results found for: CHOL, TRIG, HDL, CHOLHDL, VLDL, LDLCALC, LDLDIRECT  Physical Exam:    VS:  BP 114/68   Pulse 61   Ht 5\' 10"  (1.778 m)   Wt 171 lb (77.6 kg)   SpO2 99%   BMI 24.54 kg/m     Wt Readings from Last 3 Encounters:  07/17/19 171 lb (77.6 kg)  05/27/19 169 lb 4.8 oz (76.8 kg)  05/14/19 166 lb (75.3 kg)     GEN:  Well nourished, well developed in no acute distress HEENT: Normal NECK: No JVD; No carotid bruits LYMPHATICS: No lymphadenopathy CARDIAC: irreg, no murmurs, rubs, gallops RESPIRATORY:  Clear to auscultation without rales, wheezing or rhonchi  ABDOMEN: Soft, non-tender, non-distended MUSCULOSKELETAL:  No edema; No deformity  SKIN: Warm and dry NEUROLOGIC:  Alert and oriented x 3 PSYCHIATRIC:  Normal affect   ASSESSMENT:    1. Permanent atrial  fibrillation ([place])   2. Chronic systolic heart failure ([place])   3. AAA (abdominal aortic aneurysm) without rupture ([place])   4. Chronic kidney disease, stage IV (severe) (HCC)    PLAN:    In order of problems listed above:  Chronic systolic heart failure/newly diagnosed cardiomyopathy -EF 35 to 40%. -He did have longstanding hypertension previously and this may be the etiology.  Thankfully, he is not having any anginal symptoms, no chest pain with exertion for instance.  We had lengthy discussion about potential work-up for cardiomyopathy however I agree with Dr. Lynnae January and his daughter agrees as well that we should first do no harm and we will avoid further invasive testing, cardiac catheterization.  If he was having some serious anginal symptoms, we would consider however he is not.  His wife who recently died from heart failure went through quite a bit. -We will increase his Lasix from 20 mg once a day to 40 mg once a day.  Clearly he has 10 to 15 pounds of fluid in his lower extremities.  His feet often are swelling.  1 to 1.5 L fluid restriction. -Since blood pressure is 114/68, we will continue with current low-dose ACE inhibitor and beta-blocker.  Given his chronic kidney disease, not a good candidate for Entresto or spironolactone. -In 2 to 3 months, we will repeat echocardiogram to see if there has been any improvement in his EF.  Permanent atrial fibrillation -EKG 05/27/2019 demonstrates atrial fibrillation.  Rate controlled.  Chronic anticoagulation -Continue with warfarin.  Weight loss -He really does not describe any early satiety which usually goes along with congestive heart failure.  Continuing to monitor.  We were able to supply him with a scale.  1 month virtual follow-up with Sharee Pimple  Medication Adjustments/Labs and Tests Ordered: Current medicines are reviewed at length with the patient today.  Concerns regarding medicines are outlined above.  No orders of the defined types  were placed in this encounter.  Meds ordered this encounter  Medications  . furosemide (LASIX) 20 MG tablet    Sig: Take 2 tablets (40 mg total) by mouth daily.    Dispense:  90 tablet    Refill:  3    Patient Instructions  Medication Instructions:  Please increase your Furosemide to 40 mg daily. Continue all other medications as listed.  *If you need a refill on your cardiac medications before your next appointment, please call your pharmacy*  Follow-Up: At [facility], you and your health needs are our priority.  As part of our continuing mission to provide you with exceptional heart care, we have created designated Provider Care Teams.  These Care Teams include your primary Cardiologist (physician) and Advanced Practice Providers (APPs -  Physician Assistants and Nurse Practitioners) who all work together to provide you with the care you need, when you need it.  Your next appointment:   4 weeks  The format for your next appointment:   Virtual Visit   Provider:   Kathyrn Drown, NP   Thank you for choosing [facility]!!     Please wear knee high compression stockings during the day.  You may remove them at bedtime.  20-30 mm/hg.   [employer] in [place] has them for a good price. [phone]  Please limit your fluid intake to no more than 2 liters or 2,000 ml a day. Please weigh daily.    Signed, Candee Furbish, MD  07/17/2019 11:48 AM    [facility]

## 2019-07-30 DIAGNOSIS — E119 Type 2 diabetes mellitus without complications: Secondary | ICD-10-CM | POA: Diagnosis not present

## 2019-07-30 DIAGNOSIS — H353121 Nonexudative age-related macular degeneration, left eye, early dry stage: Secondary | ICD-10-CM | POA: Diagnosis not present

## 2019-07-30 DIAGNOSIS — H35033 Hypertensive retinopathy, bilateral: Secondary | ICD-10-CM | POA: Diagnosis not present

## 2019-07-30 DIAGNOSIS — H401124 Primary open-angle glaucoma, left eye, indeterminate stage: Secondary | ICD-10-CM | POA: Diagnosis not present

## 2019-07-30 DIAGNOSIS — H35371 Puckering of macula, right eye: Secondary | ICD-10-CM | POA: Diagnosis not present

## 2019-08-10 NOTE — Progress Notes (Signed)
Virtual Visit via Telephone Note   This visit type was conducted due to national recommendations for restrictions regarding the COVID-19 Pandemic (e.g. social distancing) in an effort to limit this patient's exposure and mitigate transmission in our community.  Due to his co-morbid illnesses, this patient is at least at moderate risk for complications without adequate follow up.  This format is felt to be most appropriate for this patient at this time.  The patient did not have access to video technology/had technical difficulties with video requiring transitioning to audio format only (telephone).  All issues noted in this document were discussed and addressed.  No physical exam could be performed with this format.  Please refer to the patient's chart for his  consent to telehealth for [facility].   Date:  08/10/2019   ID:  Ward Givens, DOB 04-03-1934, MRN [id number]  Patient Location: Home Provider Location: Home  PCP:  Bartholomew Crews, MD  Cardiologist:  Candee Furbish, MD   Evaluation Performed:  Follow-Up Visit  Chief Complaint: Follow-up, seen for Dr. Marlou Porch  History of Present Illness:    Andrew [name] is a 85 y.o. male with a history of systolic HF discovered on echocardiogram 05/21/2019 with an LVEF of 35 to 40% with diffuse hypokinesis and dilated left atrium as well as atrial fibrillation, AAA measured at 2.9 cm and seizure disorder follow-up at [place].  Echocardiogram was initially ordered 05/14/2019 during an office visit secondary to bilateral lower extremity edema.  Given results, there was concern for further invasive evaluation given his frailty.  This was discussed with Dr. Marlou Porch, the patient and his daughter in which they all agreed that further invasive testing would not be highly beneficial given the above.  His Lasix was increased from 20 mg once daily to 40 mg once daily as he clearly had 10 to 15 pounds of fluid in his lower extremities.  He was placed on a 1 to 1.5  L fluid restriction as well as sodium restrictions.  BP was noted to be 114/68 in which he was continued on current dose of ACEI and beta-blocker. Given kidney disease, noted to not be a good candidate for Entresto or spironolactone.  Plan was for reassessment in 2 to 3 months with repeat echocardiogram to assess for improvement.  Today, Mr. Couper is seen for telemedicine visit via telephone and reports that he has had no real change in his LE swelling since the increase of his Lasix.  His daughter was not able to be present during our telemedicine visit.  It was initially to be a virtual visit with video capability however he does not have a smart phone for this option.  Therefore, it is difficult to determine his fluid volume status at this time.  He denies chest pain or shortness of breath.  Has no specific complaints of orthopnea or dizziness, presyncopal or syncopal episodes.  I had him push on his lower extremities and assess for indentation which he reports is present.  Weights have been stable within a few pounds of 160lb for the last month.  We discussed increasing his Lasix to twice daily for 3 days then resume prior dosing of 40 mg daily.  He needs lab work to be performed given his renal dysfunction.  At this time, has no transportation therefore we will set up for home health RN visit with BP and lab work for full assessment.  Patient understands and agrees and reiterated medication plan back to me.  The patient does not have symptoms concerning for COVID-19 infection (fever, chills, cough, or new shortness of breath).   Past Medical History:  Diagnosis Date   A-fib [facility])    Abdominal aortic aneurysm (AAA) without rupture ([place]) 05/15/2018   Incidental finding on CT in 2012 Stable on ultrasound 2020   Anemia 07/17/2018   He has a remote history of colon cancer with a colectomy at the [place].  No records available He had colonoscopy in 2015 with a 7-year follow-up recommended    Atrial  fibrillation ([place]) 04/02/2014   Cancer ([place])    Chronic kidney disease, stage 3, mod decreased GFR ([place]) 04/03/2014   Diabetes mellitus without complication ([place])    Dysphagia 02/10/2019   Dysrhythmia    Enlarged prostate 05/15/2018   Gait abnormality 06/20/2018   GI bleed 04/02/2014   History of colon cancer 04/02/2014   Colonoscopy Dr. Deatra Ina July 2015 = 3 mm sessile polyp in the sigmoid colon.  Pathology showed tubular adenoma without high-grade dysplasia.  Surveillance colonoscopy 7 to 10 years.   History of diabetes mellitus 02/03/2018   Hypertension    Left hip pain 04/14/2019   Neuropathy 02/18/2018   Sebaceous cyst 05/15/2018   Seizure ([place]) 04/02/2014   Seizures ([place])    Systolic heart failure ([place]) 05/14/2019   Diagnosed August 2020 No ischemic work-up   Past Surgical History:  Procedure Laterality Date   APPENDECTOMY     CLAVICLE SURGERY Right 1954   COLON SURGERY     COLONOSCOPY N/A 04/05/2014   Procedure: COLONOSCOPY;  Surgeon: Inda Castle, MD;  Location: [place];  Service: Endoscopy;  Laterality: N/A;     No outpatient medications have been marked as taking for the 08/18/19 encounter (Appointment) with Tommie Raymond, NP.     Allergies:   Tegretol [carbamazepine], Depakote [divalproex sodium], Dilantin [phenytoin sodium extended], and Phenobarbital   Social History   Tobacco Use   Smoking status: Former Smoker    Quit date: 09/24/1969    Years since quitting: 49.9   Smokeless tobacco: Never Used  Substance Use Topics   Alcohol use: No   Drug use: No     Family Hx: The patient's family history includes Diabetes Mellitus II in his brother and mother; Prostate cancer in his father.  ROS:   Please see the history of present illness.     All other systems reviewed and are negative.  Prior CV studies:   The following studies were reviewed today:  ECHO 05/21/19: 1. The left ventricle has moderately reduced systolic function, with  an ejection fraction of 35-40%. The cavity size was normal. moderate basal septal hypertrophy. Left ventricular diastolic function could not be evaluated secondary to atrial  fibrillation. Left ventricular diffuse hypokinesis. 2. The right ventricle has normal systolic function. The cavity was normal. There is no increase in right ventricular wall thickness. Right ventricular systolic pressure is moderately elevated. 3. Left atrial size was severely dilated. 4. Right atrial size was mildly dilated. 5. Mitral valve regurgitation is mild to moderate by color flow Doppler. 6. The aortic valve is tricuspid. Aortic valve regurgitation is mild by color flow Doppler. 7. The aorta is normal unless otherwise noted. 8. The inferior vena cava was dilated in size with <50% respiratory variability.  Labs/Other Tests and Data Reviewed:    EKG:  No ECG reviewed.  Recent Labs: 11/27/2018: ALT 11; Hemoglobin 11.7; Platelets 176 05/14/2019: BUN 37; Creatinine, Ser 1.82; Potassium 5.1; Sodium 142; TSH 5.860  Recent Lipid Panel No results found for: CHOL, TRIG, HDL, CHOLHDL, LDLCALC, LDLDIRECT  Wt Readings from Last 3 Encounters:  07/17/19 171 lb (77.6 kg)  05/27/19 169 lb 4.8 oz (76.8 kg)  05/14/19 166 lb (75.3 kg)     Objective:    Vital Signs:  There were no vitals taken for this visit.   VITAL SIGNS:  reviewed GEN:  no acute distress NEURO:  alert and oriented x 3, no obvious focal deficit PSYCH:  normal affect  ASSESSMENT & PLAN:    1.  Chronic systolic HF with newly diagnosed cardiomyopathy: -As above, echocardiogram with LVEF at 35 to 40% -Lasix increased at last office visit to 40 mg once daily with no significant improvement and LE edema per patient report -Difficult to determine given no ability for virtual visit with video capability -Patient reports pitting edema however no shortness of breath or orthopnea -We will increase Lasix to 40 mg twice daily for 3 days then resume  previous dose of 40 mg p.o. daily -We will have home health RN follow for BP and lab work which will help determine next steps for diuretics -Last creatinine from 05/14/2019 noted to be elevated at 1.82 which can make it difficult for further med titration  2.  Permanent atrial fibrillation: -EKG from 05/27/2019 with atrial fibrillation -Plan was for rate control with beta-blockers and anticoagulation with Coumadin -No complaints of palpitations  3.  Chronic anticoagulation: -As above, on Coumadin for permanent atrial fibrillation -Last INR, 2.3 on 05/14/2019?  4.  Weight loss: -Continue to monitor weight closely -Weight at the last office visit, 171lb -Today 160lb with more recent baseline weight in 158-163lb range.  5.  CKD stage IV: -Last creatinine, 1.82 with a baseline in the 1.4 range -Reports he follows with nephrology through the [facility]  -Obtaining lab work per home health RN for further guidance on diuretic therapy  COVID-19 Education: The signs and symptoms of COVID-19 were discussed with the patient and how to seek care for testing (follow up with PCP or arrange E-visit). The importance of social distancing was discussed today.  Time:   Today, I have spent 20 minutes with the patient with telehealth technology discussing the above problems.     Medication Adjustments/Labs and Tests Ordered: Current medicines are reviewed at length with the patient today.  Concerns regarding medicines are outlined above.   Tests Ordered: No orders of the defined types were placed in this encounter.   Medication Changes: No orders of the defined types were placed in this encounter.   Follow Up:  In Person Dr. Marlou Porch or myself in 3 to 4 weeks  Signed, Kathyrn Drown, NP  08/10/2019 11:37 AM    [place]

## 2019-08-13 ENCOUNTER — Ambulatory Visit: Payer: Medicare Other | Admitting: Internal Medicine

## 2019-08-18 ENCOUNTER — Other Ambulatory Visit: Payer: Self-pay

## 2019-08-18 ENCOUNTER — Telehealth: Payer: Self-pay | Admitting: *Deleted

## 2019-08-18 ENCOUNTER — Telehealth (INDEPENDENT_AMBULATORY_CARE_PROVIDER_SITE_OTHER): Payer: Medicare Other | Admitting: Cardiology

## 2019-08-18 ENCOUNTER — Encounter: Payer: Self-pay | Admitting: Cardiology

## 2019-08-18 VITALS — Ht 70.0 in | Wt 160.0 lb

## 2019-08-18 DIAGNOSIS — N184 Chronic kidney disease, stage 4 (severe): Secondary | ICD-10-CM | POA: Diagnosis not present

## 2019-08-18 DIAGNOSIS — I5022 Chronic systolic (congestive) heart failure: Secondary | ICD-10-CM | POA: Diagnosis not present

## 2019-08-18 DIAGNOSIS — I4821 Permanent atrial fibrillation: Secondary | ICD-10-CM | POA: Diagnosis not present

## 2019-08-18 DIAGNOSIS — I5021 Acute systolic (congestive) heart failure: Secondary | ICD-10-CM | POA: Diagnosis not present

## 2019-08-18 NOTE — Telephone Encounter (Signed)
[place] Visit Initial Request  Date of Request ([employer]):  August 18, 2019  Requesting Provider:  Kathyrn Drown, NP    Agency Requested:    [facility] Contact:  Glory Buff, NP [place], [place] [address] Phone #:  [phone] Fax #:  [phone]  Patient Demographic Information: Name:  Andrew [name] Age:  85 y.o.   DOB:  [birth year]  MRN:  [id number]   Address:   [address] [place] [address]   Phone Numbers:   Home Phone [phone]  Home Phone [phone]     Emergency Contact Information on File:   Contact Information    Name Relation Home Work Mobile   [place] Daughter   [phone]      The above family members may be contacted for information on this patient (review DPR on file):  No    Patient Clinical Information:  Primary Care Provider:  Bartholomew Crews, MD  Primary Cardiologist:  Candee Furbish, MD  Primary Electrophysiologist:  None   Past Medical Hx: Mr. Satterly  has a past medical history of A-fib [facility]), Abdominal aortic aneurysm (AAA) without rupture ([place]) (05/15/2018), Anemia (07/17/2018), Atrial fibrillation ([place]) (04/02/2014), Cancer ([place]), Chronic kidney disease, stage 3, mod decreased GFR ([place]) (04/03/2014), Diabetes mellitus without complication ([place]), Dysphagia (02/10/2019), Dysrhythmia, Enlarged prostate (05/15/2018), Gait abnormality (06/20/2018), GI bleed (04/02/2014), History of colon cancer (04/02/2014), History of diabetes mellitus (02/03/2018), Hypertension, Left hip pain (04/14/2019), Neuropathy (02/18/2018), Sebaceous cyst (05/15/2018), Seizure ([place]) (04/02/2014), Seizures ([place]), and Systolic heart failure ([place]) (05/14/2019).   Allergies: He is allergic to tegretol [carbamazepine]; depakote [divalproex sodium]; dilantin [phenytoin sodium extended]; and phenobarbital.   Medications: Current Outpatient Medications on File Prior to Visit  Medication Sig  . acetaminophen  (TYLENOL) 325 MG tablet Take 2 tablets (650 mg total) by mouth every 6 (six) hours as needed for mild pain (or Fever >/= 101).  . Cholecalciferol (VITAMIN D-3) 1000 units CAPS Take 1,000 Units by mouth daily.  . fluticasone (FLONASE) 50 MCG/ACT nasal spray Place 1 spray into both nostrils daily.  . furosemide (LASIX) 20 MG tablet Take 2 tablets (40 mg total) by mouth daily.  . hydrocortisone cream 1 % Apply 1 application topically every 4 (four) hours.  Marland Kitchen lisinopril (ZESTRIL) 20 MG tablet Take 10 mg by mouth daily.  . metoprolol tartrate (LOPRESSOR) 25 MG tablet Take 12.5 mg by mouth at bedtime.   . Multiple Vitamins-Minerals (ONE-A-DAY MENS 50+ ADVANTAGE) TABS Take 1 tablet by mouth at bedtime.  . vitamin B-12 (CYANOCOBALAMIN) 1000 MCG tablet Take 1,000 mcg by mouth daily.  Marland Kitchen warfarin (COUMADIN) 6 MG tablet TAKE 1 TAB ONCE-DAILY AT 6PM ON TUESDAYS AND FRIDAYS. TAKE 1/2 PILL ALL OTHER DAYS.  Marland Kitchen zonisamide (ZONEGRAN) 100 MG capsule Take 300 mg by mouth 2 (two) times daily. MORNING and EVENING   No current facility-administered medications on file prior to visit.      Social Hx: He  reports that he quit smoking about 49 years ago. He has never used smokeless tobacco. He reports that he does not drink alcohol or use drugs.    Diagnosis/Reason for Visit:   CKD, A-FIB,HEART FAILURE; BP CHECK AND LABS  Services Requested:  Vital Signs (BP, Pulse, O2, Weight)  Physical Exam  Medication Reconciliation  Labs:  BMET  # of Visits Needed/Frequency per Week: 1 VISIT ASAP

## 2019-08-18 NOTE — Patient Instructions (Signed)
Medication Instructions:   Your physician has recommended you make the following change in your medication:   Increase your Lasix to 40MG , 1 tablet by mouth twice a day for 3 days, then go back to your regular dose of 40MG , 1 tablet by mouth once a day.  *If you need a refill on your cardiac medications before your next appointment, please call your pharmacy*  Lab Work  Will have home health draw a BMET   Testing/Procedures:  None ordered today  Follow-Up:  With Kathyrn Drown, NP on 09/21/19 at 9:00AM   Other Instructions  We will have home health come and do a blood pressure check and BMET

## 2019-08-19 DIAGNOSIS — N184 Chronic kidney disease, stage 4 (severe): Secondary | ICD-10-CM | POA: Diagnosis not present

## 2019-08-20 LAB — BASIC METABOLIC PANEL
BUN/Creatinine Ratio: 19 (ref 10–24)
BUN: 69 mg/dL — ABNORMAL HIGH (ref 8–27)
CO2: 21 mmol/L (ref 20–29)
Calcium: 9.2 mg/dL (ref 8.6–10.2)
Chloride: 103 mmol/L (ref 96–106)
Creatinine, Ser: 3.62 mg/dL — ABNORMAL HIGH (ref 0.76–1.27)
GFR calc Af Amer: 17 mL/min/{1.73_m2} — ABNORMAL LOW (ref >59)
GFR calc non Af Amer: 15 mL/min/{1.73_m2} — ABNORMAL LOW (ref >59)
Glucose: 105 mg/dL — ABNORMAL HIGH (ref 65–99)
Potassium: 5.2 mmol/L (ref 3.5–5.2)
Sodium: 143 mmol/L (ref 134–144)

## 2019-08-24 ENCOUNTER — Telehealth: Payer: Self-pay

## 2019-08-24 ENCOUNTER — Other Ambulatory Visit: Payer: Self-pay

## 2019-08-24 DIAGNOSIS — Z79899 Other long term (current) drug therapy: Secondary | ICD-10-CM

## 2019-08-24 DIAGNOSIS — N183 Chronic kidney disease, stage 3 unspecified: Secondary | ICD-10-CM

## 2019-08-24 NOTE — Telephone Encounter (Signed)
Notes recorded by Frederik Schmidt, RN on 08/24/2019 at 9:22 AM EST  [id number] 11/30  ------

## 2019-08-24 NOTE — Telephone Encounter (Signed)
-----   Message from Tommie Raymond, NP sent at 08/21/2019  9:47 AM EST ----- Please let the patient know that his kidney function has worsened. He will need to stop his lisinopril for now. He can continue to take the Lasix 40mg  once daily for now. Will need to come to the office for repeat lab work. May need an in-person OV for further assessment of his medications including vitals.    Thank you  Sharee Pimple

## 2019-08-24 NOTE — Telephone Encounter (Signed)
Notes recorded by Frederik Schmidt, RN on 08/24/2019 at 2:09 PM EST  The patient's daughter has been notified of the result and verbalized understanding. All questions (if any) were answered.  Frederik Schmidt, RN 08/24/2019 2:09 PM   He will repeat labs on 12/7 and we will schedule him on OV onsite.

## 2019-08-31 ENCOUNTER — Other Ambulatory Visit: Payer: Self-pay

## 2019-08-31 ENCOUNTER — Other Ambulatory Visit: Payer: Medicare Other | Admitting: *Deleted

## 2019-08-31 DIAGNOSIS — N183 Chronic kidney disease, stage 3 unspecified: Secondary | ICD-10-CM | POA: Diagnosis not present

## 2019-08-31 DIAGNOSIS — Z79899 Other long term (current) drug therapy: Secondary | ICD-10-CM | POA: Diagnosis not present

## 2019-08-31 LAB — BASIC METABOLIC PANEL
BUN/Creatinine Ratio: 19 (ref 10–24)
BUN: 65 mg/dL — ABNORMAL HIGH (ref 8–27)
CO2: 25 mmol/L (ref 20–29)
Calcium: 8.9 mg/dL (ref 8.6–10.2)
Chloride: 103 mmol/L (ref 96–106)
Creatinine, Ser: 3.37 mg/dL — ABNORMAL HIGH (ref 0.76–1.27)
GFR calc Af Amer: 18 mL/min/{1.73_m2} — ABNORMAL LOW (ref >59)
GFR calc non Af Amer: 16 mL/min/{1.73_m2} — ABNORMAL LOW (ref >59)
Glucose: 109 mg/dL — ABNORMAL HIGH (ref 65–99)
Potassium: 5.2 mmol/L (ref 3.5–5.2)
Sodium: 142 mmol/L (ref 134–144)

## 2019-09-01 ENCOUNTER — Other Ambulatory Visit: Payer: Self-pay

## 2019-09-01 NOTE — Patient Outreach (Signed)
[facility]) Care Management  09/01/2019  DAWSON ALBERS [birth year] [id number]   Medication Adherence call to Mr. Adonijah Baena HIPPA Compliant Voice message left with a call back number. Mr. Witts is showing past due on Lisinopril 10 mg under [place].   [place] Management Direct Dial [phone]  Fax [phone] [email or web address]

## 2019-09-08 ENCOUNTER — Other Ambulatory Visit: Payer: Self-pay | Admitting: Internal Medicine

## 2019-09-08 NOTE — Progress Notes (Signed)
Cardiology Office Note   Date:  09/21/2019   ID:  Andrew [name], DOB 01-24-1934, Andrew [name]  PCP:  Bartholomew Crews, MD  Cardiologist: Dr. Candee Furbish, MD  Chief Complaint  Andrew presents with   Follow-up    History of Present Illness: Andrew [name] is a 85 y.o. male who presents for follow-up, seen for Dr. Marlou Porch.  Andrew [name] has a history of systolic HF discovered on echocardiogram 05/21/2019 with an LVEF of 35 to 40% with diffuse hypokinesis and dilated left atrium as well as atrial fibrillation, AAA measured at 2.9 cm and seizure disorder follow-up at [facility].  Echocardiogram was initially ordered 05/14/2019 during an office visit secondary to bilateral lower extremity edema.  Given results, there was concern for further invasive evaluation given his frailty.  This was discussed with Dr. Marlou Porch, the Andrew and his [name] in which they all agreed that further invasive testing would not be highly beneficial given the above.  His Lasix was increased from 20 mg once daily to 40 mg once daily as he clearly had 10 to 15 pounds of fluid in his lower extremities.  He was placed on a 1 to 1.5 L fluid restriction as well as sodium restrictions.  BP was noted to be 114/68 in which he was continued on current dose of ACEI and beta-blocker. Given kidney disease, noted to not be a good candidate for Entresto or spironolactone.  Plan was for reassessment in 2 to 3 months with repeat echocardiogram to assess for improvement.  He was last seen by myself 08/18/2019 via telemedicine visit.  At that time he had no real change in his LE swelling despite the increase of his Lasix.  It was noted to be difficult to determine his fluid volume status however he had no complaints of orthopnea or shortness of breath.  His Lasix was increased to 40 mg twice daily from 40 mg daily for 3 days then resume previous dosing with follow-up BMET.  Unfortunately, he was found to have significant increase in his  creatinine from 1.82-3.62.  Given this, his lisinopril was discontinued and repeat lab work was performed 08/31/2019 with only moderate improvement in creatinine at 3.37.  Given this, instructions were given to the Andrew to reduce his Lasix to 20 mg and repeat lab work until in person follow-up could be obtained to evaluate his fluid volume status. Follow-up creatinine improved however remains elevated at 3.18.  Today Andrew [name] presents for follow-up with his [name].  He has been doing well from a CV perspective however as above, has been having issues with acute on chronic CKD with rising creatinine.  He is followed by multiple physicians including his PCP and nephrology per [facility].  He was seen by his PCP last week at which time his Lasix was increased once again to 40 mg p.o. daily given his chronic, unchanged LE edema.  Lab work performed 09/14/2019 with improved however still elevated creatinine at 3.18.  [name] states that he has a follow-up appointment with nephrology at the [facility] 10/02/2019 for which I recommended that he keep.  Is having left-sided lower back pain which is concerning given his rising creatinine.  He reports urinating without issues.  We discussed renal imaging in the meantime.  He denies shortness of breath, chest pain, orthopnea, dizziness, presyncopal or syncopal episodes.  I have recommended that we change his Lasix to as needed until imaging, repeat lab work and follow-up with nephrology has  been performed.  Andrew [name] are in agreement. We will follow closely.    Past Medical History:  Diagnosis Date   A-fib [facility])    Abdominal aortic aneurysm (AAA) without rupture ([place]) 05/15/2018   Incidental finding on CT in 2012 Stable on ultrasound 2020   Anemia 07/17/2018   He has a remote history of colon cancer with a colectomy at the [place].  No records available He had colonoscopy in 2015 with a 7-year follow-up recommended    Atrial fibrillation ([place]) 04/02/2014   Cancer ([place])     Chronic kidney disease, stage 3, mod decreased GFR ([place]) 04/03/2014   Diabetes mellitus without complication ([place])    Dysphagia 02/10/2019   Dysrhythmia    Enlarged prostate 05/15/2018   Gait abnormality 06/20/2018   GI bleed 04/02/2014   History of colon cancer 04/02/2014   Colonoscopy Dr. Deatra Ina July 2015 = 3 mm sessile polyp in the sigmoid colon.  Pathology showed tubular adenoma without high-grade dysplasia.  Surveillance colonoscopy 7 to 10 years.   History of diabetes mellitus 02/03/2018   Hypertension    Left hip pain 04/14/2019   Neuropathy 02/18/2018   Sebaceous cyst 05/15/2018   Seizure ([place]) 04/02/2014   Seizures ([place])    Systolic heart failure ([place]) 05/14/2019   Diagnosed August 2020 No ischemic work-up    Past Surgical History:  Procedure Laterality Date   APPENDECTOMY     CLAVICLE SURGERY Right 1954   COLON SURGERY     COLONOSCOPY N/A 04/05/2014   Procedure: COLONOSCOPY;  Surgeon: Inda Castle, MD;  Location: [place];  Service: Endoscopy;  Laterality: N/A;     Current Outpatient Medications  Medication Sig Dispense Refill   acetaminophen (TYLENOL) 325 MG tablet Take 2 tablets (650 mg total) by mouth every 6 (six) hours as needed for mild pain (or Fever >/= 101). 30 tablet 0   Cholecalciferol (VITAMIN D-3) 1000 units CAPS Take 1,000 Units by mouth daily.     doxycycline (VIBRA-TABS) 100 MG tablet Take 1 tablet (100 mg total) by mouth 2 (two) times daily. 10 tablet 0   fluticasone (FLONASE) 50 MCG/ACT nasal spray Place 1 spray into both nostrils daily. 16 g 2   furosemide (LASIX) 20 MG tablet Take 2 tablets (40 mg total) by mouth daily as needed for fluid or edema. 90 tablet 3   hydrocortisone cream 1 % Apply 1 application topically every 4 (four) hours.     metoprolol tartrate (LOPRESSOR) 25 MG tablet Take 12.5 mg by mouth at bedtime.      Multiple Vitamins-Minerals (ONE-A-DAY MENS 50+ ADVANTAGE) TABS Take 1 tablet by mouth at bedtime.       vitamin B-12 (CYANOCOBALAMIN) 1000 MCG tablet Take 1,000 mcg by mouth daily.     warfarin (COUMADIN) 6 MG tablet TAKE 1 TAB ONCE-DAILY AT 6PM ON TUESDAYS AND FRIDAYS. TAKE 1/2 PILL ALL OTHER DAYS. 60 tablet 1   zonisamide (ZONEGRAN) 100 MG capsule Take 500 mg by mouth daily. 2 tabs in the morning, 3 tabs in the evening  (500 daily)     No current facility-administered medications for this visit.    Allergies:   Tegretol [carbamazepine], Depakote [divalproex sodium], Dilantin [phenytoin sodium extended], and Phenobarbital    Social History:  The Andrew  reports that he quit smoking about 50 years ago. He has never used smokeless tobacco. He reports that he does not drink alcohol or use drugs.   Family History:  The Andrew's family history includes Diabetes  Mellitus II in his brother and mother; Prostate cancer in his father.    ROS:  Please see the history of present illness.   Otherwise, review of systems are positive for none.   All other systems are reviewed and negative.    PHYSICAL EXAM: VS:  BP (!) 98/50    Pulse 76    Ht 5\' 10"  (1.778 m)    Wt 158 lb 12.8 oz (72 kg)    SpO2 94%    BMI 22.79 kg/m  , BMI Body mass index is 22.79 kg/m.   General: Ill-appearing,  NAD Neck: Negative for carotid bruits. No JVD Lungs:Clear to ausculation bilaterally. No wheezes Breathing is unlabored. Cardiovascular: Irregularly irregular with S1 S2. No murmurs Abdomen: Soft, non-tender, non-distended. No obvious abdominal masses. Extremities: 2+ BLE edema. DP 1+ bilaterally Neuro: Alert and oriented. No focal deficits. No facial asymmetry. MAE spontaneously. Psych: Responds to questions appropriately with normal affect.     EKG:  EKG is not ordered today.   Recent Labs: 11/27/2018: ALT 11; Hemoglobin 11.7; Platelets 176 05/14/2019: TSH 5.860 09/14/2019: BUN 42; Creatinine, Ser 3.18; Potassium 5.0; Sodium 142    Lipid Panel No results found for: CHOL, TRIG, HDL, CHOLHDL, VLDL, LDLCALC,  LDLDIRECT    Wt Readings from Last 3 Encounters:  09/21/19 158 lb 12.8 oz (72 kg)  09/14/19 161 lb 11.2 oz (73.3 kg)  08/18/19 160 lb (72.6 kg)     Other studies Reviewed: Additional studies/ records that were reviewed today include:   ASSESSMENT AND PLAN:  1.  Chronic systolic HF with newly diagnosed cardiomyopathy: -As above, echocardiogram with LVEF at 35 to 40% -Given poor creatinine, discontinue scheduled Lasix, will make as needed until lab work, follow-up imaging and nephrology has been seen -Has a chronic, unchanged BLE -Recommended elevation while sitting, compression stockings -Lab work today  2.  Permanent atrial fibrillation: -EKG from 05/27/2019 with atrial fibrillation -Plan was for rate control with beta-blockers and anticoagulation with Coumadin -INR followed by PCP -No complaints of palpitations  3.  Chronic anticoagulation: -As above, on Coumadin for permanent atrial fibrillation -Last INR, 2.3 on 05/14/2019? -Repeat lab work as above>>12/7 3.37>>>12/21 3.18   4.  CKD stage IV: -Last creatinine, 3.18 with a baseline in the 1.4 range -[facility] -nephrology referral -Obtain renal ultrasound to assess for obstruction -Reports urinating well, no pain, no fevers   Current medicines are reviewed at length with the Andrew today.  The Andrew does not have concerns regarding medicines.  The following changes have been made: Change Lasix to as needed, nephrology referral  Labs/ tests ordered today include: BMET, CBC today  Orders Placed This Encounter  Procedures   US RENAL   Basic Metabolic Panel (BMET)   CBC   Ambulatory referral to Nephrology     Disposition:   FU with Dr. Marlou Porch in 1 month  Signed, Kathyrn Drown, NP  09/21/2019 9:57 AM    [address], [place], [place]  [address] Phone: [phone]; Fax: [phone]

## 2019-09-08 NOTE — Telephone Encounter (Signed)
Lisinopril D/C'd 11/30 by cards 2/2 renal dysfxn. He needs appt ACC renal assessment and BMP and BP

## 2019-09-09 ENCOUNTER — Other Ambulatory Visit: Payer: Self-pay

## 2019-09-09 NOTE — Patient Outreach (Signed)
[facility]) Care Management  09/09/2019  SIMS LADAY [birth year] [id number]   Medication Adherence call to Mr. Denzell Colasanti HIPPA Compliant Voice message left with a call back number. Mr. Bamber is showing past due on Lisinopril 10 mg under [place].   [facility] Management Direct Dial [phone]  Fax [phone] [email or web address]

## 2019-09-14 ENCOUNTER — Ambulatory Visit: Payer: Medicare Other

## 2019-09-14 ENCOUNTER — Ambulatory Visit (INDEPENDENT_AMBULATORY_CARE_PROVIDER_SITE_OTHER): Payer: Medicare Other | Admitting: Pharmacist

## 2019-09-14 ENCOUNTER — Ambulatory Visit (INDEPENDENT_AMBULATORY_CARE_PROVIDER_SITE_OTHER): Payer: Medicare Other | Admitting: Internal Medicine

## 2019-09-14 VITALS — BP 102/49 | HR 63 | Temp 98.1°F | Ht 70.0 in | Wt 161.7 lb

## 2019-09-14 DIAGNOSIS — Z7901 Long term (current) use of anticoagulants: Secondary | ICD-10-CM | POA: Diagnosis not present

## 2019-09-14 DIAGNOSIS — L723 Sebaceous cyst: Secondary | ICD-10-CM | POA: Diagnosis not present

## 2019-09-14 DIAGNOSIS — I4891 Unspecified atrial fibrillation: Secondary | ICD-10-CM | POA: Diagnosis not present

## 2019-09-14 DIAGNOSIS — Z87891 Personal history of nicotine dependence: Secondary | ICD-10-CM

## 2019-09-14 DIAGNOSIS — I5022 Chronic systolic (congestive) heart failure: Secondary | ICD-10-CM | POA: Diagnosis not present

## 2019-09-14 DIAGNOSIS — Z5181 Encounter for therapeutic drug level monitoring: Secondary | ICD-10-CM | POA: Diagnosis not present

## 2019-09-14 DIAGNOSIS — I5021 Acute systolic (congestive) heart failure: Secondary | ICD-10-CM | POA: Diagnosis not present

## 2019-09-14 DIAGNOSIS — Z79899 Other long term (current) drug therapy: Secondary | ICD-10-CM

## 2019-09-14 LAB — POCT INR: INR: 2.3 (ref 2.0–3.0)

## 2019-09-14 MED ORDER — FUROSEMIDE 20 MG PO TABS: 40.0000 mg | ORAL_TABLET | Freq: Every day | ORAL | 3 refills | Status: DC

## 2019-09-14 MED ORDER — DOXYCYCLINE HYCLATE 100 MG PO TABS: 100.0000 mg | ORAL_TABLET | Freq: Two times a day (BID) | ORAL | 0 refills | Status: DC

## 2019-09-14 NOTE — Progress Notes (Signed)
CC: Lower extremity swelling, back pain  HPI:  [name] is a 85 y.o.  with a PMH listed below presenting for lower extremity swelling, abnormal labs, and back pain.    Please see A&P for status of the patient's chronic medical conditions  Past Medical History:  Diagnosis Date  . A-fib ([place])   . Abdominal aortic aneurysm (AAA) without rupture ([place]) 05/15/2018   Incidental finding on CT in 2012 Stable on ultrasound 2020  . Anemia 07/17/2018   He has a remote history of colon cancer with a colectomy at the [place].  No records available He had colonoscopy in 2015 with a 7-year follow-up recommended   . Atrial fibrillation ([place]) 04/02/2014  . Cancer ([place])   . Chronic kidney disease, stage 3, mod decreased GFR (H[facility]) 04/03/2014  . Diabetes mellitus without complication ([place])   . Dysphagia 02/10/2019  . Dysrhythmia   . Enlarged prostate 05/15/2018  . Gait abnormality 06/20/2018  . GI bleed 04/02/2014  . History of colon cancer 04/02/2014   Colonoscopy Dr. Deatra Ina July 2015 = 3 mm sessile polyp in the sigmoid colon.  Pathology showed tubular adenoma without high-grade dysplasia.  Surveillance colonoscopy 7 to 10 years.  . History of diabetes mellitus 02/03/2018  . Hypertension   . Left hip pain 04/14/2019  . Neuropathy 02/18/2018  . Sebaceous cyst 05/15/2018  . Seizure ([place]) 04/02/2014  . Seizures ([place])   . Systolic heart failure ([place]) 05/14/2019   Diagnosed August 2020 No ischemic work-up   Review of Systems: Refer to history of present illness and assessment and plans for pertinent review of systems, all others reviewed and negative.  Physical Exam:  Vitals:   09/14/19 1404  BP: (!) 102/49  Pulse: 63  Temp: 98.1 F (36.7 C)  TempSrc: Oral  SpO2: 99%  Weight: 161 lb 11.2 oz (73.3 kg)  Height: 5\' 10"  (1.778 m)    Physical Exam  Constitutional: He is oriented to person, place, and time.  Elderly male, frail appearing, sitting in wheel chair  HENT:  Head: Normocephalic and  atraumatic.  Neck: No thyromegaly present.  Cardiovascular: Normal rate, regular rhythm and normal heart sounds.  Pulmonary/Chest: Effort normal and breath sounds normal. No respiratory distress. He has no wheezes. He has no rales.  Abdominal: Soft. Bowel sounds are normal. He exhibits no distension.  Musculoskeletal:        General: Edema (2+ BL LE edema up to mid shin) present. Normal range of motion.     Cervical back: Normal range of motion and neck supple.  Neurological: He is alert and oriented to person, place, and time.  Skin: Skin is warm and dry. There is erythema.  Small papule measuring around 5 cm on back, fluctuate, some erythema around area, mild tenderness  Psychiatric: Mood and affect normal.      Social History   Socioeconomic History  . Marital status: Married    Spouse name: Not on file  . Number of children: Not on file  . Years of education: Not on file  . Highest education level: Not on file  Occupational History  . Not on file  Tobacco Use  . Smoking status: Former Smoker    Quit date: 09/24/1969    Years since quitting: 50.0  . Smokeless tobacco: Never Used  Substance and Sexual Activity  . Alcohol use: No  . Drug use: No  . Sexual activity: Not on file  Other Topics Concern  . Not on file  Social History Narrative  . Not on file   Social Determinants of Health   Financial Resource Strain:   . Difficulty of Paying Living Expenses: Not on file  Food Insecurity:   . Worried About Charity fundraiser in the Last Year: Not on file  . Ran Out of Food in the Last Year: Not on file  Transportation Needs:   . Lack of Transportation (Medical): Not on file  . Lack of Transportation (Non-Medical): Not on file  Physical Activity:   . Days of Exercise per Week: Not on file  . Minutes of Exercise per Session: Not on file  Stress:   . Feeling of Stress : Not on file  Social Connections:   . Frequency of Communication with Friends and Family: Not on file   . Frequency of Social Gatherings with Friends and Family: Not on file  . Attends Religious Services: Not on file  . Active Member of Clubs or Organizations: Not on file  . Attends Archivist Meetings: Not on file  . Marital Status: Not on file  Intimate Partner Violence:   . Fear of Current or Ex-Partner: Not on file  . Emotionally Abused: Not on file  . Physically Abused: Not on file  . Sexually Abused: Not on file    Family History  Problem Relation Age of Onset  . Diabetes Mellitus II Mother   . Prostate cancer Father   . Diabetes Mellitus II Brother     Assessment & Plan:   See Encounters Tab for problem based charting.  Patient discussed with Dr. Evette Doffing

## 2019-09-14 NOTE — Progress Notes (Signed)
Anticoagulation Management Andrew [name] is a 85 y.o. male who reports to the clinic for monitoring of warfarin treatment.    Indication: atrial fibrillation ; long term current use of anticoagulant.  Duration: indefinite Supervising physician: Lalla Brothers  Anticoagulation Clinic Visit History: Patient does not report signs/symptoms of bleeding or thromboembolism  Other recent changes: No diet, medications, lifestyle changes.  Anticoagulation Episode Summary    Current INR goal:  2.0-3.0  TTR:  84.9 % (2.2 y)  Next INR check:  11/09/2019  INR from last check:  2.3 (09/14/2019)  Weekly max warfarin dose:    Target end date:    INR check location:    Preferred lab:    Send INR reminders to:  ANT[facility]   Indications   Atrial fibrillation ([place]) [I48.91]       Comments:          Allergies  Allergen Reactions  . Tegretol [Carbamazepine] Other (See Comments)    Made patient's feet swell  . Depakote [Divalproex Sodium] Rash  . Dilantin [Phenytoin Sodium Extended] Rash  . Phenobarbital Rash    Current Outpatient Medications:  .  acetaminophen (TYLENOL) 325 MG tablet, Take 2 tablets (650 mg total) by mouth every 6 (six) hours as needed for mild pain (or Fever >/= 101)., Disp: 30 tablet, Rfl: 0 .  Cholecalciferol (VITAMIN D-3) 1000 units CAPS, Take 1,000 Units by mouth daily., Disp: , Rfl:  .  doxycycline (VIBRA-TABS) 100 MG tablet, Take 1 tablet (100 mg total) by mouth 2 (two) times daily., Disp: 10 tablet, Rfl: 0 .  fluticasone (FLONASE) 50 MCG/ACT nasal spray, Place 1 spray into both nostrils daily., Disp: 16 g, Rfl: 2 .  furosemide (LASIX) 20 MG tablet, Take 2 tablets (40 mg total) by mouth daily., Disp: 90 tablet, Rfl: 3 .  hydrocortisone cream 1 %, Apply 1 application topically every 4 (four) hours., Disp: , Rfl:  .  metoprolol tartrate (LOPRESSOR) 25 MG tablet, Take 12.5 mg by mouth at bedtime. , Disp: , Rfl:  .  Multiple Vitamins-Minerals (ONE-A-DAY MENS 50+ ADVANTAGE)  TABS, Take 1 tablet by mouth at bedtime., Disp: , Rfl:  .  vitamin B-12 (CYANOCOBALAMIN) 1000 MCG tablet, Take 1,000 mcg by mouth daily., Disp: , Rfl:  .  warfarin (COUMADIN) 6 MG tablet, TAKE 1 TAB ONCE-DAILY AT 6PM ON TUESDAYS AND FRIDAYS. TAKE 1/2 PILL ALL OTHER DAYS., Disp: 60 tablet, Rfl: 1 .  zonisamide (ZONEGRAN) 100 MG capsule, Take 300 mg by mouth 2 (two) times daily. MORNING and EVENING, Disp: , Rfl:  Past Medical History:  Diagnosis Date  . A-fib ([place])   . Abdominal aortic aneurysm (AAA) without rupture ([place]) 05/15/2018   Incidental finding on CT in 2012 Stable on ultrasound 2020  . Anemia 07/17/2018   He has a remote history of colon cancer with a colectomy at the [place].  No records available He had colonoscopy in 2015 with a 7-year follow-up recommended   . Atrial fibrillation ([place]) 04/02/2014  . Cancer ([place])   . Chronic kidney disease, stage 3, mod decreased GFR ([facility]) 04/03/2014  . Diabetes mellitus without complication ([place])   . Dysphagia 02/10/2019  . Dysrhythmia   . Enlarged prostate 05/15/2018  . Gait abnormality 06/20/2018  . GI bleed 04/02/2014  . History of colon cancer 04/02/2014   Colonoscopy Dr. Deatra Ina July 2015 = 3 mm sessile polyp in the sigmoid colon.  Pathology showed tubular adenoma without high-grade dysplasia.  Surveillance colonoscopy 7 to 10 years.  Marland Kitchen  History of diabetes mellitus 02/03/2018  . Hypertension   . Left hip pain 04/14/2019  . Neuropathy 02/18/2018  . Sebaceous cyst 05/15/2018  . Seizure ([place]) 04/02/2014  . Seizures ([place])   . Systolic heart failure ([place]) 05/14/2019   Diagnosed August 2020 No ischemic work-up   Social History   Socioeconomic History  . Marital status: Married    Spouse name: Not on file  . Number of children: Not on file  . Years of education: Not on file  . Highest education level: Not on file  Occupational History  . Not on file  Tobacco Use  . Smoking status: Former Smoker    Quit date: 09/24/1969    Years since quitting: 50.0   . Smokeless tobacco: Never Used  Substance and Sexual Activity  . Alcohol use: No  . Drug use: No  . Sexual activity: Not on file  Other Topics Concern  . Not on file  Social History Narrative  . Not on file   Social Determinants of Health   Financial Resource Strain:   . Difficulty of Paying Living Expenses: Not on file  Food Insecurity:   . Worried About Charity fundraiser in the Last Year: Not on file  . Ran Out of Food in the Last Year: Not on file  Transportation Needs:   . Lack of Transportation (Medical): Not on file  . Lack of Transportation (Non-Medical): Not on file  Physical Activity:   . Days of Exercise per Week: Not on file  . Minutes of Exercise per Session: Not on file  Stress:   . Feeling of Stress : Not on file  Social Connections:   . Frequency of Communication with Friends and Family: Not on file  . Frequency of Social Gatherings with Friends and Family: Not on file  . Attends Religious Services: Not on file  . Active Member of Clubs or Organizations: Not on file  . Attends Archivist Meetings: Not on file  . Marital Status: Not on file   Family History  Problem Relation Age of Onset  . Diabetes Mellitus II Mother   . Prostate cancer Father   . Diabetes Mellitus II Brother     ASSESSMENT Recent Results: The most recent result is correlated with 27 mg per week: Lab Results  Component Value Date   INR 2.3 09/14/2019   INR 2.3 05/14/2019   INR 2.9 04/06/2019    Anticoagulation Dosing: Description   Take 1 tablet of your teal 6mg  warfarin tablets by mouth once daily on Tuesdays and Fridays of each week, on all other days, take 1/2 tablet by mouth once daily.     INR today: Therapeutic  PLAN Weekly dose was unchanged. Will REMAIN on 27mg  warfarin per week.   Patient Instructions  Patient instructed to take medications as defined in the Anti-coagulation Track section of this encounter.  Patient instructed to take today's dose.   Patient instructed to take 1 tablet of your teal 6mg  warfarin tablets by mouth once daily on Tuesdays and Fridays of each week, on all other days, take 1/2 tablet by mouth once daily. Patient verbalized understanding of these instructions.    Patient advised to contact clinic or seek medical attention if signs/symptoms of bleeding or thromboembolism occur.  Patient verbalized understanding by repeating back information and was advised to contact me if further medication-related questions arise. Patient was also provided an information handout.  Follow-up Return in 8 weeks (on 11/09/2019) for Follow up  INR.  Pennie Banter, PharmD, CPP  15 minutes spent face-to-face with the patient during the encounter. 50% of time spent on education, including signs/sx bleeding and clotting, as well as food and drug interactions with warfarin. 50% of time was spent on fingerprick POC INR sample collection,processing, results determination, and documentation in http://www.kim.net/.

## 2019-09-14 NOTE — Patient Instructions (Signed)
[name],  It was a pleasure to see you today. Thank you for coming in.   Today we discussed your blood pressure and lower extremity swelling. In regards to this please increase your Lasix to 40 mg daily for now. We are checking your labs and will contact you with the results. Please continue to follow up with the Cardiologist next week.   In regards to the bump on your back, please start taking the antibiotic for the next 5 days. Please keep the area clean and dry, you can continue to cover it up with bandages. Please monitor for fevers, chills, worsening of the area, or worsening pain.    Please return to clinic in 1 month or sooner if needed.   Thank you again for coming in.   [name].D.

## 2019-09-14 NOTE — Patient Instructions (Signed)
Patient instructed to take medications as defined in the Anti-coagulation Track section of this encounter.  Patient instructed to take today's dose.  Patient instructed to take 1 tablet of your teal 6mg warfarin tablets by mouth once daily on Tuesdays and Fridays of each week, on all other days, take 1/2 tablet by mouth once daily. Patient verbalized understanding of these instructions.    

## 2019-09-15 LAB — BMP8+ANION GAP
Anion Gap: 15 mmol/L (ref 10.0–18.0)
BUN/Creatinine Ratio: 13 (ref 10–24)
BUN: 42 mg/dL — ABNORMAL HIGH (ref 8–27)
CO2: 22 mmol/L (ref 20–29)
Calcium: 9.2 mg/dL (ref 8.6–10.2)
Chloride: 105 mmol/L (ref 96–106)
Creatinine, Ser: 3.18 mg/dL — ABNORMAL HIGH (ref 0.76–1.27)
GFR calc Af Amer: 20 mL/min/{1.73_m2} — ABNORMAL LOW (ref >59)
GFR calc non Af Amer: 17 mL/min/{1.73_m2} — ABNORMAL LOW (ref >59)
Glucose: 93 mg/dL (ref 65–99)
Potassium: 5 mmol/L (ref 3.5–5.2)
Sodium: 142 mmol/L (ref 134–144)

## 2019-09-15 NOTE — Progress Notes (Signed)
INTERNAL MEDICINE TEACHING ATTENDING ADDENDUM ° °I agree with pharmacy recommendations as outlined in their note.  ° °-Hilmer Aliberti MD ° °

## 2019-09-15 NOTE — Assessment & Plan Note (Signed)
Patient has a history of newly diagnosed systolic heart failure shown on echocardiogram on 05/21/2019, EF of 35 to 40%, left diffuse hypokinesis, elevated right ventricular pressure elevated left atrial size.  Currently following with cardiology.  Patient was not having any anginal symptoms and per cardiology note there is extensive discussion about potential work-up for cardiomyopathy, it was decided that avoid further invasive testing.  Labs done on 08/19/2019 showed worsening of his kidney function, creatinine up to 3.62 from 1.82.  He was advised to stop taking the lisinopril and continue taking the Lasix 40 mg daily.  Patient reports that he is only taking Lasix 20 mg daily.  Today the daughter reports that his BP has been running low, around 93/45, 97/54, 101/62.  They report that his only symptom is feeling very cold. He denies any light headedness, dizziness, fatigue, shortness of breath, chest pain, headaches, or any other symptoms. He is currently on metoprolol 12.5 mg daily and lasix 20 mg daily.  On exam he is an elderly male, frail appearing, no acute distress, sitting in wheelchair, he does have 2+ BL LE swelling on exam today.  Patient does appear to be volume overloaded on exam today, will increase his Lasix back to 40 mg daily for now and repeat BMP.  All blood pressures have been running low patient is currently asymptomatic, will continue his current metoprolol dose for now.  -Increase lasix to 40 mg daily -Repeat BMP today -Continue metoprolol 12.5 mg daily -Follow-up with cardiology next week

## 2019-09-15 NOTE — Addendum Note (Signed)
Addended by: Lalla Brothers T on: 09/15/2019 01:33 PM   Modules accepted: Level of Service

## 2019-09-15 NOTE — Assessment & Plan Note (Signed)
Patient has a history of sebaceous cyst in his left upper extremity that has been chronic. He reports that about 1 month ago he started having a small bump that started swelling on his right upper back area, it started enlarging and became very painful and itchy. They report that the bump popped last night and they squeezed out a lot of pus. He is currently on Coumadin and the area started bleeding a lot. Denies any fevers, chills, nausea, vomiting, headaches, or light headedness. On exam he has a small papule on the upper back, measuring around 5 cm with some erythema surrounding the area, fluctuate in nature, some tenderness to palpation. It does seem to be consistent with a sebaceous cyst. Given the erythema, pain, and manipulation there is risk of developing cellulitis, will treat empirically with oral antibiotics for now.   -Doxycycline 500 mg BID x5 days -RTC in 1 month to follow up -Gave strict return precautions

## 2019-09-15 NOTE — Progress Notes (Signed)
Internal Medicine Clinic Attending  I saw and evaluated the patient.  I personally confirmed the key portions of the history and exam documented by Dr. Krienke and I reviewed pertinent patient test results.  The assessment, diagnosis, and plan were formulated together and I agree with the documentation in the resident's note.    

## 2019-09-21 ENCOUNTER — Encounter: Payer: Self-pay | Admitting: Cardiology

## 2019-09-21 ENCOUNTER — Other Ambulatory Visit: Payer: Self-pay

## 2019-09-21 ENCOUNTER — Ambulatory Visit (INDEPENDENT_AMBULATORY_CARE_PROVIDER_SITE_OTHER): Payer: Medicare Other | Admitting: Cardiology

## 2019-09-21 VITALS — BP 98/50 | HR 76 | Ht 70.0 in | Wt 158.8 lb

## 2019-09-21 DIAGNOSIS — I4821 Permanent atrial fibrillation: Secondary | ICD-10-CM

## 2019-09-21 DIAGNOSIS — N184 Chronic kidney disease, stage 4 (severe): Secondary | ICD-10-CM

## 2019-09-21 DIAGNOSIS — Z79899 Other long term (current) drug therapy: Secondary | ICD-10-CM

## 2019-09-21 DIAGNOSIS — N183 Chronic kidney disease, stage 3 unspecified: Secondary | ICD-10-CM | POA: Diagnosis not present

## 2019-09-21 DIAGNOSIS — I5021 Acute systolic (congestive) heart failure: Secondary | ICD-10-CM

## 2019-09-21 DIAGNOSIS — I5022 Chronic systolic (congestive) heart failure: Secondary | ICD-10-CM

## 2019-09-21 LAB — BASIC METABOLIC PANEL
BUN/Creatinine Ratio: 12 (ref 10–24)
BUN: 39 mg/dL — ABNORMAL HIGH (ref 8–27)
CO2: 24 mmol/L (ref 20–29)
Calcium: 9 mg/dL (ref 8.6–10.2)
Chloride: 105 mmol/L (ref 96–106)
Creatinine, Ser: 3.2 mg/dL — ABNORMAL HIGH (ref 0.76–1.27)
GFR calc Af Amer: 20 mL/min/{1.73_m2} — ABNORMAL LOW (ref >59)
GFR calc non Af Amer: 17 mL/min/{1.73_m2} — ABNORMAL LOW (ref >59)
Glucose: 107 mg/dL — ABNORMAL HIGH (ref 65–99)
Potassium: 4.2 mmol/L (ref 3.5–5.2)
Sodium: 143 mmol/L (ref 134–144)

## 2019-09-21 LAB — CBC
Hematocrit: 24.5 % — ABNORMAL LOW (ref 37.5–51.0)
Hemoglobin: 8 g/dL — ABNORMAL LOW (ref 13.0–17.7)
MCH: 30.2 pg (ref 26.6–33.0)
MCHC: 32.7 g/dL (ref 31.5–35.7)
MCV: 93 fL (ref 79–97)
Platelets: 198 10*3/uL (ref 150–450)
RBC: 2.65 x10E6/uL — CL (ref 4.14–5.80)
RDW: 13.1 % (ref 11.6–15.4)
WBC: 7.4 10*3/uL (ref 3.4–10.8)

## 2019-09-21 MED ORDER — FUROSEMIDE 20 MG PO TABS: 40.0000 mg | ORAL_TABLET | Freq: Every day | ORAL | 3 refills | Status: DC | PRN

## 2019-09-21 NOTE — Patient Instructions (Addendum)
Medication Instructions:  Your physician has recommended you make the following change in your medication:   1) Change your Furosemide to 40MG , 1 tablet by mouth as needed for swelling  *If you need a refill on your cardiac medications before your next appointment, please call your pharmacy*  Lab Work:  You will have labs drawn today: BMET and CBC  If you have labs (blood work) drawn today and your tests are completely normal, you will receive your results only by: Marland Kitchen MyChart Message (if you have MyChart) OR . A paper copy in the mail If you have any lab test that is abnormal or we need to change your treatment, we will call you to review the results.  Testing/Procedures:  Your physician has requested that you have a renal ultrasound. During this test, an ultrasound is used to evaluate blood flow to the kidneys. Allow one hour for this exam. Do not eat after midnight the day before and avoid carbonated beverages. Take your medications as you usually do.   Follow-Up: At [facility], you and your health needs are our priority.  As part of our continuing mission to provide you with exceptional heart care, we have created designated Provider Care Teams.  These Care Teams include your primary Cardiologist (physician) and Advanced Practice Providers (APPs -  Physician Assistants and Nurse Practitioners) who all work together to provide you with the care you need, when you need it.  Your next appointment:    On 10/29/19 at 2:40PM with Candee Furbish, MD

## 2019-09-21 NOTE — Addendum Note (Signed)
Addended by: Mady Haagensen on: 09/21/2019 10:04 AM   Modules accepted: Orders

## 2019-09-22 ENCOUNTER — Telehealth: Payer: Self-pay

## 2019-09-22 ENCOUNTER — Ambulatory Visit (HOSPITAL_COMMUNITY)
Admission: RE | Admit: 2019-09-22 | Discharge: 2019-09-22 | Disposition: A | Discharge status: Home / Self Care | Payer: Medicare Other | Source: Ambulatory Visit | Attending: Internal Medicine | Admitting: Internal Medicine

## 2019-09-22 DIAGNOSIS — N183 Chronic kidney disease, stage 3 unspecified: Secondary | ICD-10-CM | POA: Insufficient documentation

## 2019-09-22 NOTE — Telephone Encounter (Signed)
-----   Message from Tommie Raymond, NP sent at 09/22/2019  8:13 AM EST ----- Please let the patient know that his kidney function is still elevated which remains a concern. Please keep nephrology appointment. I am also concerned about his CBC with critical RBC count and low HB. He is on Coumadin therapy and I ordered a kidney ultrasound. Has this been performed? I would also recommend that he let his PCP know of these values as he may need a follow up appointment for his anemia.

## 2019-09-22 NOTE — Telephone Encounter (Signed)
Left VM on identified VM advising of lab results.  Pt scheduled for renal ultrasound today.  Lab results forwarded to PCP for review.

## 2019-10-22 ENCOUNTER — Ambulatory Visit: Payer: Medicare Other | Admitting: Internal Medicine

## 2019-10-27 ENCOUNTER — Other Ambulatory Visit: Payer: Self-pay

## 2019-10-27 ENCOUNTER — Ambulatory Visit (INDEPENDENT_AMBULATORY_CARE_PROVIDER_SITE_OTHER): Payer: Medicare Other | Admitting: Internal Medicine

## 2019-10-27 VITALS — BP 117/50 | HR 75 | Temp 98.1°F | Ht 70.0 in | Wt 170.7 lb

## 2019-10-27 DIAGNOSIS — N183 Chronic kidney disease, stage 3 unspecified: Secondary | ICD-10-CM

## 2019-10-27 DIAGNOSIS — R600 Localized edema: Secondary | ICD-10-CM | POA: Diagnosis not present

## 2019-10-27 DIAGNOSIS — Z79899 Other long term (current) drug therapy: Secondary | ICD-10-CM | POA: Diagnosis not present

## 2019-10-27 DIAGNOSIS — N400 Enlarged prostate without lower urinary tract symptoms: Secondary | ICD-10-CM

## 2019-10-27 DIAGNOSIS — I5020 Unspecified systolic (congestive) heart failure: Secondary | ICD-10-CM

## 2019-10-27 DIAGNOSIS — M160 Bilateral primary osteoarthritis of hip: Secondary | ICD-10-CM

## 2019-10-27 MED ORDER — LUMBAR BACK BRACE/SUPPORT PAD MISC: 1.0000 | 0 refills | Status: AC | PRN

## 2019-10-27 MED ORDER — FUROSEMIDE 20 MG PO TABS: 40.0000 mg | ORAL_TABLET | Freq: Every day | ORAL | 3 refills | Status: DC | PRN

## 2019-10-27 NOTE — Assessment & Plan Note (Signed)
Lower extremity edema: His daughter reports that he has been experiencing worsening lower extremity edema for "months."Both the patient and his daughter report that they had previously tried Lasix however this did not improve his lower extremity swelling.  The swelling is always constant and does not improve or worsen at the end of the day or early in the morning.  He was evaluated in the clinic on September 14, 2019 and after that visit, he was instructed to take Lasix 40 mg for the lower extremity edema.  He had follow-up with cardiology on September 21 2019 for which he was instructed to stop taking Lasix due to his renal function.  Given [name] ongoing lower extremity edema, he unfortunately had ground-level fall last Tuesday was evaluated by EMS for which she did not warrant hospitalization.  He gets around at home with a rolling glider  Today, [name] denies chest pain, shortness of breath, orthopnea, paroxysmal nocturnal dyspnea.  His ongoing lower extremity edema is certainly not cardiac in nature given his pertinent negative cardiac assessment.  Per chart review, he has had worsening renal function since August 2020.  Serum creatinine on 05/14/2019 was 1.8 however has worsened  to 3.2 (09/21/2019).  He has been following up with a nephrologist at the [facility] hospital and they have follow-up this Friday.   I wonder if he has a component of nephrotic syndrome versus venous congestion that has caused worsening renal function.  Plan: -Unna boots placed in the clinic today -Increase Lasix to 60 mg twice daily x3 days then 60 mg daily afterwards -Follow-up with your nephrologist this Friday -Return to clinic on November 08, 2018

## 2019-10-27 NOTE — Progress Notes (Signed)
   CC: Lower extremity edema  HPI:  [name] is a 86 y.o. community dwelling gentleman with medical history significant for heart failure with reduced ejection fraction (EF 35 to 40%), CKD stage III/IV, BPH here with her daughter with complaints of worsening lower extremity edema.  Please see problem based charting for further details.  Past Medical History:  Diagnosis Date  . A-fib ([place])   . Abdominal aortic aneurysm (AAA) without rupture ([place]) 05/15/2018   Incidental finding on CT in 2012 Stable on ultrasound 2020  . Anemia 07/17/2018   He has a remote history of colon cancer with a colectomy at the [place].  No records available He had colonoscopy in 2015 with a 7-year follow-up recommended   . Atrial fibrillation ([place]) 04/02/2014  . Cancer ([place])   . Chronic kidney disease, stage 3, mod decreased GFR ([facility]) 04/03/2014  . Diabetes mellitus without complication ([place])   . Dysphagia 02/10/2019  . Dysrhythmia   . Enlarged prostate 05/15/2018  . Gait abnormality 06/20/2018  . GI bleed 04/02/2014  . History of colon cancer 04/02/2014   Colonoscopy Dr. Deatra Ina July 2015 = 3 mm sessile polyp in the sigmoid colon.  Pathology showed tubular adenoma without high-grade dysplasia.  Surveillance colonoscopy 7 to 10 years.  . History of diabetes mellitus 02/03/2018  . Hypertension   . Left hip pain 04/14/2019  . Neuropathy 02/18/2018  . Sebaceous cyst 05/15/2018  . Seizure ([place]) 04/02/2014  . Seizures ([place])   . Systolic heart failure ([place]) 05/14/2019   Diagnosed August 2020 No ischemic work-up   Review of Systems:  As per HPI  Physical Exam:  Vitals:   10/27/19 1351  BP: (!) 117/50  Pulse: 75  Temp: 98.1 F (36.7 C)  TempSrc: Oral  SpO2: 100%  Weight: 170 lb 11.2 oz (77.4 kg)  Height: 5\' 10"  (1.778 m)   Physical Exam  Constitutional: He is well-developed, well-nourished, and in no distress.  HENT:  Head: Normocephalic and atraumatic.  Cardiovascular: Normal rate, regular rhythm and  normal heart sounds.  No murmur heard. Pulmonary/Chest: Effort normal and breath sounds normal.  Musculoskeletal:        General: Tenderness (3+ bilateral lower extremity edema) present.    Assessment & Plan:   See Encounters Tab for problem based charting.  Patient seen with Dr. Heber Hidalgo

## 2019-10-27 NOTE — Patient Instructions (Addendum)
1) Leg swelling -Increase Lasix to 120mg  twice a day for 3 days THEN once a day -Return to clinic on 11/09/2019 -Your legs will be wrapped today -Lab work and urinalysis today  -Please follow up with your kidney doctor   Take Care! Dr. Eileen Stanford  Please call the internal medicine center clinic if you have any questions or concerns, we may be able to help and keep you from a long and expensive emergency room wait. Our clinic and after hours phone number is [phone], the best time to call is Monday through Friday 9 am to 4 pm but there is always someone available 24/7 if you have an emergency. If you need medication refills please notify your pharmacy one week in advance and they will send [place] a request.

## 2019-10-27 NOTE — Assessment & Plan Note (Signed)
CKD stage III/IV: He is noted to have worsening renal function since August 2020.  Serum creatinine on 05/21/2019 was 1.8 with most recent lab work showing serum creatinine of 3.2.  He did have renal artery duplex performed by cardiology which did not show any evidence of hydronephrosis per I will personally review in the clinic today.  A bladder scan was performed showed 21 cc of urine.  He has been following up with the nephrologist at the [facility] who has made him aware that he is not a candidate for dialysis or renal transplant  Assessment: His worsening renal function could be either secondary to nephrotic syndrome or venous congestion from CHF though very less likely as he denies cardiac symptoms  Plan: -Increase Lasix to 60 mg twice daily x3 days then 60 mg daily afterwards -Follow-up with your nephrologist this Friday -Return to clinic on November 08, 2018 -Follow-up CMP, urinalysis

## 2019-10-27 NOTE — Progress Notes (Signed)
Internal Medicine Clinic Attending  I saw and evaluated the patient.  I personally confirmed the key portions of the history and exam documented by Dr. Eileen Stanford and I reviewed pertinent patient test results.  The assessment, diagnosis, and plan were formulated together and I agree with the documentation in the resident's note.     Patient does have significant peripheral edema.  Review of his chart shows that he has had some progressive decline in renal function.  He saw cardiology in December he was noted to be not making urine on 40 mg of Lasix daily his Lasix was discontinued at that time.  A ultrasound was ordered to evaluate for "obstruction" however it was a renal artery ultrasound.  I reviewed the images of that study I do not see any sign of hydronephrosis of either kidney kidneys appear normal size, bladder was not scanned.  He does have a history of prostate enlargement.  To complete his work-up I will obtain a urinalysis today I also obtain a bladder scan if his post void is abnormal he may need a formal renal ultrasound.  He does appear volume up and I think that the best course of action is actually a higher dose of diuretics.  We will obtain a CMP to evaluate his albumin and renal function.  I will have him take 60 mg of Lasix twice daily for the next 3 days and then continue with 60 mg of Lasix daily.  He does have a nephrologist who he follows with he is actually scheduled for a follow-up visit on Friday (this is the visit that was scheduled for January but had to be postponed due to inclement weather.)

## 2019-10-28 ENCOUNTER — Telehealth: Payer: Self-pay | Admitting: Internal Medicine

## 2019-10-28 LAB — CMP14 + ANION GAP
ALT: 7 IU/L (ref 0–44)
AST: 9 IU/L (ref 0–40)
Albumin/Globulin Ratio: 2.1 (ref 1.2–2.2)
Albumin: 3.8 g/dL (ref 3.6–4.6)
Alkaline Phosphatase: 67 IU/L (ref 39–117)
Anion Gap: 14 mmol/L (ref 10.0–18.0)
BUN/Creatinine Ratio: 16 (ref 10–24)
BUN: 41 mg/dL — ABNORMAL HIGH (ref 8–27)
Bilirubin Total: 0.2 mg/dL (ref 0.0–1.2)
CO2: 21 mmol/L (ref 20–29)
Calcium: 8.8 mg/dL (ref 8.6–10.2)
Chloride: 111 mmol/L — ABNORMAL HIGH (ref 96–106)
Creatinine, Ser: 2.64 mg/dL — ABNORMAL HIGH (ref 0.76–1.27)
GFR calc Af Amer: 25 mL/min/{1.73_m2} — ABNORMAL LOW (ref >59)
GFR calc non Af Amer: 21 mL/min/{1.73_m2} — ABNORMAL LOW (ref >59)
Globulin, Total: 1.8 g/dL (ref 1.5–4.5)
Glucose: 101 mg/dL — ABNORMAL HIGH (ref 65–99)
Potassium: 4.8 mmol/L (ref 3.5–5.2)
Sodium: 146 mmol/L — ABNORMAL HIGH (ref 134–144)
Total Protein: 5.6 g/dL — ABNORMAL LOW (ref 6.0–8.5)

## 2019-10-28 LAB — URINALYSIS, ROUTINE W REFLEX MICROSCOPIC
Bilirubin, UA: NEGATIVE
Glucose, UA: NEGATIVE
Ketones, UA: NEGATIVE
Leukocytes,UA: NEGATIVE
Nitrite, UA: NEGATIVE
Protein,UA: NEGATIVE
RBC, UA: NEGATIVE
Specific Gravity, UA: 1.016 (ref 1.005–1.030)
Urobilinogen, Ur: 0.2 mg/dL (ref 0.2–1.0)
pH, UA: 5 (ref 5.0–7.5)

## 2019-10-28 NOTE — Telephone Encounter (Signed)
Spoke to patient's daughter, [name] to discuss lab results.

## 2019-10-29 ENCOUNTER — Ambulatory Visit (INDEPENDENT_AMBULATORY_CARE_PROVIDER_SITE_OTHER): Payer: Medicare Other | Admitting: Cardiology

## 2019-10-29 ENCOUNTER — Other Ambulatory Visit: Payer: Self-pay

## 2019-10-29 ENCOUNTER — Encounter: Payer: Self-pay | Admitting: Cardiology

## 2019-10-29 ENCOUNTER — Telehealth: Payer: Self-pay | Admitting: Internal Medicine

## 2019-10-29 VITALS — BP 90/50 | HR 78 | Ht 70.0 in | Wt 169.0 lb

## 2019-10-29 DIAGNOSIS — Z79899 Other long term (current) drug therapy: Secondary | ICD-10-CM

## 2019-10-29 DIAGNOSIS — I129 Hypertensive chronic kidney disease with stage 1 through stage 4 chronic kidney disease, or unspecified chronic kidney disease: Secondary | ICD-10-CM

## 2019-10-29 DIAGNOSIS — I5022 Chronic systolic (congestive) heart failure: Secondary | ICD-10-CM | POA: Diagnosis not present

## 2019-10-29 DIAGNOSIS — N184 Chronic kidney disease, stage 4 (severe): Secondary | ICD-10-CM | POA: Diagnosis not present

## 2019-10-29 DIAGNOSIS — I714 Abdominal aortic aneurysm, without rupture, unspecified: Secondary | ICD-10-CM

## 2019-10-29 NOTE — Progress Notes (Signed)
Cardiology Office Note:    Date:  10/29/2019   ID:  LORENZO ARSCOTT, DOB 03/20/1934, MRN [id number]  PCP:  Bartholomew Crews, MD  Cardiologist:  Candee Furbish, MD  Electrophysiologist:  None   Referring MD: Bartholomew Crews, MD     History of Present Illness:    Andrew [name] is a 86 y.o. male here for the evaluation of systolic heart failure, discovered on echocardiogram 05/21/2019.  EF 35 to 40% diffuse hypokinesis with dilated left atrium.  Thyroid testing showed subclinical hypothyroidism, HIV negative.  Atrial fibrillation was also noted as well.  Referred to [place] to help determine the cause of cardiomyopathy.  Echocardiogram was ordered originally on 05/14/2019 office visit secondary to bilateral lower extremity edema.  Feet are quite swollen.  On lisinopril 10 mg a day and metoprolol 12.5 mg a day.  Low-dose Lasix 20 mg.  Has a history of AAA as well.  2.9 cm stable  Also has seizure history-followed at [place].  There was concern given his frailty that he would not necessarily be a candidate for further invasive evaluation.  Overall he is not complaining of any anginal symptoms, no chest pain, no significant shortness of breath.  He is tired at times.  He is here today in wheelchair with the help of his daughter.  His wife recently died of heart failure and was back-and-forth in the hospital multiple times.     10/29/2019 -Here for follow-up of systolic heart failure -Seems to be declining, leg issues with edema weeping present.  Early satiety noted. He has been losing weight.  Daughter here with him.  See below for details.     Past Medical History:  Diagnosis Date  . A-fib ([place])   . Abdominal aortic aneurysm (AAA) without rupture ([place]) 05/15/2018   Incidental finding on CT in 2012 Stable on ultrasound 2020  . Anemia 07/17/2018   He has a remote history of colon cancer with a colectomy at the [place].  No records available He had colonoscopy in 2015 with a 7-year follow-up  recommended   . Atrial fibrillation ([place]) 04/02/2014  . Cancer ([place])   . Chronic kidney disease, stage 3, mod decreased GFR (HCC) 04/03/2014  . Diabetes mellitus without complication ([place])   . Dysphagia 02/10/2019  . Dysrhythmia   . Enlarged prostate 05/15/2018  . Gait abnormality 06/20/2018  . GI bleed 04/02/2014  . History of colon cancer 04/02/2014   Colonoscopy Dr. Deatra Ina July 2015 = 3 mm sessile polyp in the sigmoid colon.  Pathology showed tubular adenoma without high-grade dysplasia.  Surveillance colonoscopy 7 to 10 years.  . History of diabetes mellitus 02/03/2018  . Hypertension   . Left hip pain 04/14/2019  . Neuropathy 02/18/2018  . Sebaceous cyst 05/15/2018  . Seizure ([place]) 04/02/2014  . Seizures ([place])   . Systolic heart failure ([place]) 05/14/2019   Diagnosed August 2020 No ischemic work-up    Past Surgical History:  Procedure Laterality Date  . APPENDECTOMY    . [place]  . COLON SURGERY    . COLONOSCOPY N/A 04/05/2014   Procedure: COLONOSCOPY;  Surgeon: Inda Castle, MD;  Location: [facility];  Service: Endoscopy;  Laterality: N/A;    Current Medications: Current Meds  Medication Sig  . acetaminophen (TYLENOL) 325 MG tablet Take 2 tablets (650 mg total) by mouth every 6 (six) hours as needed for mild pain (or Fever >/= 101).  . Cholecalciferol (VITAMIN D-3) 1000 units CAPS Take 1,000  Units by mouth daily.  Marland Kitchen doxycycline (VIBRA-TABS) 100 MG tablet Take 1 tablet (100 mg total) by mouth 2 (two) times daily.  Water engineer Bandages & Supports (LUMBAR BACK BRACE/SUPPORT PAD) MISC 1 each by Does not apply route as needed.  . fluticasone (FLONASE) 50 MCG/ACT nasal spray Place 1 spray into both nostrils daily.  . furosemide (LASIX) 20 MG tablet Take 20 mg by mouth 3 (three) times daily.  . hydrocortisone cream 1 % Apply 1 application topically every 4 (four) hours.  . metoprolol tartrate (LOPRESSOR) 25 MG tablet Take 12.5 mg by mouth at bedtime.   . Multiple  Vitamins-Minerals (ONE-A-DAY MENS 50+ ADVANTAGE) TABS Take 1 tablet by mouth at bedtime.  . vitamin B-12 (CYANOCOBALAMIN) 1000 MCG tablet Take 1,000 mcg by mouth daily.  Marland Kitchen warfarin (COUMADIN) 6 MG tablet TAKE 1 TAB ONCE-DAILY AT 6PM ON TUESDAYS AND FRIDAYS. TAKE 1/2 PILL ALL OTHER DAYS.  Marland Kitchen zonisamide (ZONEGRAN) 100 MG capsule Take 500 mg by mouth daily. 2 tabs in the morning, 3 tabs in the evening  (500 daily)     Allergies:   Tegretol [carbamazepine], Depakote [divalproex sodium], Dilantin [phenytoin sodium extended], and Phenobarbital   Social History   Socioeconomic History  . Marital status: Married    Spouse name: Not on file  . Number of children: Not on file  . Years of education: Not on file  . Highest education level: Not on file  Occupational History  . Not on file  Tobacco Use  . Smoking status: Former Smoker    Quit date: 09/24/1969    Years since quitting: 50.1  . Smokeless tobacco: Never Used  Substance and Sexual Activity  . Alcohol use: No  . Drug use: No  . Sexual activity: Not on file  Other Topics Concern  . Not on file  Social History Narrative  . Not on file   Social Determinants of Health   Financial Resource Strain:   . Difficulty of Paying Living Expenses: Not on file  Food Insecurity:   . Worried About Charity fundraiser in the Last Year: Not on file  . Ran Out of Food in the Last Year: Not on file  Transportation Needs:   . Lack of Transportation (Medical): Not on file  . Lack of Transportation (Non-Medical): Not on file  Physical Activity:   . Days of Exercise per Week: Not on file  . Minutes of Exercise per Session: Not on file  Stress:   . Feeling of Stress : Not on file  Social Connections:   . Frequency of Communication with Friends and Family: Not on file  . Frequency of Social Gatherings with Friends and Family: Not on file  . Attends Religious Services: Not on file  . Active Member of Clubs or Organizations: Not on file  . Attends  Archivist Meetings: Not on file  . Marital Status: Not on file     Family History: The patient's family history includes Diabetes Mellitus II in his brother and mother; Prostate cancer in his father.  ROS:   Please see the history of present illness.    No fevers chills nausea vomiting syncope bleeding all other systems reviewed and are negative.  EKGs/Labs/Other Studies Reviewed:    The following studies were reviewed today:  ECHO 05/21/19:  1. The left ventricle has moderately reduced systolic function, with an ejection fraction of 35-40%. The cavity size was normal. moderate basal septal hypertrophy. Left ventricular diastolic function could not  be evaluated secondary to atrial  fibrillation. Left ventricular diffuse hypokinesis.  2. The right ventricle has normal systolic function. The cavity was normal. There is no increase in right ventricular wall thickness. Right ventricular systolic pressure is moderately elevated.  3. Left atrial size was severely dilated.  4. Right atrial size was mildly dilated.  5. Mitral valve regurgitation is mild to moderate by color flow Doppler.  6. The aortic valve is tricuspid. Aortic valve regurgitation is mild by color flow Doppler.  7. The aorta is normal unless otherwise noted.  8. The inferior vena cava was dilated in size with <50% respiratory variability.  EKG: Atrial fibrillation rate controlled personally reviewed  Recent Labs: 05/14/2019: TSH 5.860 09/21/2019: Hemoglobin 8.0; Platelets 198 10/27/2019: ALT 7; BUN 41; Creatinine, Ser 2.64; Potassium 4.8; Sodium 146  Recent Lipid Panel No results found for: CHOL, TRIG, HDL, CHOLHDL, VLDL, LDLCALC, LDLDIRECT  Physical Exam:    VS:  BP (!) 90/50   Pulse 78   Ht 5\' 10"  (1.778 m)   Wt 169 lb (76.7 kg)   SpO2 99%   BMI 24.25 kg/m     Wt Readings from Last 3 Encounters:  10/29/19 169 lb (76.7 kg)  10/27/19 170 lb 11.2 oz (77.4 kg)  09/21/19 158 lb 12.8 oz (72 kg)     GEN:  In wheelchair elderly frail in no acute distress  HEENT: normal  Neck: Minimal JVD, no carotid bruits, or masses Cardiac: IRRR; no murmurs, rubs, or gallops, 3+ chronic lower extremity edema legs wrapped Respiratory:  clear to auscultation bilaterally, normal work of breathing GI: soft, nontender, nondistended, + BS MS: no deformity or atrophy  Skin: warm and dry, no rash Neuro:  Alert and Oriented x 3, Strength and sensation are intact Psych: euthymic mood, full affect   ASSESSMENT:    1. Chronic systolic heart failure ([place])   2. Medication management   3. AAA (abdominal aortic aneurysm) without rupture ([place])   4. Benign hypertension with chronic kidney disease, stage IV (HCC)    PLAN:    In order of problems listed above:  Chronic systolic heart failure/newly diagnosed cardiomyopathy/hypotension -EF 35 to 40%. -He did have longstanding hypertension previously and this may be the etiology. We had lengthy discussion about potential work-up for cardiomyopathy however I agree with Dr. Lynnae January and his daughter agrees as well that we should first do no harm and we will avoid further invasive testing, cardiac catheterization. His wife who recently died from heart failure went through quite a bit. -Currently taking 60 mg of Lasix twice daily. Legs spitting.  [facility] MD wants to change to torsemide. OK.  Clearly he has 10 to 15 pounds of fluid in his lower extremities.  His feet often are swelling.  1 to 1.5 L fluid restriction. -Since blood pressure is 114/68, we will continue with current low-dose ACE inhibitor and beta-blocker.  Given his chronic kidney disease, not a good candidate for Entresto or spironolactone. -In 2 to 3 months, we will repeat echocardiogram to see if there has been any improvement in his EF. - No significant SOB. Fell on 10/12/19 - 911, no breaks, unsteady. Neuropathy. BP been low. Freezing, so cold. T4 normal.  -Remote Health - legs splitting. 158 normal weight.    Permanent atrial fibrillation -EKG 05/27/2019 demonstrates atrial fibrillation.  Rate controlled.  Chronic anticoagulation -Continue with warfarin.  Weight loss -He is starting to lose his appetite more.  Legs are weaker.  Continuing to monitor.  We  were able to supply him with a scale.  End-of-life care -We discussed today DNR/DNI.  I am seeing him decline.  He has this order at home.  We also discussed the possibility of having the palliative care team come to his house to discuss options for him.  I think that he is very close if not already eligible for hospice.  His wife had hospice therapy however this was only for a few days.  His daughter is power of attorney and receptive to this.  40 minutes spent review of chart, discussion with patient, discussion with daughter.  3 month follow-up with Sharee Pimple  Medication Adjustments/Labs and Tests Ordered: Current medicines are reviewed at length with the patient today.  Concerns regarding medicines are outlined above.  No orders of the defined types were placed in this encounter.  No orders of the defined types were placed in this encounter.   There are no Patient Instructions on file for this visit.   Signed, Candee Furbish, MD  10/29/2019 3:52 PM    [facility]

## 2019-10-29 NOTE — Telephone Encounter (Signed)
Phone call placed to patient to offer to schedule a visit with [facility]. Phone rang, with no answer I left a voicemail for call back. 

## 2019-10-29 NOTE — Patient Instructions (Signed)
Medication Instructions:  The current medical regimen is effective;  continue present plan and medications.  *If you need a refill on your cardiac medications before your next appointment, please call your pharmacy*  Follow-Up: At [facility], you and your health needs are our priority.  As part of our continuing mission to provide you with exceptional heart care, we have created designated Provider Care Teams.  These Care Teams include your primary Cardiologist (physician) and Advanced Practice Providers (APPs -  Physician Assistants and Nurse Practitioners) who all work together to provide you with the care you need, when you need it.  Your next appointment:   3 month(s)  The format for your next appointment:   In Person  Provider:   Kathyrn Drown, NP   Thank you for choosing [facility]!!    You have been referred for Palliative care.  You will be contacted by phone regarding this.   Palliative Care Palliative care involves care of body, mind, and spirit in order to improve a person's quality of life. Palliative care services are offered to people dealing with serious and life-threatening illnesses, often in the hospital or in a long-term care setting. The specific services are different for each person and are based on the person's needs and preferences. Palliative care requires a team of people who ensure:  Control of pain and other symptoms.  Family support.  Spiritual support.  Emotional and social support.  Comfort. Palliative care is a way to bring comfort and peace of mind to a person and his or her family. It can have a positive impact on the person's quality of life and the course of the illness. What is the difference between palliative care and hospice? Palliative care and hospice have similar goals of managing symptoms, promoting comfort, improving quality of life, and maintaining a person's dignity. However, palliative care may be offered during any  phase of a serious illness, while hospice care is usually offered when a person is expected to live for 6 months or less. Who can receive palliative care services? Palliative care is offered to children and adults who are seriously ill. It is often offered in cases where a person:  Is not responding well to treatment.  Needs pain management.  Had treatment or surgery and developed symptoms that are difficult to manage.  Is undergoing a treatment to cure a condition (active treatment), like chemotherapy.  Has a diagnosis of an advanced disease, or a disease that shortens his or her life. A health care provider will usually recommend palliative care services when more support would be helpful. Family members and friends may also receive palliative care services to cope with stress and other concerns. Who makes up the palliative care team? The following people make up a palliative care team:  The person receiving care and his or her family.  Physicians, including primary health care providers and specialists.  Nurses.  A Education officer, museum. Depending on a person's needs, the following people may also be included on a palliative care team:  A pain specialist.  A hospice specialist.  A financial or Research officer, trade union.  Religious or spiritual leaders.  A care coordinator or case manager.  A bereavement coordinator. The team will talk with the person and his or her family about:  The role of the pain specialist and the hospice specialist.  The person's physical symptoms, such as pain, nausea, vomiting, and shortness of breath.  Stress, depression, and anxiety symptoms.  Function and mobility issues  and how to stay as active as possible.  Treatment options and how they may affect life.  Spiritual wishes, such as rituals and prayer.  Legacy and memory making activities.  Life and death as a normal process.  Advance directives or living wills, health care proxies, and  end-of-life care.  Any other concerns or issues. The palliative care team will make it okay to talk about difficult issues and topics. They will address spiritual and emotional concerns and give the person's preferences high importance. Summary  Palliative care involves care of body, mind, and spirit in order to improve a person's quality of life.  The specific services are different for each person and are based on the person's needs and preferences.  Palliative care is a way to bring comfort and peace of mind to a person and his or her family. This information is not intended to replace advice given to you by your health care provider. Make sure you discuss any questions you have with your health care provider. Document Revised: 10/08/2017 Document Reviewed: 11/26/2016 Elsevier Patient Education  [place].

## 2019-11-04 ENCOUNTER — Telehealth: Payer: Self-pay

## 2019-11-04 NOTE — Telephone Encounter (Signed)
Phone call placed to patient's daughter, [name], to introduce Palliative care and to offer to schedule visit with NP. Daughter receptive to this. Verbal consent obtained. Visit via Telehealth scheduled for 11/10/2019 @ 2pm with Stanton Kidney NP. Also, scheduled in person visit with Feliz Beam NP on 12/01/2019 @ 10 am

## 2019-11-09 ENCOUNTER — Ambulatory Visit (INDEPENDENT_AMBULATORY_CARE_PROVIDER_SITE_OTHER): Payer: Medicare Other | Admitting: Internal Medicine

## 2019-11-09 ENCOUNTER — Other Ambulatory Visit: Payer: Self-pay

## 2019-11-09 ENCOUNTER — Encounter: Payer: Self-pay | Admitting: Internal Medicine

## 2019-11-09 ENCOUNTER — Ambulatory Visit (INDEPENDENT_AMBULATORY_CARE_PROVIDER_SITE_OTHER): Payer: Medicare Other

## 2019-11-09 VITALS — BP 127/59 | HR 53 | Temp 98.4°F

## 2019-11-09 DIAGNOSIS — I130 Hypertensive heart and chronic kidney disease with heart failure and stage 1 through stage 4 chronic kidney disease, or unspecified chronic kidney disease: Secondary | ICD-10-CM | POA: Diagnosis not present

## 2019-11-09 DIAGNOSIS — R600 Localized edema: Secondary | ICD-10-CM | POA: Diagnosis not present

## 2019-11-09 DIAGNOSIS — Z7901 Long term (current) use of anticoagulants: Secondary | ICD-10-CM | POA: Diagnosis not present

## 2019-11-09 DIAGNOSIS — N183 Chronic kidney disease, stage 3 unspecified: Secondary | ICD-10-CM | POA: Diagnosis not present

## 2019-11-09 DIAGNOSIS — N184 Chronic kidney disease, stage 4 (severe): Secondary | ICD-10-CM | POA: Diagnosis not present

## 2019-11-09 DIAGNOSIS — I4891 Unspecified atrial fibrillation: Secondary | ICD-10-CM | POA: Diagnosis not present

## 2019-11-09 DIAGNOSIS — I5020 Unspecified systolic (congestive) heart failure: Secondary | ICD-10-CM

## 2019-11-09 DIAGNOSIS — Z5181 Encounter for therapeutic drug level monitoring: Secondary | ICD-10-CM

## 2019-11-09 DIAGNOSIS — Z79899 Other long term (current) drug therapy: Secondary | ICD-10-CM | POA: Diagnosis not present

## 2019-11-09 LAB — POCT INR: INR: 2.8 (ref 2.0–3.0)

## 2019-11-09 MED ORDER — WARFARIN SODIUM 6 MG PO TABS: ORAL_TABLET | ORAL | 1 refills | Status: DC

## 2019-11-09 NOTE — Patient Instructions (Signed)
Patient instructed to take medications as defined in the Anti-coagulation Track section of this encounter.  Patient instructed to take today's dose.  Patient instructed to take 1 tablet of your teal 6mg warfarin tablets by mouth once daily on Tuesdays and Fridays of each week, on all other days, take 1/2 tablet by mouth once daily. Patient verbalized understanding of these instructions.    

## 2019-11-09 NOTE — Patient Instructions (Signed)
Mr. Anguiano,   Thanks for seeing [place] today. I'm glad your leg swelling is a little better today. As we discussed, I would encourage you to use the compression stockings now. Switch the Lasix to torsemide as the nephrologist recommended.   I am getting repeat blood work today.   Take care! Dr. Eileen Stanford  Please call the internal medicine center clinic if you have any questions or concerns, we may be able to help and keep you from a long and expensive emergency room wait. Our clinic and after hours phone number is [phone], the best time to call is Monday through Friday 9 am to 4 pm but there is always someone available 24/7 if you have an emergency. If you need medication refills please notify your pharmacy one week in advance and they will send [place] a request.
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
## 2019-11-09 NOTE — Assessment & Plan Note (Signed)
Lower extremity edema: I personally saw Andrew [name] on 10/27/2019 for lower extremity edema.  He has had progressive lower extremity edema since December without chest pain, shortness of breath, orthopnea or PND.  Prio evaluation has ruled out cardiac etiology for his lower extremity edema and most likely related to this rapidly progressively worsening renal function.  During my last visit, he was instructed to increase his Lasix to 60 mg 3 times a day for 3 days and then 60 mg daily afterwards, Unna boots were placed as well.  He was also instructed to follow-up with his nephrologist.  His daughter tells me that his nephrologist has made him aware that he is currently not a candidate for dialysis.    Today, Andrew [name] and his daughter tells me that they did follow-up with his nephrologist who had recommended transitioning Lasix to torsemide 20 mg daily due to pill burden.  He continues to deny any cardiopulmonary symptoms.  They were told by the nephrologist that his renal function was improving.  The daughter also makes me aware that his cardiologist had placed palliative medicine consult.  On physical exams, his bilateral lower extremity is just a little better when that Unna boots were removed.  Plan: -Advised daughter to try compression stockings for now -BMP today -Transition Lasix 60mg  daily to Torsemide 20mg  daily

## 2019-11-09 NOTE — Progress Notes (Signed)
   CC: Follow-up CKD stage IV, lower extremity edema  HPI:  Andrew [name] is a 86 y.o. community dwelling gentleman with medical history significant for CKD stage IV, HFrEF with EF 35-40% who is following up for lower extremity edema.  Please see problem based charting for further details.  Past Medical History:  Diagnosis Date  . A-fib ([place])   . Abdominal aortic aneurysm (AAA) without rupture ([place]) 05/15/2018   Incidental finding on CT in 2012 Stable on ultrasound 2020  . Anemia 07/17/2018   He has a remote history of colon cancer with a colectomy at the [place].  No records available He had colonoscopy in 2015 with a 7-year follow-up recommended   . Atrial fibrillation ([place]) 04/02/2014  . Cancer ([place])   . Chronic kidney disease, stage 3, mod decreased GFR (HCC) 04/03/2014  . Diabetes mellitus without complication ([place])   . Dysphagia 02/10/2019  . Dysrhythmia   . Enlarged prostate 05/15/2018  . Gait abnormality 06/20/2018  . GI bleed 04/02/2014  . History of colon cancer 04/02/2014   Colonoscopy Dr. Deatra Ina July 2015 = 3 mm sessile polyp in the sigmoid colon.  Pathology showed tubular adenoma without high-grade dysplasia.  Surveillance colonoscopy 7 to 10 years.  . History of diabetes mellitus 02/03/2018  . Hypertension   . Left hip pain 04/14/2019  . Neuropathy 02/18/2018  . Sebaceous cyst 05/15/2018  . Seizure ([place]) 04/02/2014  . Seizures ([place])   . Systolic heart failure ([place]) 05/14/2019   Diagnosed August 2020 No ischemic work-up   Review of Systems:  As per HPI  Physical Exam:  Vitals:   11/09/19 1012  BP: (!) 127/59  Pulse: (!) 53  Temp: 98.4 F (36.9 C)  TempSrc: Oral  SpO2: 98%   Physical Exam  Constitutional:  Frail older gentleman  Cardiovascular: Normal heart sounds.  No murmur heard. Pulmonary/Chest: Effort normal and breath sounds normal. No respiratory distress. He has no wheezes.  Musculoskeletal:        General: Edema (1-2+ pitting bilaterally ([place]))  present.    Assessment & Plan:   See Encounters Tab for problem based charting.  Patient discussed with Dr. Philipp Ovens

## 2019-11-09 NOTE — Progress Notes (Signed)
Anticoagulation Management Andrew [name] is a 86 y.o. male who reports to the clinic for monitoring of warfarin treatment.    Indication: atrial fibrillation  Duration: indefinite Supervising physician: [place] [facility] Visit History: Patient does not report signs/symptoms of bleeding or thromboembolism.  Other recent changes: No medications or lifestyle changes but does report eating less dark leafy greens recently and maintaining a low sodium diet. Counseled on maintaining a consistent diet of dark leafy greens and patient and daughter amendable.  Anticoagulation Episode Summary    Current INR goal:  2.0-3.0  TTR:  85.9 % (2.3 y)  Next INR check:  12/21/2019  INR from last check:  2.8 (11/09/2019)  Weekly max warfarin dose:    Target end date:    INR check location:    Preferred lab:    Send INR reminders to:  ANTICOAG IMP   Indications   Atrial fibrillation ([place]) [I48.91]       Comments:          Allergies  Allergen Reactions  . Tegretol [Carbamazepine] Other (See Comments)    Made patient's feet swell  . Depakote [Divalproex Sodium] Rash  . Dilantin [Phenytoin Sodium Extended] Rash  . Phenobarbital Rash    Current Outpatient Medications:  .  acetaminophen (TYLENOL) 325 MG tablet, Take 2 tablets (650 mg total) by mouth every 6 (six) hours as needed for mild pain (or Fever >/= 101)., Disp: 30 tablet, Rfl: 0 .  Cholecalciferol (VITAMIN D-3) 1000 units CAPS, Take 1,000 Units by mouth daily., Disp: , Rfl:  .  doxycycline (VIBRA-TABS) 100 MG tablet, Take 1 tablet (100 mg total) by mouth 2 (two) times daily., Disp: 10 tablet, Rfl: 0 .  Elastic Bandages & Supports (LUMBAR BACK BRACE/SUPPORT PAD) MISC, 1 each by Does not apply route as needed., Disp: 1 each, Rfl: 0 .  fluticasone (FLONASE) 50 MCG/ACT nasal spray, Place 1 spray into both nostrils daily., Disp: 16 g, Rfl: 2 .  furosemide (LASIX) 20 MG tablet, Take 20 mg by mouth 3 (three) times daily., Disp:  , Rfl:  .  hydrocortisone cream 1 %, Apply 1 application topically every 4 (four) hours., Disp: , Rfl:  .  metoprolol tartrate (LOPRESSOR) 25 MG tablet, Take 12.5 mg by mouth at bedtime. , Disp: , Rfl:  .  Multiple Vitamins-Minerals (ONE-A-DAY MENS 50+ ADVANTAGE) TABS, Take 1 tablet by mouth at bedtime., Disp: , Rfl:  .  vitamin B-12 (CYANOCOBALAMIN) 1000 MCG tablet, Take 1,000 mcg by mouth daily., Disp: , Rfl:  .  warfarin (COUMADIN) 6 MG tablet, TAKE 1 TAB ONCE-DAILY AT 6PM ON TUESDAYS AND FRIDAYS. TAKE 1/2 PILL ALL OTHER DAYS., Disp: 60 tablet, Rfl: 1 .  zonisamide (ZONEGRAN) 100 MG capsule, Take 500 mg by mouth daily. 2 tabs in the morning, 3 tabs in the evening  (500 daily), Disp: , Rfl:  Past Medical History:  Diagnosis Date  . A-fib ([place])   . Abdominal aortic aneurysm (AAA) without rupture ([place]) 05/15/2018   Incidental finding on CT in 2012 Stable on ultrasound 2020  . Anemia 07/17/2018   He has a remote history of colon cancer with a colectomy at the [place].  No records available He had colonoscopy in 2015 with a 7-year follow-up recommended   . Atrial fibrillation ([place]) 04/02/2014  . Cancer ([place])   . Chronic kidney disease, stage 3, mod decreased GFR (HCC) 04/03/2014  . Diabetes mellitus without complication ([place])   . Dysphagia 02/10/2019  . Dysrhythmia   .  Enlarged prostate 05/15/2018  . Gait abnormality 06/20/2018  . GI bleed 04/02/2014  . History of colon cancer 04/02/2014   Colonoscopy Dr. Deatra Ina July 2015 = 3 mm sessile polyp in the sigmoid colon.  Pathology showed tubular adenoma without high-grade dysplasia.  Surveillance colonoscopy 7 to 10 years.  . History of diabetes mellitus 02/03/2018  . Hypertension   . Left hip pain 04/14/2019  . Neuropathy 02/18/2018  . Sebaceous cyst 05/15/2018  . Seizure ([place]) 04/02/2014  . Seizures ([place])   . Systolic heart failure ([place]) 05/14/2019   Diagnosed August 2020 No ischemic work-up   Social History   Socioeconomic History  . Marital status:  Married    Spouse name: Not on file  . Number of children: Not on file  . Years of education: Not on file  . Highest education level: Not on file  Occupational History  . Not on file  Tobacco Use  . Smoking status: Former Smoker    Quit date: 09/24/1969    Years since quitting: 50.1  . Smokeless tobacco: Never Used  Substance and Sexual Activity  . Alcohol use: No  . Drug use: No  . Sexual activity: Not on file  Other Topics Concern  . Not on file  Social History Narrative  . Not on file   Social Determinants of Health   Financial Resource Strain:   . Difficulty of Paying Living Expenses: Not on file  Food Insecurity:   . Worried About Charity fundraiser in the Last Year: Not on file  . Ran Out of Food in the Last Year: Not on file  Transportation Needs:   . Lack of Transportation (Medical): Not on file  . Lack of Transportation (Non-Medical): Not on file  Physical Activity:   . Days of Exercise per Week: Not on file  . Minutes of Exercise per Session: Not on file  Stress:   . Feeling of Stress : Not on file  Social Connections:   . Frequency of Communication with Friends and Family: Not on file  . Frequency of Social Gatherings with Friends and Family: Not on file  . Attends Religious Services: Not on file  . Active Member of Clubs or Organizations: Not on file  . Attends Archivist Meetings: Not on file  . Marital Status: Not on file   Family History  Problem Relation Age of Onset  . Diabetes Mellitus II Mother   . Prostate cancer Father   . Diabetes Mellitus II Brother     ASSESSMENT Recent Results: The most recent result is correlated with 27 mg per week: Lab Results  Component Value Date   INR 2.8 11/09/2019   INR 2.3 09/14/2019   INR 2.3 05/14/2019    Anticoagulation Dosing: Description   Take 1 tablet of your teal 6mg  warfarin tablets by mouth once daily on Tuesdays and Fridays of each week, on all other days, take 1/2 tablet by mouth  once daily.     INR today: Therapeutic  PLAN Weekly dose was unchanged at 27 mg per week  Patient Instructions  Patient instructed to take medications as defined in the Anti-coagulation Track section of this encounter.  Patient instructed to take today's dose.  Patient instructed to take 1 tablet of your teal 6mg  warfarin tablets by mouth once daily on Tuesdays and Fridays of each week, on all other days, take 1/2 tablet by mouth once daily. Patient verbalized understanding of these instructions.     Patient advised  to contact clinic or seek medical attention if signs/symptoms of bleeding or thromboembolism occur.  Patient verbalized understanding by repeating back information and was advised to contact me if further medication-related questions arise. Patient was also provided an information handout.  Follow-up Return in 6 weeks (on 12/21/2019) for Follow up INR at 1000.  Cristela Felt, PharmD PGY1 Pharmacy Resident [employer]: [phone]  15 minutes spent face-to-face with the patient during the encounter. 50% of time spent on education, including signs/sx bleeding and clotting, as well as food and drug interactions with warfarin. 50% of time was spent on fingerprick POC INR sample collection,processing, results determination, and documentation in http://www.kim.net/.

## 2019-11-10 ENCOUNTER — Encounter: Payer: Self-pay | Admitting: Internal Medicine

## 2019-11-10 ENCOUNTER — Other Ambulatory Visit: Payer: Medicare Other | Admitting: Internal Medicine

## 2019-11-10 DIAGNOSIS — Z515 Encounter for palliative care: Secondary | ICD-10-CM | POA: Diagnosis not present

## 2019-11-10 DIAGNOSIS — Z7189 Other specified counseling: Secondary | ICD-10-CM | POA: Diagnosis not present

## 2019-11-10 LAB — BMP8+ANION GAP
Anion Gap: 14 mmol/L (ref 10.0–18.0)
BUN/Creatinine Ratio: 14 (ref 10–24)
BUN: 38 mg/dL — ABNORMAL HIGH (ref 8–27)
CO2: 24 mmol/L (ref 20–29)
Calcium: 9 mg/dL (ref 8.6–10.2)
Chloride: 104 mmol/L (ref 96–106)
Creatinine, Ser: 2.68 mg/dL — ABNORMAL HIGH (ref 0.76–1.27)
GFR calc Af Amer: 24 mL/min/{1.73_m2} — ABNORMAL LOW (ref >59)
GFR calc non Af Amer: 21 mL/min/{1.73_m2} — ABNORMAL LOW (ref >59)
Glucose: 102 mg/dL — ABNORMAL HIGH (ref 65–99)
Potassium: 5.3 mmol/L — ABNORMAL HIGH (ref 3.5–5.2)
Sodium: 142 mmol/L (ref 134–144)

## 2019-11-11 NOTE — Progress Notes (Signed)
Internal Medicine Clinic Attending  Case discussed with Dr. Agyei at the time of the visit.  We reviewed the resident's history and exam and pertinent patient test results.  I agree with the assessment, diagnosis, and plan of care documented in the resident's note.    

## 2019-11-11 NOTE — Progress Notes (Signed)
INTERNAL MEDICINE TEACHING ATTENDING ADDENDUM   I agree with pharmacy recommendations as outlined in their note.   Amauris Debois, MD  

## 2019-12-01 ENCOUNTER — Other Ambulatory Visit: Payer: Medicare Other | Admitting: Internal Medicine

## 2019-12-01 ENCOUNTER — Other Ambulatory Visit: Payer: Self-pay

## 2019-12-21 ENCOUNTER — Ambulatory Visit (INDEPENDENT_AMBULATORY_CARE_PROVIDER_SITE_OTHER): Payer: Medicare Other | Admitting: Pharmacist

## 2019-12-21 DIAGNOSIS — Z5181 Encounter for therapeutic drug level monitoring: Secondary | ICD-10-CM | POA: Diagnosis not present

## 2019-12-21 DIAGNOSIS — Z7901 Long term (current) use of anticoagulants: Secondary | ICD-10-CM

## 2019-12-21 DIAGNOSIS — I4891 Unspecified atrial fibrillation: Secondary | ICD-10-CM

## 2019-12-21 LAB — POCT INR: INR: 2.4 (ref 2.0–3.0)

## 2019-12-21 NOTE — Progress Notes (Signed)
Anticoagulation Management Andrew [name] is a 86 y.o. male who reports to the clinic for monitoring of warfarin treatment.    Indication: atrial fibrillation; Long term current use of anticoagulant warfarin.  Duration: indefinite Supervising physician: Addison Naegeli, MD  Anticoagulation Clinic Visit History: Patient does not report signs/symptoms of bleeding or thromboembolism Other recent changes: diet, medications, lifestyle, none indicated at this visit.  Anticoagulation Episode Summary    Current INR goal:  2.0-3.0  TTR:  86.6 % (2.4 y)  Next INR check:  02/01/2020  INR from last check:  2.4 (12/21/2019)  Weekly max warfarin dose:    Target end date:    INR check location:    Preferred lab:    Send INR reminders to:  [facility]   Indications   Atrial fibrillation ([place]) [I48.91]       Comments:          Allergies  Allergen Reactions  . Tegretol [Carbamazepine] Other (See Comments)    Made patient's feet swell  . Depakote [Divalproex Sodium] Rash  . Dilantin [Phenytoin Sodium Extended] Rash  . Phenobarbital Rash    Current Outpatient Medications:  .  acetaminophen (TYLENOL) 325 MG tablet, Take 2 tablets (650 mg total) by mouth every 6 (six) hours as needed for mild pain (or Fever >/= 101)., Disp: 30 tablet, Rfl: 0 .  Cholecalciferol (VITAMIN D-3) 1000 units CAPS, Take 1,000 Units by mouth daily., Disp: , Rfl:  .  Elastic Bandages & Supports (LUMBAR BACK BRACE/SUPPORT PAD) MISC, 1 each by Does not apply route as needed., Disp: 1 each, Rfl: 0 .  fluticasone (FLONASE) 50 MCG/ACT nasal spray, Place 1 spray into both nostrils daily., Disp: 16 g, Rfl: 2 .  hydrocortisone cream 1 %, Apply 1 application topically every 4 (four) hours., Disp: , Rfl:  .  metoprolol tartrate (LOPRESSOR) 25 MG tablet, Take 12.5 mg by mouth at bedtime. , Disp: , Rfl:  .  Multiple Vitamins-Minerals (ONE-A-DAY MENS 50+ ADVANTAGE) TABS, Take 1 tablet by mouth at bedtime., Disp: , Rfl:  .  torsemide  (DEMADEX) 20 MG tablet, Take 20 mg by mouth daily., Disp: , Rfl:  .  vitamin B-12 (CYANOCOBALAMIN) 1000 MCG tablet, Take 1,000 mcg by mouth daily., Disp: , Rfl:  .  warfarin (COUMADIN) 6 MG tablet, TAKE 1 TAB ONCE-DAILY AT 6PM ON TUESDAYS AND FRIDAYS. TAKE 1/2 PILL ALL OTHER DAYS., Disp: 60 tablet, Rfl: 1 .  zonisamide (ZONEGRAN) 100 MG capsule, Take 500 mg by mouth daily. 2 tabs in the morning, 3 tabs in the evening  (500 daily), Disp: , Rfl:  Past Medical History:  Diagnosis Date  . A-fib ([place])   . Abdominal aortic aneurysm (AAA) without rupture ([place]) 05/15/2018   Incidental finding on CT in 2012 Stable on ultrasound 2020  . Anemia 07/17/2018   He has a remote history of colon cancer with a colectomy at the [place].  No records available He had colonoscopy in 2015 with a 7-year follow-up recommended   . Atrial fibrillation ([place]) 04/02/2014  . Cancer ([place])   . Chronic kidney disease, stage 3, mod decreased GFR (HCC) 04/03/2014  . Diabetes mellitus without complication ([place])   . Dysphagia 02/10/2019  . Dysrhythmia   . Enlarged prostate 05/15/2018  . Gait abnormality 06/20/2018  . GI bleed 04/02/2014  . History of colon cancer 04/02/2014   Colonoscopy Dr. Deatra Ina July 2015 = 3 mm sessile polyp in the sigmoid colon.  Pathology showed tubular adenoma without high-grade dysplasia.  Surveillance colonoscopy 7 to 10 years.  . History of diabetes mellitus 02/03/2018  . Hypertension   . Left hip pain 04/14/2019  . Neuropathy 02/18/2018  . Sebaceous cyst 05/15/2018  . Seizure ([place]) 04/02/2014  . Seizures ([place])   . Systolic heart failure ([place]) 05/14/2019   Diagnosed August 2020 No ischemic work-up   Social History   Socioeconomic History  . Marital status: Married    Spouse name: Not on file  . Number of children: Not on file  . Years of education: Not on file  . Highest education level: Not on file  Occupational History  . Not on file  Tobacco Use  . Smoking status: Former Smoker    Quit date:  09/24/1969    Years since quitting: 50.2  . Smokeless tobacco: Never Used  Substance and Sexual Activity  . Alcohol use: No  . Drug use: No  . Sexual activity: Not on file  Other Topics Concern  . Not on file  Social History Narrative  . Not on file   Social Determinants of Health   Financial Resource Strain:   . Difficulty of Paying Living Expenses:   Food Insecurity:   . Worried About Charity fundraiser in the Last Year:   . Arboriculturist in the Last Year:   Transportation Needs:   . Film/video editor (Medical):   Marland Kitchen Lack of Transportation (Non-Medical):   Physical Activity:   . Days of Exercise per Week:   . Minutes of Exercise per Session:   Stress:   . Feeling of Stress :   Social Connections:   . Frequency of Communication with Friends and Family:   . Frequency of Social Gatherings with Friends and Family:   . Attends Religious Services:   . Active Member of Clubs or Organizations:   . Attends Archivist Meetings:   Marland Kitchen Marital Status:    Family History  Problem Relation Age of Onset  . Diabetes Mellitus II Mother   . Prostate cancer Father   . Diabetes Mellitus II Brother     ASSESSMENT Recent Results: The most recent result is correlated with No  mg per week: Lab Results  Component Value Date   INR 2.4 12/21/2019   INR 2.8 11/09/2019   INR 2.3 09/14/2019    Anticoagulation Dosing: Description   Take 1 tablet of your teal 6mg  warfarin tablets by mouth once daily on Tuesdays and Fridays of each week, on all other days, take 1/2 tablet by mouth once daily.     INR today: Therapeutic  PLAN Weekly dose was unchanged. Remains on 27mg  warfarin/week.   Patient Instructions  Patient instructed to take medications as defined in the Anti-coagulation Track section of this encounter.  Patient instructed to take today's dose.  Patient instructed to take 1 tablet of your teal 6mg  warfarin tablets by mouth once daily on Tuesdays and Fridays of  each week, on all other days, take 1/2 tablet by mouth once daily. Patient verbalized understanding of these instructions.    Patient advised to contact clinic or seek medical attention if signs/symptoms of bleeding or thromboembolism occur.  Patient verbalized understanding by repeating back information and was advised to contact me if further medication-related questions arise. Patient was also provided an information handout.  Follow-up Return in 6 weeks (on 02/01/2020) for Follow up INR.  Pennie Banter, PharmD, CPP  15 minutes spent face-to-face with the patient during the encounter. 50% of time spent  on education, including signs/sx bleeding and clotting, as well as food and drug interactions with warfarin. 50% of time was spent on fingerprick POC INR sample collection,processing, results determination, and documentation in http://www.kim.net/.

## 2019-12-21 NOTE — Progress Notes (Signed)
INTERNAL MEDICINE TEACHING ATTENDING ADDENDUM   I agree with pharmacy recommendations as outlined in their note.   Charan Prieto, MD  

## 2019-12-21 NOTE — Patient Instructions (Signed)
Patient instructed to take medications as defined in the Anti-coagulation Track section of this encounter.  Patient instructed to take today's dose.  Patient instructed to take 1 tablet of your teal 6mg warfarin tablets by mouth once daily on Tuesdays and Fridays of each week, on all other days, take 1/2 tablet by mouth once daily. Patient verbalized understanding of these instructions.    

## 2020-01-17 NOTE — Progress Notes (Signed)
Cardiology Office Note   Date:  01/26/2020   ID:  Andrew [name], DOB 04-23-1934, MRN [id number]  PCP:  Bartholomew Crews, MD  Cardiologist: Dr. Candee Furbish, MD  Chief Complaint  Patient presents with  . Follow-up    History of Present Illness: Andrew [name] is a 86 y.o. male who presents for follow-up, seen for Dr. Marlou Porch.  Andrew [name] has a history of systolic HF discovered on echocardiogram 05/21/2019 with an LVEF of 35 to 40% with diffuse hypokinesis and dilated left atrium as well as atrial fibrillation,AAA measured at 2.9 cm and seizure disorder follow-up at [place].  Echocardiogram was initially ordered 05/14/2019 during an office visit secondary to bilateral lower extremity edema.Given results, there was concern for further invasive evaluation given his frailty. This was discussed with Dr. Marlou Porch, the patient and his daughter in which they all agreed that further invasive testing would not be highly beneficial given the above. Diuretics were adjusted. Given kidney disease, noted to not be a good candidate for Entresto or spironolactone.  Had issues with worsening creatinine after diuretic increase due to lower extremity edema therefore his lisinopril was discontinued and repeat lab work was performed 08/31/2019 with only moderate improvement in creatinine at 3.37.   His Lasix was reduced to 20 mg p.o. daily given this, instructions were given to the patient to   He now follows with nephrology at the [facility] as well as closely with his PCP.  He was then seen by Dr. Marlou Porch 10/29/2019 and seemed to be declining.  Continued to have issues with lower extremity edema with weeping and early satiety with weight loss.  Given his persistent edema, plan was to transition to torsemide for diuretic therapy with 1 to 1.5 L fluid restriction.  He was continued on ACEI and beta-blocker with no plans for Entresto or spironolactone given creatinine.  Plan was for repeat echocardiogram this month to assess  for improvement in LV function.  Today Andrew [name] presents with his daughter who states that they have been following very closely with nephrology at the [place].  Typically you are traveling back and forth to [facility] about once per week for either nephrology or PCP appointments.  Torsemide initially began at last OV 10/2019 which has since been titrated to 60 mg every morning and 10 mg nightly.  Continues to have lower extremity edema however no shortness of breath with normal lung exam today.  Leg edema has improved with Unna boots at home.  Typically gets home health assistance approximately once per month.  Daughter is primary caregiver and is asking if we can assist with home health CNA assistance.  They have inquired about this with her PCP several months ago and have not heard from anyone.  Daughter reports his creatinine has worsened, almost at CKD stage V at this point.  Weight has been fluctuating from day today in the setting of heart failure and kidney disease.  Seems comfortable today.  Ambulating well with his walker.  Plan is for echocardiogram to reassess LV function.  We will have them sign medical release forms [facility]  Past Medical History:  Diagnosis Date  . A-fib ([place])   . Abdominal aortic aneurysm (AAA) without rupture ([place]) 05/15/2018   Incidental finding on CT in 2012 Stable on ultrasound 2020  . Anemia 07/17/2018   He has a remote history of colon cancer with a colectomy at the [place].  No records  available He had colonoscopy in 2015 with a 7-year follow-up recommended   . Atrial fibrillation ([place]) 04/02/2014  . Cancer ([place])   . Chronic kidney disease, stage 3, mod decreased GFR (HCC) 04/03/2014  . Diabetes mellitus without complication ([place])   . Dysphagia 02/10/2019  . Dysrhythmia   . Enlarged prostate 05/15/2018  . Gait abnormality 06/20/2018  . GI bleed 04/02/2014  . History of colon cancer 04/02/2014   Colonoscopy Dr. Deatra Ina July 2015 = 3 mm sessile polyp  in the sigmoid colon.  Pathology showed tubular adenoma without high-grade dysplasia.  Surveillance colonoscopy 7 to 10 years.  . History of diabetes mellitus 02/03/2018  . Hypertension   . Left hip pain 04/14/2019  . Neuropathy 02/18/2018  . Sebaceous cyst 05/15/2018  . Seizure ([place]) 04/02/2014  . Seizures ([place])   . Systolic heart failure ([place]) 05/14/2019   Diagnosed August 2020 No ischemic work-up    Past Surgical History:  Procedure Laterality Date  . APPENDECTOMY    . [place]  . COLON SURGERY    . COLONOSCOPY N/A 04/05/2014   Procedure: COLONOSCOPY;  Surgeon: Inda Castle, MD;  Location: [facility];  Service: Endoscopy;  Laterality: N/A;     Current Outpatient Medications  Medication Sig Dispense Refill  . acetaminophen (TYLENOL) 325 MG tablet Take 2 tablets (650 mg total) by mouth every 6 (six) hours as needed for mild pain (or Fever >/= 101). 30 tablet 0  . atorvastatin (LIPITOR) 40 MG tablet Take 20 mg by mouth at bedtime.    . Calcium Carbonate (CALCIUM 500 PO) Take 500 mg by mouth in the morning and at bedtime.    . Cholecalciferol (VITAMIN D-3) 1000 units CAPS Take 1,000 Units by mouth daily.    Marland Kitchen DARBEPOETIN ALFA IJ Inject as directed. Once a month    . Elastic Bandages & Supports (LUMBAR BACK BRACE/SUPPORT PAD) MISC 1 each by Does not apply route as needed. 1 each 0  . fluticasone (FLONASE) 50 MCG/ACT nasal spray Place 1 spray into both nostrils daily. 16 g 2  . hydrocortisone cream 1 % Apply 1 application topically every 4 (four) hours.    . metoprolol tartrate (LOPRESSOR) 50 MG tablet Take 25 mg by mouth daily.    . Multiple Vitamins-Minerals (ONE-A-DAY MENS 50+ ADVANTAGE) TABS Take 1 tablet by mouth at bedtime.    . torsemide (DEMADEX) 20 MG tablet Take 60 mg by mouth daily.    . vitamin B-12 (CYANOCOBALAMIN) 1000 MCG tablet Take 1,000 mcg by mouth daily.    Marland Kitchen warfarin (COUMADIN) 6 MG tablet TAKE 1 TAB ONCE-DAILY AT 6PM ON TUESDAYS AND FRIDAYS.  TAKE 1/2 PILL ALL OTHER DAYS. 60 tablet 1  . zonisamide (ZONEGRAN) 100 MG capsule Take 300 mg by mouth 2 (two) times daily.      No current facility-administered medications for this visit.    Allergies:   Tegretol [carbamazepine], Depakote [divalproex sodium], Dilantin [phenytoin sodium extended], and Phenobarbital    Social History:  The patient  reports that he quit smoking about 50 years ago. He has never used smokeless tobacco. He reports that he does not drink alcohol or use drugs.   Family History:  The patient'sfamily history includes Diabetes Mellitus II in his brother and mother; Prostate cancer in his father.    ROS:  Please see the history of present illness.Otherwise, review of systems are positive for none.   All other systems are reviewed and negative.    PHYSICAL EXAM:  VS:  BP (!) 110/58   Pulse 67   Ht 5\' 4"  (1.626 m)   Wt 160 lb 12.8 oz (72.9 kg)   SpO2 94%   BMI 27.60 kg/m  , BMI Body mass index is 27.6 kg/m.   General: Well developed, well nourished, NAD Neck: Negative for carotid bruits. No JVD Lungs:Clear to ausculation bilaterally. Cardiovascular: Irregularly irregular with S1 S2. No murmurs Extremities: 2-3+ BLE. Radial pulses 2+ bilaterally Neuro: Alert and oriented. No focal deficits. No facial asymmetry. MAE spontaneously. Psych: Responds to questions appropriately with normal affect.     EKG:  EKG is not ordered today.   Recent Labs: 05/14/2019: TSH 5.860 09/21/2019: Hemoglobin 8.0; Platelets 198 10/27/2019: ALT 7 11/09/2019: BUN 38; Creatinine, Ser 2.68; Potassium 5.3; Sodium 142    Lipid Panel No results found for: CHOL, TRIG, HDL, CHOLHDL, VLDL, LDLCALC, LDLDIRECT    Wt Readings from Last 3 Encounters:  01/26/20 160 lb 12.8 oz (72.9 kg)  10/29/19 169 lb (76.7 kg)  10/27/19 170 lb 11.2 oz (77.4 kg)     Other studies Reviewed: Additional studies/ records that were reviewed today include:  Review of the above records demonstrates:    Echocardiogram  05/21/19:  1. The left ventricle has moderately reduced systolic function, with an  ejection fraction of 35-40%. The cavity size was normal. moderate basal  septal hypertrophy. Left ventricular diastolic function could not be  evaluated secondary to atrial  fibrillation. Left ventricular diffuse hypokinesis.  2. The right ventricle has normal systolic function. The cavity was  normal. There is no increase in right ventricular wall thickness. Right  ventricular systolic pressure is moderately elevated.  3. Left atrial size was severely dilated.  4. Right atrial size was mildly dilated.  5. Mitral valve regurgitation is mild to moderate by color flow Doppler.  6. The aortic valve is tricuspid. Aortic valve regurgitation is mild by  color flow Doppler.  7. The aorta is normal unless otherwise noted.  8. The inferior vena cava was dilated in size with <50% respiratory  variability.    ASSESSMENT AND PLAN:  1.  Chronic systolic heart failure: -Continues to have significant LE edema for which he is followed by nephrology closely. Torsemide was increased to 60 mg every morning and 10 mg nightly at last nephrology visit.  They follow his creatinine very closely with weekly BMET's.  -Repeat echocardiogram today  -Baseline weight 158lb>>160lb today  -No shortness of breath, normal lung exam  2.  Permanent atrial fibrillation: -Rate control, 67 bpm -Continue current regimen -Anticoagulation with Coumadin  3.  Recent weight loss: -Reports improved appetite -Scale supplied at last OV  4.  End of life discussion per Dr. Marlou Porch: -Discussed DNR/DNI as his health was noted to have declined -Palliative care was discussed as well  5. Chronic anticoagulation: -As above, on Coumadin for permanent atrial fibrillation  6. CKD stage IV: -Currently followed closely by nephrology at the [facility] last  -Reports urinating well, no pain, no fevers  Current medicines  are reviewed at length with the patient today.  The patient does not have concerns regarding medicines.  The following changes have been made:  no change  Labs/ tests ordered today include: None   Orders Placed This Encounter  Procedures  . ECHOCARDIOGRAM COMPLETE    Disposition:   FU with Dr. Marlou Porch  in 3 months  Signed, Kathyrn Drown, NP  01/26/2020 2:20 PM    [address],  [place], [place]  [address] Phone: [phone]; Fax: [phone]

## 2020-01-26 ENCOUNTER — Encounter: Payer: Self-pay | Admitting: Cardiology

## 2020-01-26 ENCOUNTER — Ambulatory Visit (INDEPENDENT_AMBULATORY_CARE_PROVIDER_SITE_OTHER): Payer: Medicare Other | Admitting: Cardiology

## 2020-01-26 ENCOUNTER — Other Ambulatory Visit: Payer: Self-pay

## 2020-01-26 ENCOUNTER — Telehealth: Payer: Self-pay | Admitting: Licensed Clinical Social Worker

## 2020-01-26 ENCOUNTER — Telehealth: Payer: Self-pay | Admitting: Cardiology

## 2020-01-26 VITALS — BP 110/58 | HR 67 | Ht 64.0 in | Wt 160.8 lb

## 2020-01-26 DIAGNOSIS — I714 Abdominal aortic aneurysm, without rupture, unspecified: Secondary | ICD-10-CM

## 2020-01-26 DIAGNOSIS — I428 Other cardiomyopathies: Secondary | ICD-10-CM | POA: Diagnosis not present

## 2020-01-26 DIAGNOSIS — I4821 Permanent atrial fibrillation: Secondary | ICD-10-CM

## 2020-01-26 DIAGNOSIS — N184 Chronic kidney disease, stage 4 (severe): Secondary | ICD-10-CM

## 2020-01-26 DIAGNOSIS — I129 Hypertensive chronic kidney disease with stage 1 through stage 4 chronic kidney disease, or unspecified chronic kidney disease: Secondary | ICD-10-CM

## 2020-01-26 DIAGNOSIS — N185 Chronic kidney disease, stage 5: Secondary | ICD-10-CM | POA: Diagnosis not present

## 2020-01-26 DIAGNOSIS — I5022 Chronic systolic (congestive) heart failure: Secondary | ICD-10-CM

## 2020-01-26 NOTE — Telephone Encounter (Signed)
Medical records requested from [facility]. 01/26/20 vlm

## 2020-01-26 NOTE — Patient Instructions (Signed)
Medication Instructions:   Your physician recommends that you continue on your current medications as directed. Please refer to the Current Medication list given to you today.  *If you need a refill on your cardiac medications before your next appointment, please call your pharmacy*  Lab Work:  None ordered today  Testing/Procedures:  Your physician has requested that you have an echocardiogram. Echocardiography is a painless test that uses sound waves to create images of your heart. It provides your doctor with information about the size and shape of your heart and how well your heart's chambers and valves are working. This procedure takes approximately one hour. There are no restrictions for this procedure.  Follow-Up: At [facility], you and your health needs are our priority.  As part of our continuing mission to provide you with exceptional heart care, we have created designated Provider Care Teams.  These Care Teams include your primary Cardiologist (physician) and Advanced Practice Providers (APPs -  Physician Assistants and Nurse Practitioners) who all work together to provide you with the care you need, when you need it.  We recommend signing up for the patient portal called "MyChart".  Sign up information is provided on this After Visit Summary.  MyChart is used to connect with patients for Virtual Visits (Telemedicine).  Patients are able to view lab/test results, encounter notes, upcoming appointments, etc.  Non-urgent messages can be sent to your provider as well.   To learn more about what you can do with MyChart, go to [email or web address].    Your next appointment:   3 month(s)  The format for your next appointment:   In Person  Provider:   Candee Furbish, MD

## 2020-01-26 NOTE — Telephone Encounter (Signed)
CSW received referral to assist patient with resources for home health. CSW contacted patient's home and unable to leave message. CSW will attempt again. Raquel Sarna, [place], [place]

## 2020-01-27 ENCOUNTER — Telehealth: Payer: Self-pay | Admitting: Licensed Clinical Social Worker

## 2020-01-27 NOTE — Telephone Encounter (Signed)
CSW spoke with Andrew [name] from [place] who states they are actively in the case. She states multiple issues in the home in addition to 9 cats and an strong odor. Patient has been told multiple times to keep legs up while sitting to assist with leg edema and is frequently found with his legs down while sitting. Patient has been sleeping in the chair as well. Remote Health have been in contact with the daughter who reports daily visits and that she has been trying to get additional assistance with aide through the [place] but has been unsuccessful. Patient currently has a Therapist, sports who visits to do the dressing changes and remote health will reach out to the RN to assist with the request for aide services. They will also have their social worker get involved to assist further with resources to assist patient in the home. CSW offered assistance as needed. CSW will be available as needed for further collaboration with remote health. Raquel Sarna, [place], [place]

## 2020-01-29 ENCOUNTER — Other Ambulatory Visit: Payer: Self-pay

## 2020-01-29 ENCOUNTER — Emergency Department (HOSPITAL_COMMUNITY)
Admission: EM | Admit: 2020-01-29 | Discharge: 2020-01-29 | Disposition: A | Discharge status: Home / Self Care | Payer: No Typology Code available for payment source | Attending: Emergency Medicine | Admitting: Emergency Medicine

## 2020-01-29 ENCOUNTER — Encounter (HOSPITAL_COMMUNITY): Payer: Self-pay

## 2020-01-29 DIAGNOSIS — E1122 Type 2 diabetes mellitus with diabetic chronic kidney disease: Secondary | ICD-10-CM | POA: Diagnosis not present

## 2020-01-29 DIAGNOSIS — I872 Venous insufficiency (chronic) (peripheral): Secondary | ICD-10-CM | POA: Diagnosis not present

## 2020-01-29 DIAGNOSIS — I5022 Chronic systolic (congestive) heart failure: Secondary | ICD-10-CM | POA: Diagnosis not present

## 2020-01-29 DIAGNOSIS — Z7901 Long term (current) use of anticoagulants: Secondary | ICD-10-CM | POA: Diagnosis not present

## 2020-01-29 DIAGNOSIS — I130 Hypertensive heart and chronic kidney disease with heart failure and stage 1 through stage 4 chronic kidney disease, or unspecified chronic kidney disease: Secondary | ICD-10-CM | POA: Diagnosis not present

## 2020-01-29 DIAGNOSIS — Z79899 Other long term (current) drug therapy: Secondary | ICD-10-CM | POA: Insufficient documentation

## 2020-01-29 DIAGNOSIS — I4891 Unspecified atrial fibrillation: Secondary | ICD-10-CM | POA: Insufficient documentation

## 2020-01-29 DIAGNOSIS — N183 Chronic kidney disease, stage 3 unspecified: Secondary | ICD-10-CM | POA: Diagnosis not present

## 2020-01-29 DIAGNOSIS — L03116 Cellulitis of left lower limb: Secondary | ICD-10-CM | POA: Insufficient documentation

## 2020-01-29 DIAGNOSIS — L539 Erythematous condition, unspecified: Secondary | ICD-10-CM | POA: Diagnosis present

## 2020-01-29 LAB — CBC WITH DIFFERENTIAL/PLATELET
Abs Immature Granulocytes: 0.01 10*3/uL (ref 0.00–0.07)
Basophils Absolute: 0 10*3/uL (ref 0.0–0.1)
Basophils Relative: 1 %
Eosinophils Absolute: 0.5 10*3/uL (ref 0.0–0.5)
Eosinophils Relative: 10 %
HCT: 33.3 % — ABNORMAL LOW (ref 39.0–52.0)
Hemoglobin: 10.3 g/dL — ABNORMAL LOW (ref 13.0–17.0)
Immature Granulocytes: 0 %
Lymphocytes Relative: 32 %
Lymphs Abs: 1.7 10*3/uL (ref 0.7–4.0)
MCH: 30.6 pg (ref 26.0–34.0)
MCHC: 30.9 g/dL (ref 30.0–36.0)
MCV: 98.8 fL (ref 80.0–100.0)
Monocytes Absolute: 0.4 10*3/uL (ref 0.1–1.0)
Monocytes Relative: 7 %
Neutro Abs: 2.6 10*3/uL (ref 1.7–7.7)
Neutrophils Relative %: 50 %
Platelets: 142 10*3/uL — ABNORMAL LOW (ref 150–400)
RBC: 3.37 MIL/uL — ABNORMAL LOW (ref 4.22–5.81)
RDW: 14.2 % (ref 11.5–15.5)
WBC: 5.2 10*3/uL (ref 4.0–10.5)
nRBC: 0 % (ref 0.0–0.2)

## 2020-01-29 LAB — COMPREHENSIVE METABOLIC PANEL
ALT: 12 U/L (ref 0–44)
AST: 14 U/L — ABNORMAL LOW (ref 15–41)
Albumin: 3.8 g/dL (ref 3.5–5.0)
Alkaline Phosphatase: 51 U/L (ref 38–126)
Anion gap: 8 (ref 5–15)
BUN: 47 mg/dL — ABNORMAL HIGH (ref 8–23)
CO2: 29 mmol/L (ref 22–32)
Calcium: 9.3 mg/dL (ref 8.9–10.3)
Chloride: 106 mmol/L (ref 98–111)
Creatinine, Ser: 3.07 mg/dL — ABNORMAL HIGH (ref 0.61–1.24)
GFR calc Af Amer: 21 mL/min — ABNORMAL LOW (ref >60)
GFR calc non Af Amer: 18 mL/min — ABNORMAL LOW (ref >60)
Glucose, Bld: 181 mg/dL — ABNORMAL HIGH (ref 70–99)
Potassium: 4.7 mmol/L (ref 3.5–5.1)
Sodium: 143 mmol/L (ref 135–145)
Total Bilirubin: 0.5 mg/dL (ref 0.3–1.2)
Total Protein: 6.2 g/dL — ABNORMAL LOW (ref 6.5–8.1)

## 2020-01-29 MED ORDER — CEPHALEXIN 250 MG PO CAPS
1000.0000 mg | ORAL_CAPSULE | Freq: Once | ORAL | Status: AC
  Administered 2020-01-29: 1000 mg via ORAL
  Filled 2020-01-29: qty 4

## 2020-01-29 MED ORDER — CEPHALEXIN 500 MG PO CAPS: 1000.0000 mg | ORAL_CAPSULE | Freq: Two times a day (BID) | ORAL | 0 refills | Status: DC

## 2020-01-29 NOTE — Discharge Instructions (Signed)
1.  Leave the yellow dressing over your wound for the next 3 days.  You may change the outer dressing to keep wicking away drainage. 2.  Elevate your legs as much as possible. 3.  Call your doctor on Monday to get scheduled for venous stasis wound care. 4.  Return to the emergency department if you develop worsening redness, fever, pain or other concerning symptoms.

## 2020-01-29 NOTE — ED Provider Notes (Signed)
[facility] EMERGENCY DEPARTMENT Provider Note   CSN: [id number] Arrival date & time: 01/29/20  1318     History Chief Complaint  Patient presents with  . Wound Infection    Andrew [name] is a 86 y.o. male.  HPI Patient has had problems with venous stasis and development of blistering wounds and persistent drainage.  He was being treated with Un[place] boots, diuretics and elevation.  He had improved and this was discontinued several weeks ago.  Since that time, he has had increased swelling again in the legs and the left leg has developed a ulcerated wound with persistent drainage.  Patient is anticoagulated and it is sometimes bloody in appearance.  No significant pain.  Patient has decreased sensation.  He had ABIs done a couple of weeks ago and there is decreased circulation of the right lower extremity although no acute complaints today for the right lower extremity.  No fevers, no chills, no cough, no shortness of breath.    Past Medical History:  Diagnosis Date  . A-fib ([place])   . Abdominal aortic aneurysm (AAA) without rupture ([place]) 05/15/2018   Incidental finding on CT in 2012 Stable on ultrasound 2020  . Anemia 07/17/2018   He has a remote history of colon cancer with a colectomy at the [place].  No records available He had colonoscopy in 2015 with a 7-year follow-up recommended   . Atrial fibrillation ([place]) 04/02/2014  . Cancer ([place])   . Chronic kidney disease, stage 3, mod decreased GFR (HCC) 04/03/2014  . Diabetes mellitus without complication ([place])   . Dysphagia 02/10/2019  . Dysrhythmia   . Enlarged prostate 05/15/2018  . Gait abnormality 06/20/2018  . GI bleed 04/02/2014  . History of colon cancer 04/02/2014   Colonoscopy Dr. Deatra Ina July 2015 = 3 mm sessile polyp in the sigmoid colon.  Pathology showed tubular adenoma without high-grade dysplasia.  Surveillance colonoscopy 7 to 10 years.  . History of diabetes mellitus 02/03/2018  . Hypertension   . Left hip pain  04/14/2019  . Neuropathy 02/18/2018  . Sebaceous cyst 05/15/2018  . Seizure ([place]) 04/02/2014  . Seizures ([place])   . Systolic heart failure ([place]) 05/14/2019   Diagnosed August 2020 No ischemic work-up    Patient Active Problem List   Diagnosis Date Noted  . Bilateral lower extremity edema 10/27/2019  . Systolic heart failure ([place]) 05/14/2019  . Left hip pain 04/14/2019  . Dysphagia 02/10/2019  . Anemia 07/17/2018  . Gait abnormality 06/20/2018  . Enlarged prostate 05/15/2018  . Abdominal aortic aneurysm (AAA) without rupture ([place]) 05/15/2018  . Sebaceous cyst 05/15/2018  . Neuropathy 02/18/2018  . History of diabetes mellitus 02/03/2018  . Chronic kidney disease, stage 3/4, mod decreased GFR ([place]) 04/03/2014  . Atrial fibrillation ([place]) 04/02/2014  . History of colon cancer 04/02/2014  . Seizure ([place]) 04/02/2014    Past Surgical History:  Procedure Laterality Date  . APPENDECTOMY    . [place]  . COLON SURGERY    . COLONOSCOPY N/A 04/05/2014   Procedure: COLONOSCOPY;  Surgeon: Inda Castle, MD;  Location: [facility];  Service: Endoscopy;  Laterality: N/A;       Family History  Problem Relation Age of Onset  . Diabetes Mellitus II Mother   . Prostate cancer Father   . Diabetes Mellitus II Brother     Social History   Tobacco Use  . Smoking status: Former Smoker    Quit date: 09/24/1969  Years since quitting: 50.3  . Smokeless tobacco: Never Used  Substance Use Topics  . Alcohol use: No  . Drug use: No    Home Medications Prior to Admission medications   Medication Sig Start Date End Date Taking? Authorizing Provider  acetaminophen (TYLENOL) 325 MG tablet Take 2 tablets (650 mg total) by mouth every 6 (six) hours as needed for mild pain (or Fever >/= 101). 06/20/17   Santos-Sanchez, Merlene Morse, MD  atorvastatin (LIPITOR) 40 MG tablet Take 20 mg by mouth at bedtime.    [provider]  Calcium Carbonate (CALCIUM 500 PO) Take 500 mg by  mouth in the morning and at bedtime.    [provider]  cephALEXin (KEFLEX) 500 MG capsule Take 2 capsules (1,000 mg total) by mouth 2 (two) times daily. 01/29/20   Charlesetta Shanks, MD  Cholecalciferol (VITAMIN D-3) 1000 units CAPS Take 1,000 Units by mouth daily.    [provider]  DARBEPOETIN ALFA IJ Inject as directed. Once a month    [provider]  Elastic Bandages & Supports (LUMBAR BACK BRACE/SUPPORT PAD) MISC 1 each by Does not apply route as needed. 10/27/19   Jean Rosenthal, MD  fluticasone (FLONASE) 50 MCG/ACT nasal spray Place 1 spray into both nostrils daily. 02/10/19 02/10/20  Lorella Nimrod, MD  hydrocortisone cream 1 % Apply 1 application topically every 4 (four) hours.    [provider]  metoprolol tartrate (LOPRESSOR) 50 MG tablet Take 25 mg by mouth daily.    [provider]  Multiple Vitamins-Minerals (ONE-A-DAY MENS 50+ ADVANTAGE) TABS Take 1 tablet by mouth at bedtime.    [provider]  torsemide (DEMADEX) 20 MG tablet Take 60 mg by mouth daily.    [provider]  vitamin B-12 (CYANOCOBALAMIN) 1000 MCG tablet Take 1,000 mcg by mouth daily.    [provider]  warfarin (COUMADIN) 6 MG tablet TAKE 1 TAB ONCE-DAILY AT 6PM ON [place]. TAKE 1/2 PILL ALL OTHER DAYS. 11/09/19   Velna Ochs, MD  zonisamide (ZONEGRAN) 100 MG capsule Take 300 mg by mouth 2 (two) times daily.     [provider]    Allergies    Tegretol [carbamazepine], Depakote [divalproex sodium], Dilantin [phenytoin sodium extended], and Phenobarbital  Review of Systems   Review of Systems 10 Systems reviewed and are negative for acute change except as noted in the HPI.  Physical Exam Updated Vital Signs BP 105/69 (BP Location: Right Arm)   Pulse 63   Temp 98.7 F (37.1 C) (Oral)   Resp 16   Ht 5\' 6"  (1.676 m)   Wt 75.8 kg   SpO2 100%   BMI 26.95 kg/m   Physical Exam Constitutional:      Comments:  Alert.  Nontoxic.  No respiratory distress at rest.  Eyes:     Extraocular Movements: Extraocular movements intact.  Cardiovascular:     Rate and Rhythm: Normal rate and regular rhythm.  Pulmonary:     Effort: Pulmonary effort is normal.     Breath sounds: Normal breath sounds.  Abdominal:     General: There is no distension.     Palpations: Abdomen is soft.  Musculoskeletal:        General: Swelling present. Normal range of motion.     Comments: Bilateral lower extremity edema.  See attached images for erythema and ulcerations  Skin:    General: Skin is warm and dry.  Neurological:     General: No focal  deficit present.     Coordination: Coordination normal.  Psychiatric:        Mood and Affect: Mood normal.         ED Results / Procedures / Treatments   Labs (all labs ordered are listed, but only abnormal results are displayed) Labs Reviewed  COMPREHENSIVE METABOLIC PANEL - Abnormal; Notable for the following components:      Result Value   Glucose, Bld 181 (*)    BUN 47 (*)    Creatinine, Ser 3.07 (*)    Total Protein 6.2 (*)    AST 14 (*)    GFR calc non Af Amer 18 (*)    GFR calc Af Amer 21 (*)    All other components within normal limits  CBC WITH DIFFERENTIAL/PLATELET - Abnormal; Notable for the following components:   RBC 3.37 (*)    Hemoglobin 10.3 (*)    HCT 33.3 (*)    Platelets 142 (*)    All other components within normal limits    EKG None  Radiology No results found.  Procedures Procedures (including critical care time)  Medications Ordered in ED Medications  cephALEXin (KEFLEX) capsule 1,000 mg (has no administration in time range)    ED Course  I have reviewed the triage vital signs and the nursing notes.  Pertinent labs & imaging results that were available during my care of the patient were reviewed by me and considered in my medical decision making (see chart for details).    MDM Rules/Calculators/A&P                       Known history of chronic venous stasis.  In the past has responded to [employer] and specialty wound care.  Patient does have deeper ulceration and some diffuse surrounding erythema.  At this time will opt to initiate antibiotics.  Will start Keflex.  Will apply Xeroform over wounds and recommend frequent dressing changes at home to wick away exudates.  Patient is aware of wound care treatment with elevation and will call PCP on Monday to reestablish home care of lower extremity wounds and venous stasis. Final Clinical Impression(s) / ED Diagnoses Final diagnoses:  Cellulitis of left lower extremity  Chronic venous stasis dermatitis    Rx / DC Orders ED Discharge Orders         Ordered    cephALEXin (KEFLEX) 500 MG capsule  2 times daily     01/29/20 1825           Charlesetta Shanks, MD 01/29/20 [phone]

## 2020-01-29 NOTE — ED Notes (Signed)
Left lower leg wrapped per MD request with xeroform & kerlex.

## 2020-01-29 NOTE — ED Triage Notes (Signed)
Pt has wound to left lower leg with skin tear, no bleeding at this time. Pt denies any falls or injury to the leg. Area is red and warm to touch.

## 2020-02-01 ENCOUNTER — Ambulatory Visit: Payer: Medicare Other

## 2020-02-01 ENCOUNTER — Other Ambulatory Visit: Payer: Medicare Other | Admitting: Internal Medicine

## 2020-02-01 DIAGNOSIS — Z515 Encounter for palliative care: Secondary | ICD-10-CM

## 2020-02-01 DIAGNOSIS — I5022 Chronic systolic (congestive) heart failure: Secondary | ICD-10-CM | POA: Diagnosis not present

## 2020-02-02 ENCOUNTER — Other Ambulatory Visit: Payer: Self-pay

## 2020-02-02 NOTE — Progress Notes (Signed)
    [facility] Consult Note Telephone: [phone]  Fax: [phone]  PATIENT NAME: Andrew [name] DOB: 1934/02/03 MRN: [id number]  PRIMARY CARE PROVIDER:   Dr. Larey Dresser, [facility] physician  REFERRING PROVIDER: Dr. Candee Furbish  RESPONSIBLE PARTY:   Daughter   Roddie, [place] Daughter   [phone]     RECOMMENDATIONS and PLAN:  Palliative Care Encounter  Z51.5  1.  Advance care planning: Living will, MOST and DNAR in place.  No plans for hemodialysis in the future when additional decline occurs.  He would like to remain at home with care assisted by his daughter.  Avoid rehospitalization.  Palliative care will f/u with patient in aprox 3 weeks.  2.  Edema:  Chronic and related to advanced CHF:  Continue diuretics as prescribed. Restart compression socks and leg elevation.  Attempt to improve nutrition/protein intake.  Monitor and report abnormal weight gain.  Review most recent labs as available at [facility].  3.  Venous stasis ulcers:  Active. Complete Cephalexin. Compression and leg elevation.  Restart home health nursing wound care. Keep followup appointment with [facility] provider on 02/04/20. Prevent injury from pets. (Fresh gauze wrap dressing along with silver and ABD gauze reapplied due to complete saturation of current bandages. Protective wrap to RLE(no wound present)   I spent 45 minutes providing this consultation,  from 1230 to 1315. More than 50% of the time in this consultation was spent coordinating communication patient and daughter.   HISTORY OF PRESENT ILLNESS: Follow-up with Andrew [name].  Daughter reports patient was evaluated in the ER on 01/29/20 due to wounds and bleeding from LLE.  Reports initiation of antibiotic therapy.  [facility] nursing care had previously been discontinued. ([facility] via [facility] [place] [facility]).  No dressing change since ER visit.  He has recurrent leg swelling as well without significant shortness of breath. Wt=160#.  He  is unable to manage his own ADLs.  Awaiting caregiver assistance that has been ordered.  Palliative Care was asked to help address goals of care.   CODE STATUS: DNAR  PPS: 40% weak  HOSPICE ELIGIBILITY/DIAGNOSIS: TBD  PAST MEDICAL HISTORY CHF Chronic kidney disease  Anemia of chronic disease   Medications of significance: Torsemide 60mg  qAM and 10mg  qPM Atorvastatin 20mg  qd Cephalexin 500mg  qid x 7days    PHYSICAL EXAM:   General:Chronically ill and  frail appearing, thin.  In NAD Cardiovascular: regular rate and rhythm Pulmonary: Rales of LLL, clear otherwise Abdomen: soft, nontender, + bowel sounds Extremities: 2+ edema BLE below knees to feet Skin:Large amount of serous discharge on current bandage. Superficial wounds of anterior LLE.  Xeroform gauze in place.  Mildly erythematous. Not hot to touch.  RLE skin is intact but fragile Neurological: A&O x3.  Generalized weakness   Andrew Lex, NP-C

## 2020-02-08 ENCOUNTER — Ambulatory Visit (INDEPENDENT_AMBULATORY_CARE_PROVIDER_SITE_OTHER): Payer: No Typology Code available for payment source | Admitting: Pharmacist

## 2020-02-08 DIAGNOSIS — I4891 Unspecified atrial fibrillation: Secondary | ICD-10-CM | POA: Diagnosis not present

## 2020-02-08 DIAGNOSIS — Z7901 Long term (current) use of anticoagulants: Secondary | ICD-10-CM | POA: Diagnosis not present

## 2020-02-08 LAB — POCT INR: INR: 2.1 (ref 2.0–3.0)

## 2020-02-08 NOTE — Progress Notes (Signed)
Anticoagulation Management Andrew [name] is a 86 y.o. male who reports to the clinic for monitoring of warfarin treatment.    Indication: atrial fibrillation ; Current long term use of anticoagulant.  Duration: indefinite Supervising physician: [facility] Visit History: Patient does not report signs/symptoms of bleeding or thromboembolism  Other recent changes: No diet, medications, lifestyle changes.  Anticoagulation Episode Summary    Current INR goal:  2.0-3.0  TTR:  87.3 % (2.6 y)  Next INR check:  03/21/2020  INR from last check:  2.1 (02/08/2020)  Weekly max warfarin dose:    Target end date:    INR check location:    Preferred lab:    Send INR reminders to:  ANTICOAG IMP   Indications   Atrial fibrillation ([place]) [I48.91]       Comments:          Allergies  Allergen Reactions  . Tegretol [Carbamazepine] Other (See Comments)    Made patient's feet swell  . Depakote [Divalproex Sodium] Rash  . Dilantin [Phenytoin Sodium Extended] Rash  . Phenobarbital Rash    Current Outpatient Medications:  .  acetaminophen (TYLENOL) 325 MG tablet, Take 2 tablets (650 mg total) by mouth every 6 (six) hours as needed for mild pain (or Fever >/= 101)., Disp: 30 tablet, Rfl: 0 .  atorvastatin (LIPITOR) 40 MG tablet, Take 20 mg by mouth at bedtime., Disp: , Rfl:  .  Calcium Carbonate (CALCIUM 500 PO), Take 500 mg by mouth in the morning and at bedtime., Disp: , Rfl:  .  cephALEXin (KEFLEX) 500 MG capsule, Take 2 capsules (1,000 mg total) by mouth 2 (two) times daily., Disp: 28 capsule, Rfl: 0 .  Cholecalciferol (VITAMIN D-3) 1000 units CAPS, Take 1,000 Units by mouth daily., Disp: , Rfl:  .  DARBEPOETIN ALFA IJ, Inject as directed. Once a month, Disp: , Rfl:  .  Elastic Bandages & Supports (LUMBAR BACK BRACE/SUPPORT PAD) MISC, 1 each by Does not apply route as needed., Disp: 1 each, Rfl: 0 .  fluticasone (FLONASE) 50 MCG/ACT nasal spray, Place 1 spray into  both nostrils daily., Disp: 16 g, Rfl: 2 .  hydrocortisone cream 1 %, Apply 1 application topically every 4 (four) hours., Disp: , Rfl:  .  metoprolol tartrate (LOPRESSOR) 50 MG tablet, Take 25 mg by mouth daily., Disp: , Rfl:  .  Multiple Vitamins-Minerals (ONE-A-DAY MENS 50+ ADVANTAGE) TABS, Take 1 tablet by mouth at bedtime., Disp: , Rfl:  .  torsemide (DEMADEX) 20 MG tablet, Take 60 mg by mouth daily., Disp: , Rfl:  .  vitamin B-12 (CYANOCOBALAMIN) 1000 MCG tablet, Take 1,000 mcg by mouth daily., Disp: , Rfl:  .  warfarin (COUMADIN) 6 MG tablet, TAKE 1 TAB ONCE-DAILY AT 6PM ON TUESDAYS AND FRIDAYS. TAKE 1/2 PILL ALL OTHER DAYS., Disp: 60 tablet, Rfl: 1 .  zonisamide (ZONEGRAN) 100 MG capsule, Take 300 mg by mouth 2 (two) times daily. , Disp: , Rfl:  Past Medical History:  Diagnosis Date  . A-fib ([place])   . Abdominal aortic aneurysm (AAA) without rupture ([place]) 05/15/2018   Incidental finding on CT in 2012 Stable on ultrasound 2020  . Anemia 07/17/2018   He has a remote history of colon cancer with a colectomy at the [place].  No records available He had colonoscopy in 2015 with a 7-year follow-up recommended   . Atrial fibrillation ([place]) 04/02/2014  . Cancer ([place])   . Chronic kidney disease, stage 3, mod decreased GFR (  [place]) 04/03/2014  . Diabetes mellitus without complication ([place])   . Dysphagia 02/10/2019  . Dysrhythmia   . Enlarged prostate 05/15/2018  . Gait abnormality 06/20/2018  . GI bleed 04/02/2014  . History of colon cancer 04/02/2014   Colonoscopy Dr. Deatra Ina July 2015 = 3 mm sessile polyp in the sigmoid colon.  Pathology showed tubular adenoma without high-grade dysplasia.  Surveillance colonoscopy 7 to 10 years.  . History of diabetes mellitus 02/03/2018  . Hypertension   . Left hip pain 04/14/2019  . Neuropathy 02/18/2018  . Sebaceous cyst 05/15/2018  . Seizure ([place]) 04/02/2014  . Seizures ([place])   . Systolic heart failure ([place]) 05/14/2019   Diagnosed August 2020 No ischemic work-up    Social History   Socioeconomic History  . Marital status: Married    Spouse name: Not on file  . Number of children: Not on file  . Years of education: Not on file  . Highest education level: Not on file  Occupational History  . Not on file  Tobacco Use  . Smoking status: Former Smoker    Quit date: 09/24/1969    Years since quitting: 50.4  . Smokeless tobacco: Never Used  Substance and Sexual Activity  . Alcohol use: No  . Drug use: No  . Sexual activity: Not on file  Other Topics Concern  . Not on file  Social History Narrative  . Not on file   Social Determinants of Health   Financial Resource Strain:   . Difficulty of Paying Living Expenses:   Food Insecurity:   . Worried About Charity fundraiser in the Last Year:   . Arboriculturist in the Last Year:   Transportation Needs:   . Film/video editor (Medical):   Marland Kitchen Lack of Transportation (Non-Medical):   Physical Activity:   . Days of Exercise per Week:   . Minutes of Exercise per Session:   Stress:   . Feeling of Stress :   Social Connections:   . Frequency of Communication with Friends and Family:   . Frequency of Social Gatherings with Friends and Family:   . Attends Religious Services:   . Active Member of Clubs or Organizations:   . Attends Archivist Meetings:   Marland Kitchen Marital Status:    Family History  Problem Relation Age of Onset  . Diabetes Mellitus II Mother   . Prostate cancer Father   . Diabetes Mellitus II Brother     ASSESSMENT Recent Results: The most recent result is correlated with 27 mg per week: Lab Results  Component Value Date   INR 2.1 02/08/2020   INR 2.4 12/21/2019   INR 2.8 11/09/2019    Anticoagulation Dosing: Description   Take 1 tablet of your teal 6mg  warfarin tablets by mouth once daily on Tuesdays, Thursdays and Saturdays of each week, on all other days, take 1/2 tablet by mouth once daily.     INR today: Therapeutic  PLAN Weekly dose was increased by  11% to 30 mg per week  Patient Instructions  Patient instructed to take medications as defined in the Anti-coagulation Track section of this encounter.  Patient instructed to take today's dose.  Patient instructed to take 1 tablet of your teal 6mg  warfarin tablets by mouth once daily on Tuesdays, Thursdays and Saturdays of each week, on all other days, take 1/2 tablet by mouth once daily. Patient verbalized understanding of these instructions.    Patient advised to contact clinic or seek  medical attention if signs/symptoms of bleeding or thromboembolism occur.  Patient verbalized understanding by repeating back information and was advised to contact me if further medication-related questions arise. Patient was also provided an information handout.  Follow-up Return in 6 weeks (on 03/21/2020) for Follow up INR.  Pennie Banter, PharmD, CPP  15 minutes spent face-to-face with the patient during the encounter. 50% of time spent on education, including signs/sx bleeding and clotting, as well as food and drug interactions with warfarin. 50% of time was spent on fingerprick POC INR sample collection,processing, results determination, and documentation in http://www.kim.net/.

## 2020-02-08 NOTE — Patient Instructions (Signed)
Patient instructed to take medications as defined in the Anti-coagulation Track section of this encounter.  Patient instructed to take today's dose.  Patient instructed to take 1 tablet of your teal 6mg  warfarin tablets by mouth once daily on Tuesdays, Thursdays and Saturdays of each week, on all other days, take 1/2 tablet by mouth once daily. Patient verbalized understanding of these instructions.

## 2020-02-09 ENCOUNTER — Telehealth: Payer: Self-pay | Admitting: Cardiology

## 2020-02-09 NOTE — Telephone Encounter (Signed)
Medical records received from [facility]. Placed in box [place]'s box for review. 02/09/20

## 2020-02-16 ENCOUNTER — Other Ambulatory Visit: Payer: Self-pay

## 2020-02-16 ENCOUNTER — Ambulatory Visit (HOSPITAL_COMMUNITY): Payer: No Typology Code available for payment source | Attending: Cardiology

## 2020-02-16 DIAGNOSIS — I428 Other cardiomyopathies: Secondary | ICD-10-CM | POA: Diagnosis present

## 2020-03-03 ENCOUNTER — Other Ambulatory Visit: Payer: Self-pay

## 2020-03-03 ENCOUNTER — Other Ambulatory Visit: Payer: Medicare Other | Admitting: Internal Medicine

## 2020-03-03 DIAGNOSIS — Z515 Encounter for palliative care: Secondary | ICD-10-CM | POA: Diagnosis not present

## 2020-03-03 DIAGNOSIS — I5022 Chronic systolic (congestive) heart failure: Secondary | ICD-10-CM

## 2020-03-03 NOTE — Progress Notes (Signed)
     [facility] Consult Note Telephone: [phone]  Fax: [phone]  PATIENT NAME: Andrew [name] DOB: 04/05/1934 MRN: [id number]  PRIMARY CARE PROVIDER:   Dr. Larey Dresser, [facility] physician  REFERRING PROVIDER: Dr. Candee Furbish  RESPONSIBLE PARTY:   Daughter   Andrew, [name] Daughter   [phone]     RECOMMENDATIONS and PLAN:  Palliative Care Encounter  Z51.5  1.  Advance care planning: Living will, MOST and DNAR in place.  No plans for hemodialysis in the future when additional decline occurs.  He would still like to remain at home with care assisted by his daughter.  Palliative care will f/u with patient in aprox 4-6 weeks.  2.  Edema:  Chronic and related to advanced CHF and CKD:  Continue diuretics as prescribed, compression socks or wraps(applied during visit today). Monitor and report abnormal weight gain.   3. Debilitation:  Related to multiple comorbidities.  Fall prevention readdressed.  Attempt to improve nutritional intake and hydration.  Provide supportive care as needed,  4.  Venous stasis ulcers:  Resolved.  Continue management of peripheral edema.  I spent 35 minutes providing this consultation,  from 1030 to 1105. More than 50% of the time in this consultation was spent coordinating communication patient and daughter.   HISTORY OF PRESENT ILLNESS: Follow-up with Andrew [name].  Daughter reports continued intermittent swelling of legs. No recent hospitalizations or wounds.  He is unable to manage his own ADLs.   Palliative Care was asked to help address goals of care.   CODE STATUS: DNAR  PPS: 40% weak  HOSPICE ELIGIBILITY/DIAGNOSIS: TBD  PAST MEDICAL HISTORY CHF Chronic kidney disease  Anemia of chronic disease   Medications of significance: Torsemide 60mg  qAM and 10mg  qPM Atorvastatin 20mg  qd Cephalexin 500mg  qid x 7days    PHYSICAL EXAM:   General:Chronically ill and  frail appearing, thin.  In  NAD Cardiovascular: regular rate and rhythm Pulmonary: Rales of LLL, clear otherwise Abdomen: soft, nontender, + bowel sounds Extremities: 2+ edema BLE below knees to feet Skin:Pale in color.  Hyperpigmentation of distal BLE Neurological: A&O x3.  Generalized weakness  Psych:  Pleasant mood.  Cooperative  Andrew Lex, NP-C

## 2020-03-07 IMAGING — DX DG HIP (WITH OR WITHOUT PELVIS) 2-3V LEFT
3 series · 3 of 3 positions shown · non-contrast
Comparison: None.

CLINICAL DATA: Posterior LEFT hip pain for 6 months.

EXAM:
DG HIP (WITH OR WITHOUT PELVIS) 2-3V LEFT

[pelvis ap]
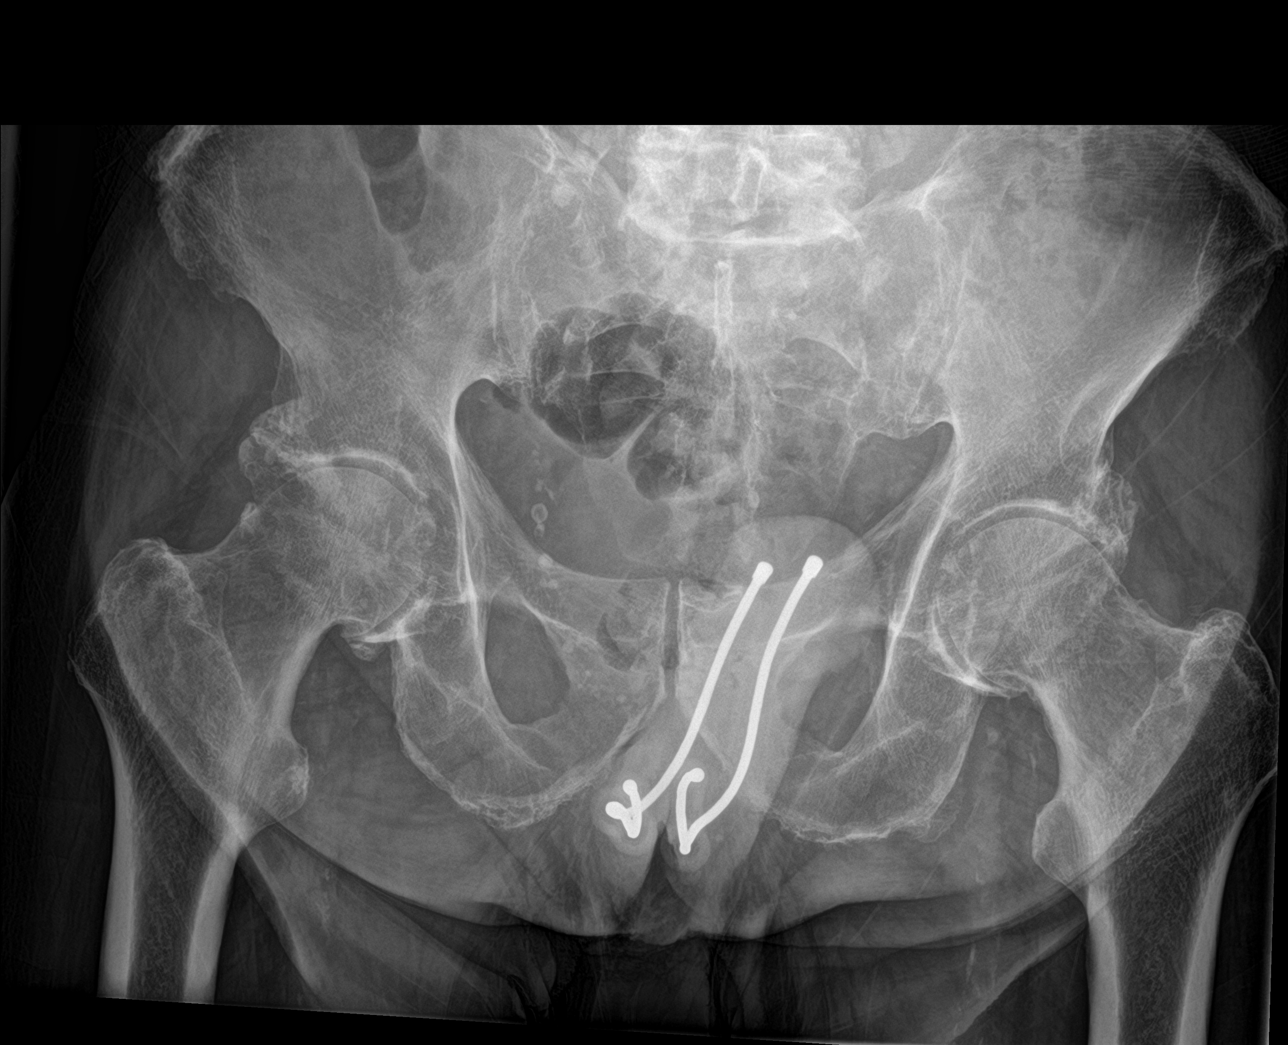

[hip ap]
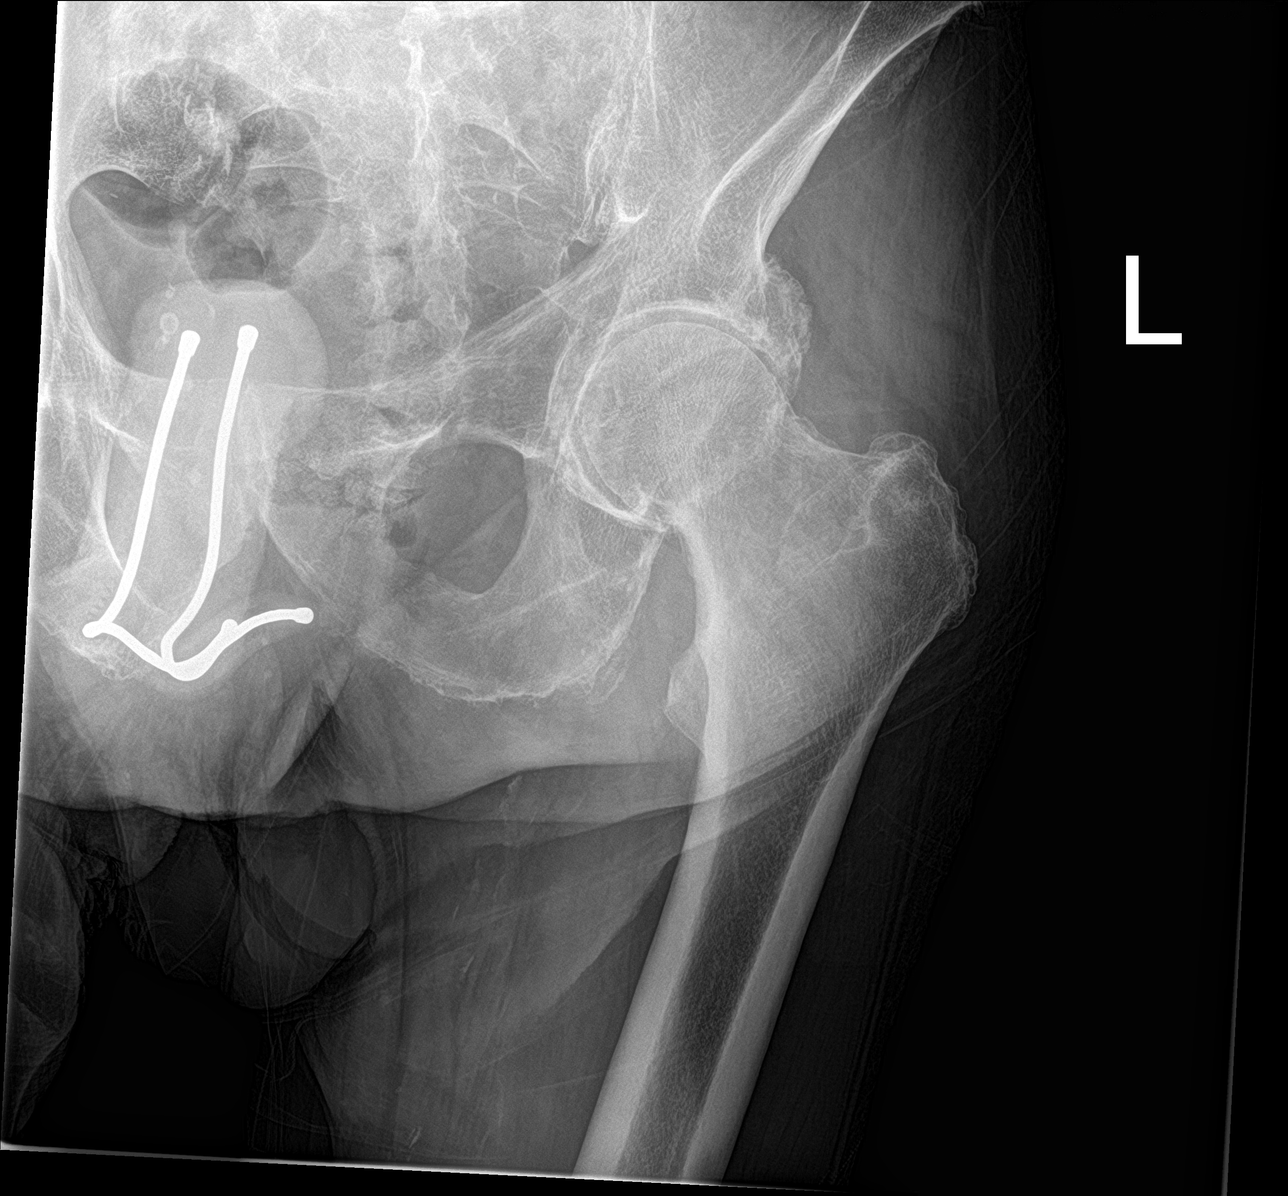

[hip lat]
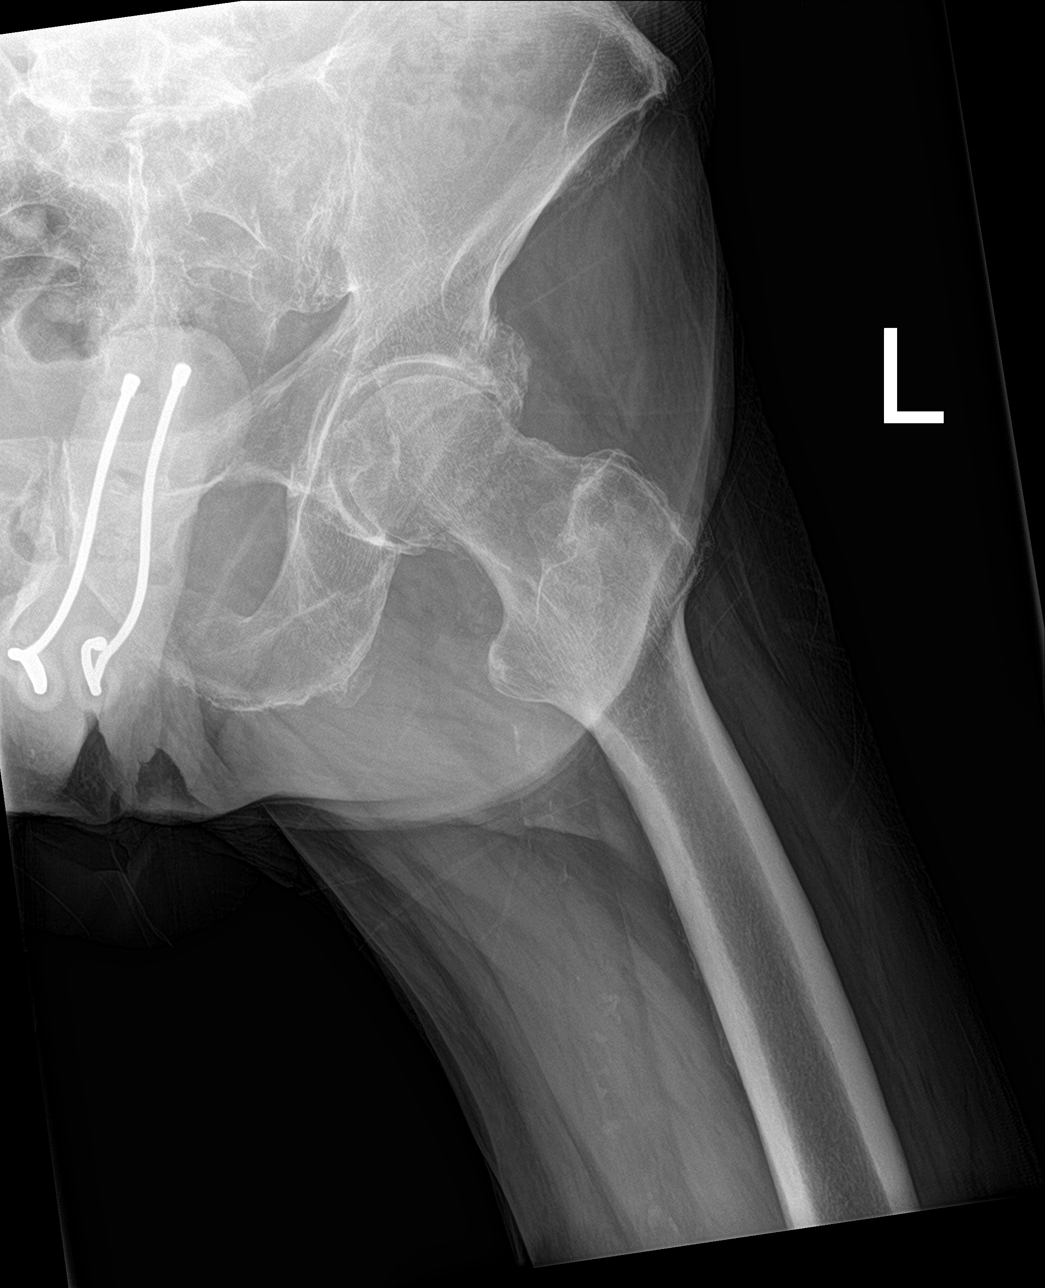

[3 of 3 positions shown; findings below may reference images not displayed]

FINDINGS: Osseous alignment is normal. No fracture line or displaced fracture
fragment seen. Degenerative changes at both hips, mild to moderate
in degree with associated joint space narrowing and osseous
spurring.

Penile implants in place. Vascular calcifications within the LEFT
thigh. Soft tissues about the pelvis and LEFT hip are otherwise
unremarkable.
IMPRESSION: 1. Degenerative changes at both hips, mild to moderate in degree.
2. No acute findings.

## 2020-03-11 ENCOUNTER — Inpatient Hospital Stay (HOSPITAL_COMMUNITY): Payer: No Typology Code available for payment source

## 2020-03-11 ENCOUNTER — Emergency Department (HOSPITAL_COMMUNITY): Payer: No Typology Code available for payment source

## 2020-03-11 ENCOUNTER — Encounter (HOSPITAL_COMMUNITY): Payer: Self-pay

## 2020-03-11 ENCOUNTER — Other Ambulatory Visit: Payer: Self-pay

## 2020-03-11 ENCOUNTER — Inpatient Hospital Stay (HOSPITAL_COMMUNITY)
Admission: RE | Admit: 2020-03-11 | Discharge: 2020-03-21 | DRG: 640 | Disposition: A | Discharge status: Skilled Nursing Facility | Payer: No Typology Code available for payment source | Attending: Internal Medicine | Admitting: Internal Medicine

## 2020-03-11 DIAGNOSIS — M47818 Spondylosis without myelopathy or radiculopathy, sacral and sacrococcygeal region: Secondary | ICD-10-CM | POA: Diagnosis present

## 2020-03-11 DIAGNOSIS — R000 Tachycardia, unspecified: Secondary | ICD-10-CM | POA: Diagnosis not present

## 2020-03-11 DIAGNOSIS — E43 Unspecified severe protein-calorie malnutrition: Secondary | ICD-10-CM | POA: Insufficient documentation

## 2020-03-11 DIAGNOSIS — I130 Hypertensive heart and chronic kidney disease with heart failure and stage 1 through stage 4 chronic kidney disease, or unspecified chronic kidney disease: Secondary | ICD-10-CM | POA: Diagnosis present

## 2020-03-11 DIAGNOSIS — N179 Acute kidney failure, unspecified: Secondary | ICD-10-CM | POA: Diagnosis not present

## 2020-03-11 DIAGNOSIS — Z8042 Family history of malignant neoplasm of prostate: Secondary | ICD-10-CM

## 2020-03-11 DIAGNOSIS — R64 Cachexia: Secondary | ICD-10-CM | POA: Diagnosis present

## 2020-03-11 DIAGNOSIS — D329 Benign neoplasm of meninges, unspecified: Secondary | ICD-10-CM | POA: Diagnosis present

## 2020-03-11 DIAGNOSIS — I509 Heart failure, unspecified: Secondary | ICD-10-CM | POA: Diagnosis not present

## 2020-03-11 DIAGNOSIS — R0600 Dyspnea, unspecified: Secondary | ICD-10-CM

## 2020-03-11 DIAGNOSIS — I4891 Unspecified atrial fibrillation: Secondary | ICD-10-CM | POA: Diagnosis present

## 2020-03-11 DIAGNOSIS — Z833 Family history of diabetes mellitus: Secondary | ICD-10-CM

## 2020-03-11 DIAGNOSIS — Z87891 Personal history of nicotine dependence: Secondary | ICD-10-CM

## 2020-03-11 DIAGNOSIS — Z7901 Long term (current) use of anticoagulants: Secondary | ICD-10-CM | POA: Diagnosis not present

## 2020-03-11 DIAGNOSIS — N183 Chronic kidney disease, stage 3 unspecified: Secondary | ICD-10-CM | POA: Diagnosis not present

## 2020-03-11 DIAGNOSIS — N184 Chronic kidney disease, stage 4 (severe): Secondary | ICD-10-CM | POA: Diagnosis present

## 2020-03-11 DIAGNOSIS — L89302 Pressure ulcer of unspecified buttock, stage 2: Secondary | ICD-10-CM | POA: Diagnosis present

## 2020-03-11 DIAGNOSIS — D649 Anemia, unspecified: Secondary | ICD-10-CM | POA: Diagnosis present

## 2020-03-11 DIAGNOSIS — J9691 Respiratory failure, unspecified with hypoxia: Secondary | ICD-10-CM | POA: Diagnosis not present

## 2020-03-11 DIAGNOSIS — M545 Low back pain: Secondary | ICD-10-CM | POA: Diagnosis not present

## 2020-03-11 DIAGNOSIS — I714 Abdominal aortic aneurysm, without rupture: Secondary | ICD-10-CM | POA: Diagnosis present

## 2020-03-11 DIAGNOSIS — E1122 Type 2 diabetes mellitus with diabetic chronic kidney disease: Secondary | ICD-10-CM | POA: Diagnosis present

## 2020-03-11 DIAGNOSIS — L89312 Pressure ulcer of right buttock, stage 2: Secondary | ICD-10-CM | POA: Diagnosis present

## 2020-03-11 DIAGNOSIS — E1140 Type 2 diabetes mellitus with diabetic neuropathy, unspecified: Secondary | ICD-10-CM | POA: Diagnosis present

## 2020-03-11 DIAGNOSIS — J189 Pneumonia, unspecified organism: Secondary | ICD-10-CM | POA: Diagnosis not present

## 2020-03-11 DIAGNOSIS — Z20822 Contact with and (suspected) exposure to covid-19: Secondary | ICD-10-CM | POA: Diagnosis present

## 2020-03-11 DIAGNOSIS — I5022 Chronic systolic (congestive) heart failure: Secondary | ICD-10-CM | POA: Diagnosis present

## 2020-03-11 DIAGNOSIS — M1909 Primary osteoarthritis, other specified site: Secondary | ICD-10-CM | POA: Diagnosis not present

## 2020-03-11 DIAGNOSIS — Y92009 Unspecified place in unspecified non-institutional (private) residence as the place of occurrence of the external cause: Secondary | ICD-10-CM | POA: Diagnosis not present

## 2020-03-11 DIAGNOSIS — G9340 Encephalopathy, unspecified: Secondary | ICD-10-CM | POA: Diagnosis present

## 2020-03-11 DIAGNOSIS — R1310 Dysphagia, unspecified: Secondary | ICD-10-CM

## 2020-03-11 DIAGNOSIS — T452X5A Adverse effect of vitamins, initial encounter: Secondary | ICD-10-CM | POA: Diagnosis present

## 2020-03-11 DIAGNOSIS — Z66 Do not resuscitate: Secondary | ICD-10-CM | POA: Diagnosis present

## 2020-03-11 DIAGNOSIS — Z79899 Other long term (current) drug therapy: Secondary | ICD-10-CM

## 2020-03-11 DIAGNOSIS — E673 Hypervitaminosis D: Secondary | ICD-10-CM | POA: Diagnosis not present

## 2020-03-11 DIAGNOSIS — Z8673 Personal history of transient ischemic attack (TIA), and cerebral infarction without residual deficits: Secondary | ICD-10-CM

## 2020-03-11 DIAGNOSIS — Z7401 Bed confinement status: Secondary | ICD-10-CM | POA: Diagnosis not present

## 2020-03-11 DIAGNOSIS — R1312 Dysphagia, oropharyngeal phase: Secondary | ICD-10-CM | POA: Diagnosis not present

## 2020-03-11 DIAGNOSIS — L89322 Pressure ulcer of left buttock, stage 2: Secondary | ICD-10-CM | POA: Diagnosis present

## 2020-03-11 DIAGNOSIS — I4821 Permanent atrial fibrillation: Secondary | ICD-10-CM | POA: Diagnosis present

## 2020-03-11 DIAGNOSIS — Z85038 Personal history of other malignant neoplasm of large intestine: Secondary | ICD-10-CM

## 2020-03-11 DIAGNOSIS — G40909 Epilepsy, unspecified, not intractable, without status epilepticus: Secondary | ICD-10-CM | POA: Diagnosis present

## 2020-03-11 DIAGNOSIS — L8990 Pressure ulcer of unspecified site, unspecified stage: Secondary | ICD-10-CM | POA: Insufficient documentation

## 2020-03-11 DIAGNOSIS — R5381 Other malaise: Secondary | ICD-10-CM | POA: Diagnosis not present

## 2020-03-11 DIAGNOSIS — I493 Ventricular premature depolarization: Secondary | ICD-10-CM | POA: Diagnosis present

## 2020-03-11 DIAGNOSIS — Z888 Allergy status to other drugs, medicaments and biological substances status: Secondary | ICD-10-CM

## 2020-03-11 DIAGNOSIS — I5020 Unspecified systolic (congestive) heart failure: Secondary | ICD-10-CM | POA: Diagnosis present

## 2020-03-11 DIAGNOSIS — G8929 Other chronic pain: Secondary | ICD-10-CM | POA: Diagnosis present

## 2020-03-11 DIAGNOSIS — M549 Dorsalgia, unspecified: Secondary | ICD-10-CM | POA: Diagnosis not present

## 2020-03-11 DIAGNOSIS — N189 Chronic kidney disease, unspecified: Secondary | ICD-10-CM

## 2020-03-11 DIAGNOSIS — M2550 Pain in unspecified joint: Secondary | ICD-10-CM | POA: Diagnosis not present

## 2020-03-11 DIAGNOSIS — M25552 Pain in left hip: Secondary | ICD-10-CM | POA: Diagnosis not present

## 2020-03-11 DIAGNOSIS — T503X5A Adverse effect of electrolytic, caloric and water-balance agents, initial encounter: Secondary | ICD-10-CM | POA: Diagnosis present

## 2020-03-11 DIAGNOSIS — D638 Anemia in other chronic diseases classified elsewhere: Secondary | ICD-10-CM | POA: Diagnosis not present

## 2020-03-11 DIAGNOSIS — N185 Chronic kidney disease, stage 5: Secondary | ICD-10-CM | POA: Diagnosis not present

## 2020-03-11 DIAGNOSIS — M25511 Pain in right shoulder: Secondary | ICD-10-CM | POA: Diagnosis present

## 2020-03-11 DIAGNOSIS — Z9049 Acquired absence of other specified parts of digestive tract: Secondary | ICD-10-CM

## 2020-03-11 DIAGNOSIS — D631 Anemia in chronic kidney disease: Secondary | ICD-10-CM | POA: Diagnosis present

## 2020-03-11 DIAGNOSIS — R0602 Shortness of breath: Secondary | ICD-10-CM | POA: Diagnosis not present

## 2020-03-11 DIAGNOSIS — R2681 Unsteadiness on feet: Secondary | ICD-10-CM | POA: Diagnosis not present

## 2020-03-11 DIAGNOSIS — R531 Weakness: Secondary | ICD-10-CM | POA: Diagnosis not present

## 2020-03-11 DIAGNOSIS — M6281 Muscle weakness (generalized): Secondary | ICD-10-CM | POA: Diagnosis not present

## 2020-03-11 DIAGNOSIS — R079 Chest pain, unspecified: Secondary | ICD-10-CM | POA: Diagnosis not present

## 2020-03-11 DIAGNOSIS — E785 Hyperlipidemia, unspecified: Secondary | ICD-10-CM | POA: Diagnosis present

## 2020-03-11 DIAGNOSIS — Z8546 Personal history of malignant neoplasm of prostate: Secondary | ICD-10-CM

## 2020-03-11 DIAGNOSIS — Z6827 Body mass index (BMI) 27.0-27.9, adult: Secondary | ICD-10-CM

## 2020-03-11 DIAGNOSIS — I499 Cardiac arrhythmia, unspecified: Secondary | ICD-10-CM | POA: Diagnosis not present

## 2020-03-11 LAB — PROTIME-INR
INR: 4.5 (ref 0.8–1.2)
Prothrombin Time: 41.1 seconds — ABNORMAL HIGH (ref 11.4–15.2)

## 2020-03-11 LAB — CBG MONITORING, ED: Glucose-Capillary: 166 mg/dL — ABNORMAL HIGH (ref 70–99)

## 2020-03-11 LAB — DIFFERENTIAL
Abs Immature Granulocytes: 0.02 10*3/uL (ref 0.00–0.07)
Basophils Absolute: 0.1 10*3/uL (ref 0.0–0.1)
Basophils Relative: 1 %
Eosinophils Absolute: 0.4 10*3/uL (ref 0.0–0.5)
Eosinophils Relative: 5 %
Immature Granulocytes: 0 %
Lymphocytes Relative: 14 %
Lymphs Abs: 1.1 10*3/uL (ref 0.7–4.0)
Monocytes Absolute: 0.9 10*3/uL (ref 0.1–1.0)
Monocytes Relative: 11 %
Neutro Abs: 5.4 10*3/uL (ref 1.7–7.7)
Neutrophils Relative %: 69 %

## 2020-03-11 LAB — I-STAT CHEM 8, ED
BUN: 46 mg/dL — ABNORMAL HIGH (ref 8–23)
Calcium, Ion: 1.76 mmol/L (ref 1.15–1.40)
Chloride: 99 mmol/L (ref 98–111)
Creatinine, Ser: 2.8 mg/dL — ABNORMAL HIGH (ref 0.61–1.24)
Glucose, Bld: 155 mg/dL — ABNORMAL HIGH (ref 70–99)
HCT: 29 % — ABNORMAL LOW (ref 39.0–52.0)
Hemoglobin: 9.9 g/dL — ABNORMAL LOW (ref 13.0–17.0)
Potassium: 4.4 mmol/L (ref 3.5–5.1)
Sodium: 143 mmol/L (ref 135–145)
TCO2: 34 mmol/L — ABNORMAL HIGH (ref 22–32)

## 2020-03-11 LAB — CBC
HCT: 31.5 % — ABNORMAL LOW (ref 39.0–52.0)
Hemoglobin: 9.6 g/dL — ABNORMAL LOW (ref 13.0–17.0)
MCH: 29.7 pg (ref 26.0–34.0)
MCHC: 30.5 g/dL (ref 30.0–36.0)
MCV: 97.5 fL (ref 80.0–100.0)
Platelets: 147 10*3/uL — ABNORMAL LOW (ref 150–400)
RBC: 3.23 MIL/uL — ABNORMAL LOW (ref 4.22–5.81)
RDW: 13.7 % (ref 11.5–15.5)
WBC: 7.9 10*3/uL (ref 4.0–10.5)
nRBC: 0 % (ref 0.0–0.2)

## 2020-03-11 LAB — ABO/RH: ABO/RH(D): A POS

## 2020-03-11 LAB — TYPE AND SCREEN
ABO/RH(D): A POS
Antibody Screen: NEGATIVE

## 2020-03-11 LAB — COMPREHENSIVE METABOLIC PANEL
ALT: 14 U/L (ref 0–44)
AST: 21 U/L (ref 15–41)
Albumin: 3.4 g/dL — ABNORMAL LOW (ref 3.5–5.0)
Alkaline Phosphatase: 52 U/L (ref 38–126)
Anion gap: 8 (ref 5–15)
BUN: 48 mg/dL — ABNORMAL HIGH (ref 8–23)
CO2: 33 mmol/L — ABNORMAL HIGH (ref 22–32)
Calcium: 12.3 mg/dL — ABNORMAL HIGH (ref 8.9–10.3)
Chloride: 102 mmol/L (ref 98–111)
Creatinine, Ser: 2.76 mg/dL — ABNORMAL HIGH (ref 0.61–1.24)
GFR calc Af Amer: 23 mL/min — ABNORMAL LOW (ref >60)
GFR calc non Af Amer: 20 mL/min — ABNORMAL LOW (ref >60)
Glucose, Bld: 156 mg/dL — ABNORMAL HIGH (ref 70–99)
Potassium: 4.4 mmol/L (ref 3.5–5.1)
Sodium: 143 mmol/L (ref 135–145)
Total Bilirubin: 0.7 mg/dL (ref 0.3–1.2)
Total Protein: 6.1 g/dL — ABNORMAL LOW (ref 6.5–8.1)

## 2020-03-11 LAB — LACTIC ACID, PLASMA: Lactic Acid, Venous: 1.5 mmol/L (ref 0.5–1.9)

## 2020-03-11 LAB — SARS CORONAVIRUS 2 BY RT PCR (HOSPITAL ORDER, PERFORMED IN ~~LOC~~ HOSPITAL LAB): SARS Coronavirus 2: NEGATIVE

## 2020-03-11 LAB — APTT: aPTT: 81 seconds — ABNORMAL HIGH (ref 24–36)

## 2020-03-11 MED ORDER — ZONISAMIDE 100 MG PO CAPS
300.0000 mg | ORAL_CAPSULE | Freq: Two times a day (BID) | ORAL | Status: DC
  Administered 2020-03-12 – 2020-03-21 (×19): 300 mg via ORAL
  Filled 2020-03-11 (×20): qty 3

## 2020-03-11 MED ORDER — ATORVASTATIN CALCIUM 10 MG PO TABS
20.0000 mg | ORAL_TABLET | Freq: Every day | ORAL | Status: DC
  Administered 2020-03-11 – 2020-03-20 (×10): 20 mg via ORAL
  Filled 2020-03-11 (×10): qty 2

## 2020-03-11 MED ORDER — ZONISAMIDE 100 MG PO CAPS
300.0000 mg | ORAL_CAPSULE | Freq: Once | ORAL | Status: AC
  Administered 2020-03-11: 300 mg via ORAL
  Filled 2020-03-11: qty 3

## 2020-03-11 MED ORDER — METOPROLOL TARTRATE 25 MG PO TABS
25.0000 mg | ORAL_TABLET | Freq: Every day | ORAL | Status: DC
  Administered 2020-03-11 – 2020-03-12 (×2): 25 mg via ORAL
  Filled 2020-03-11 (×3): qty 1

## 2020-03-11 MED ORDER — WARFARIN - PHARMACIST DOSING INPATIENT: Freq: Every day | Status: DC

## 2020-03-11 MED ORDER — LACTATED RINGERS IV SOLN: INTRAVENOUS | Status: AC

## 2020-03-11 MED ORDER — ACETAMINOPHEN 325 MG PO TABS
650.0000 mg | ORAL_TABLET | Freq: Four times a day (QID) | ORAL | Status: DC | PRN
  Administered 2020-03-13 – 2020-03-21 (×16): 650 mg via ORAL
  Filled 2020-03-11 (×16): qty 2

## 2020-03-11 MED ORDER — SODIUM CHLORIDE 0.9 % IV SOLN: INTRAVENOUS | Status: DC

## 2020-03-11 NOTE — ED Provider Notes (Signed)
[facility] EMERGENCY DEPARTMENT Provider Note   CSN: [id number] Arrival date & time: 03/11/20  1601     History Chief Complaint  Patient presents with  . Weakness    Andrew [name] is a 86 y.o. male.  HPI 86 year old male presents with weakness.  History is mostly by the daughter.  3 days of right arm weakness that has progressed into generalized weakness.  He has also had slurred speech over the last 1 day or so.  No headaches or confusion.  No fevers or focal pain.  He has chronic lower extremity edema that is a little better than typical.  No shortness of breath.  He has been waxing and waning between kidney dysfunction and CHF with on and off balance of his fluids.   Past Medical History:  Diagnosis Date  . A-fib ([place])   . Abdominal aortic aneurysm (AAA) without rupture ([place]) 05/15/2018   Incidental finding on CT in 2012 Stable on ultrasound 2020  . Anemia 07/17/2018   He has a remote history of colon cancer with a colectomy at the [place].  No records available He had colonoscopy in 2015 with a 7-year follow-up recommended   . Atrial fibrillation ([place]) 04/02/2014  . Cancer ([place])   . Chronic kidney disease, stage 3, mod decreased GFR (HCC) 04/03/2014  . Diabetes mellitus without complication ([place])   . Dysphagia 02/10/2019  . Dysrhythmia   . Enlarged prostate 05/15/2018  . Gait abnormality 06/20/2018  . GI bleed 04/02/2014  . History of colon cancer 04/02/2014   Colonoscopy Dr. Deatra Ina July 2015 = 3 mm sessile polyp in the sigmoid colon.  Pathology showed tubular adenoma without high-grade dysplasia.  Surveillance colonoscopy 7 to 10 years.  . History of diabetes mellitus 02/03/2018  . Hypertension   . Left hip pain 04/14/2019  . Neuropathy 02/18/2018  . Sebaceous cyst 05/15/2018  . Seizure ([place]) 04/02/2014  . Seizures ([place])   . Systolic heart failure ([place]) 05/14/2019   Diagnosed August 2020 No ischemic work-up    Patient Active Problem List   Diagnosis Date Noted    . Hypercalcemia 03/11/2020  . Bilateral lower extremity edema 10/27/2019  . Systolic heart failure ([place]) 05/14/2019  . Left hip pain 04/14/2019  . Dysphagia 02/10/2019  . Anemia 07/17/2018  . Gait abnormality 06/20/2018  . Enlarged prostate 05/15/2018  . Abdominal aortic aneurysm (AAA) without rupture ([place]) 05/15/2018  . Sebaceous cyst 05/15/2018  . Neuropathy 02/18/2018  . History of diabetes mellitus 02/03/2018  . 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 Atrial fibrillation ([place]) 04/02/2014  . History of colon cancer 04/02/2014  . Seizure ([place]) 04/02/2014    Past Surgical History:  Procedure Laterality Date  . APPENDECTOMY    . [place]  . COLON SURGERY    . COLONOSCOPY N/A 04/05/2014   Procedure: COLONOSCOPY;  Surgeon: Inda Castle, MD;  Location: [facility];  Service: Endoscopy;  Laterality: N/A;       Family History  Problem Relation Age of Onset  . Diabetes Mellitus II Mother   . Prostate cancer Father   . Diabetes Mellitus II Brother     Social History   Tobacco Use  . Smoking status: Former Smoker    Quit date: 09/24/1969    Years since quitting: 50.4  . Smokeless tobacco: Never Used  Substance Use Topics  . Alcohol use: No  . Drug use: No    Home Medications Prior  to Admission medications   Medication Sig Start Date End Date Taking? Authorizing Provider  acetaminophen (TYLENOL) 325 MG tablet Take 2 tablets (650 mg total) by mouth every 6 (six) hours as needed for mild pain (or Fever >/= 101). 06/20/17  Yes Santos-Sanchez, Merlene Morse, MD  atorvastatin (LIPITOR) 40 MG tablet Take 20 mg by mouth at bedtime.   Yes [provider]  Calcium Carbonate (CALCIUM 500 PO) Take 500 mg by mouth in the morning and at bedtime.   Yes [provider]  Cholecalciferol (VITAMIN D-3) 1000 units CAPS Take 1,000 Units by mouth daily.   Yes [provider]  DARBEPOETIN ALFA IJ Inject 1 each as  directed every 30 (thirty) days. Once a month    Yes [provider]  fluticasone (FLONASE) 50 MCG/ACT nasal spray Place 1 spray into both nostrils daily. 02/10/19 03/11/20 Yes Lorella Nimrod, MD  hydrocortisone cream 1 % Apply 1 application topically every 4 (four) hours.   Yes [provider]  latanoprost (XALATAN) 0.005 % ophthalmic solution Place 1 drop into both eyes at bedtime.   Yes [provider]  metoprolol tartrate (LOPRESSOR) 50 MG tablet Take 25 mg by mouth daily.   Yes [provider]  Multiple Vitamins-Minerals (ONE-A-DAY MENS 50+ ADVANTAGE) TABS Take 1 tablet by mouth daily.    Yes [provider]  torsemide (DEMADEX) 20 MG tablet Take 40 mg by mouth daily.    Yes [provider]  vitamin B-12 (CYANOCOBALAMIN) 1000 MCG tablet Take 1,000 mcg by mouth daily.   Yes [provider]  warfarin (COUMADIN) 6 MG tablet TAKE 1 TAB ONCE-DAILY AT [place]. TAKE 1/2 PILL ALL OTHER DAYS. Patient taking differently: Take 6 mg by mouth See admin instructions. Takes 6 mg tablet on  Tuesday, Thursday and Saturday. Then 3 mg tablet on all other days. 11/09/19  Yes Velna Ochs, MD  zonisamide (ZONEGRAN) 100 MG capsule Take 300 mg by mouth 2 (two) times daily.    Yes [provider]  cephALEXin (KEFLEX) 500 MG capsule Take 2 capsules (1,000 mg total) by mouth 2 (two) times daily. Patient not taking: Reported on 03/11/2020 01/29/20   Charlesetta Shanks, MD  Elastic Bandages & Supports (LUMBAR BACK BRACE/SUPPORT PAD) MISC 1 each by Does not apply route as needed. 10/27/19   Jean Rosenthal, MD    Allergies    Tegretol [carbamazepine], Depakote [divalproex sodium], Dilantin [phenytoin sodium extended], and Phenobarbital  Review of Systems   Review of Systems  Constitutional: Negative for fever.  Respiratory: Negative for shortness of breath.   Cardiovascular: Negative for chest pain.  Gastrointestinal: Negative for  abdominal pain and vomiting.  Neurological: Positive for weakness. Negative for headaches.  All other systems reviewed and are negative.   Physical Exam Updated Vital Signs BP 140/60 (BP Location: Left Arm)   Pulse 77   Temp 98.1 F (36.7 C) (Oral)   Resp 16   Ht 5\' 6"  (1.676 m)   Wt 76 kg   SpO2 93%   BMI 27.04 kg/m   Physical Exam Vitals and nursing note reviewed.  Constitutional:      General: He is not in acute distress.    Appearance: He is well-developed. He is not ill-appearing or diaphoretic.  HENT:     Head: Normocephalic and atraumatic.     Right Ear: External ear normal.     Left Ear: External ear normal.     Nose: Nose normal.  Eyes:  General:        Right eye: No discharge.        Left eye: No discharge.     Extraocular Movements: Extraocular movements intact.     Pupils: Pupils are equal, round, and reactive to light.  Cardiovascular:     Rate and Rhythm: Normal rate and regular rhythm.     Heart sounds: Normal heart sounds.  Pulmonary:     Effort: Pulmonary effort is normal.     Breath sounds: Normal breath sounds.  Abdominal:     Palpations: Abdomen is soft.     Tenderness: There is no abdominal tenderness.  Musculoskeletal:     Cervical back: Neck supple.  Skin:    General: Skin is warm and dry.  Neurological:     Mental Status: He is alert and oriented to person, place, and time.     Comments: CN 3-12 grossly intact. 5/5 strength in bilateral upper extremities. 4/5 strength in BLE.  Grossly normal sensation. Normal finger to nose.   Psychiatric:        Mood and Affect: Mood is not anxious.        Speech: Speech is slurred.     ED Results / Procedures / Treatments   Labs (all labs ordered are listed, but only abnormal results are displayed) Labs Reviewed  PROTIME-INR - Abnormal; Notable for the following components:      Result Value   Prothrombin Time 41.1 (*)    INR 4.5 (*)    All other components within normal limits  APTT -  Abnormal; Notable for the following components:   aPTT 81 (*)    All other components within normal limits  CBC - Abnormal; Notable for the following components:   RBC 3.23 (*)    Hemoglobin 9.6 (*)    HCT 31.5 (*)    Platelets 147 (*)    All other components within normal limits  COMPREHENSIVE METABOLIC PANEL - Abnormal; Notable for the following components:   CO2 33 (*)    Glucose, Bld 156 (*)    BUN 48 (*)    Creatinine, Ser 2.76 (*)    Calcium 12.3 (*)    Total Protein 6.1 (*)    Albumin 3.4 (*)    GFR calc non Af Amer 20 (*)    GFR calc Af Amer 23 (*)    All other components within normal limits  I-STAT CHEM 8, ED - Abnormal; Notable for the following components:   BUN 46 (*)    Creatinine, Ser 2.80 (*)    Glucose, Bld 155 (*)    Calcium, Ion 1.76 (*)    TCO2 34 (*)    Hemoglobin 9.9 (*)    HCT 29.0 (*)    All other components within normal limits  CBG MONITORING, ED - Abnormal; Notable for the following components:   Glucose-Capillary 166 (*)    All other components within normal limits  SARS CORONAVIRUS 2 BY RT PCR (HOSPITAL ORDER, [facility])  DIFFERENTIAL  LACTIC ACID, PLASMA  LACTIC ACID, PLASMA  BASIC METABOLIC PANEL  PARATHYROID HORMONE, INTACT (NO CA)  PROTEIN ELECTROPHORESIS, SERUM  KAPPA/LAMBDA LIGHT CHAINS  PROTIME-INR  CBC  CALCITRIOL (1,25 DI-OH VIT D)  TYPE AND SCREEN  ABO/RH    EKG EKG Interpretation  Date/Time:  Friday March 11 2020 16:09:14 EDT Ventricular Rate:  79 PR Interval:    QRS Duration: 112 QT Interval:  472 QTC Calculation: 541 R Axis:  89 Text Interpretation: Atrial fibrillation Prolonged QT Abnormal ECG No significant change since 05/27/2019 Confirmed by Veryl Speak [id number]) on 03/11/2020 5:23:15 PM   Radiology CT HEAD WO CONTRAST  Result Date: 03/11/2020 CLINICAL DATA:  Weakness and slurred speech. EXAM: CT HEAD WITHOUT CONTRAST TECHNIQUE: Contiguous axial images were obtained from the base of  the skull through the vertex without intravenous contrast. COMPARISON:  None. FINDINGS: Brain: There is mild cerebral atrophy with widening of the extra-axial spaces and ventricular dilatation. There are areas of decreased attenuation within the white matter tracts of the supratentorial brain, consistent with microvascular disease changes. A 1.8 cm x 0.9 cm well-defined partially calcified extra-axial isodense soft tissue mass is seen within the left frontal lobe. Vascular: No hyperdense vessel is identified. Skull: Normal. Negative for fracture or focal lesion. Sinuses/Orbits: There is mild to moderate severity left-sided frontal sinus mucosal thickening. Other: None. IMPRESSION: 1. Generalized cerebral atrophy. 2. No acute intracranial abnormality. 3. Findings consistent with a meningioma within the left frontal lobe. MRI correlation is recommended. Electronically Signed   By: Virgina Norfolk M.D.   On: 03/11/2020 17:48   MR BRAIN WO CONTRAST  Result Date: 03/11/2020 CLINICAL DATA:  Initial evaluation for acute right-sided weakness, slurred speech, failure to thrive. EXAM: MRI HEAD WITHOUT CONTRAST TECHNIQUE: Multiplanar, multiecho pulse sequences of the brain and surrounding structures were obtained without intravenous contrast. COMPARISON:  Prior CT from earlier the same day. FINDINGS: Brain: Examination technically limited as the patient was unable to tolerate the full length of the exam. Diffusion-weighted imaging with axial T2 and FLAIR weighted sequences only were performed. Additionally, images provided are moderately degraded by motion artifact. Generalized age-related cerebral atrophy. Mild chronic microvascular ischemic changes present within the supratentorial cerebral white matter. Remote lacunar infarcts noted at the left basal ganglia and left thalamus. No abnormal foci of restricted diffusion to suggest acute or subacute ischemia. Gray-white matter differentiation maintained. No other  appreciable areas of encephalomalacia to suggest chronic cortical infarction. No obvious evidence for acute intracranial hemorrhage. 18 mm meningioma overlies the anterior left frontal convexity (series 4, image 15). No associated mass effect. No other mass lesion. No midline shift or extra-axial fluid collection. Ventricles normal size without hydrocephalus. Vascular: Major intracranial vascular flow voids are grossly maintained at the skull base. Skull and upper cervical spine: Craniocervical junction grossly within normal limits on this motion degraded exam. Bone marrow signal intensity within normal limits. No scalp soft tissue abnormality. Sinuses/Orbits: Patient status post bilateral ocular lens replacement. Globes and orbital soft tissues demonstrate no acute finding. Right sphenoid sinus retention cyst noted. Scattered mucosal thickening noted within the frontoethmoidal sinuses. No significant mastoid effusion. Other: None. IMPRESSION: 1. Technically limited exam due to patient's inability to tolerate the full length of the exam and motion artifact. 2. No acute intracranial abnormality identified. 3. Remote lacunar infarcts involving the left basal ganglia and left thalamus. 4. 18 mm meningioma overlying the anterior left frontal convexity without associated mass effect. 5. Underlying mild chronic microvascular ischemic disease. Electronically Signed   By: Jeannine Boga M.D.   On: 03/11/2020 23:17    Procedures Procedures (including critical care time)  Medications Ordered in ED Medications  acetaminophen (TYLENOL) tablet 650 mg (has no administration in time range)  atorvastatin (LIPITOR) tablet 20 mg (20 mg Oral Given 03/11/20 2327)  metoprolol tartrate (LOPRESSOR) tablet 25 mg (25 mg Oral Given 03/11/20 2327)  zonisamide (ZONEGRAN) capsule 300 mg (has no administration in time range)  lactated ringers infusion (  has no administration in time range)  Warfarin - Pharmacist Dosing Inpatient  (has no administration in time range)  zonisamide (ZONEGRAN) capsule 300 mg (300 mg Oral Given 03/11/20 2203)    ED Course  I have reviewed the triage vital signs and the nursing notes.  Pertinent labs & imaging results that were available during my care of the patient were reviewed by me and considered in my medical decision making (see chart for details).    MDM Rules/Calculators/A&P                          No focal neuro deficits on exam.  However he is noted to have hypercalcemia which is probably somewhat dehydration as well as somewhat because he is on oral calcium.  We will start him on IV fluids.  He will need careful management of his fluids because of his CHF as well.  Otherwise, will get MRI based on the possible meningioma.  Discussed with internal medicine teaching service for admission. Final Clinical Impression(s) / ED Diagnoses Final diagnoses:  Hypercalcemia  Meningioma [facility])    Rx / DC Orders ED Discharge Orders    None       Sherwood Gambler, MD 03/11/20 [phone]

## 2020-03-11 NOTE — ED Triage Notes (Signed)
Pt arrives POV for eval of weakness, thick/slurred speech and failure to thrive per family member. Family reports pt started w/ R sided weakness 3 days ago, and slurred speech at that time. States it progressed to generalized weakness and now speech is intermittently slurred. Pt is end stage renal pt, chronically anemic, appears pale in triage. VSS.

## 2020-03-11 NOTE — Progress Notes (Signed)
ANTICOAGULATION CONSULT NOTE - Initial Consult  Pharmacy Consult for Warfarin  Indication: atrial fibrillation  Allergies  Allergen Reactions  . Tegretol [Carbamazepine] Other (See Comments)    Made patient's feet swell  . Depakote [Divalproex Sodium] Rash  . Dilantin [Phenytoin Sodium Extended] Rash  . Phenobarbital Rash    Patient Measurements: Height: 5\' 6"  (167.6 cm) Weight: 76 kg (167 lb 8.8 oz) IBW/kg (Calculated) : 63.8 Heparin Dosing Weight:   Vital Signs: Temp: 98.1 F (36.7 C) (06/18 1608) Temp Source: Oral (06/18 1608) BP: 140/60 (06/18 1608) Pulse Rate: 77 (06/18 1608)  Labs: Recent Labs    03/11/20 1627 03/11/20 1714  HGB 9.6* 9.9*  HCT 31.5* 29.0*  PLT 147*  --   APTT 81*  --   LABPROT 41.1*  --   INR 4.5*  --   CREATININE 2.76* 2.80*    Estimated Creatinine Clearance: 17.7 mL/min (A) (by C-G formula based on SCr of 2.8 mg/dL (H)).   Medical History: Past Medical History:  Diagnosis Date  . A-fib ([place])   . Abdominal aortic aneurysm (AAA) without rupture ([place]) 05/15/2018   Incidental finding on CT in 2012 Stable on ultrasound 2020  . Anemia 07/17/2018   He has a remote history of colon cancer with a colectomy at the [place].  No records available He had colonoscopy in 2015 with a 7-year follow-up recommended   . Atrial fibrillation ([place]) 04/02/2014  . Cancer ([place])   . Chronic kidney disease, stage 3, mod decreased GFR (HCC) 04/03/2014  . Diabetes mellitus without complication ([place])   . Dysphagia 02/10/2019  . Dysrhythmia   . Enlarged prostate 05/15/2018  . Gait abnormality 06/20/2018  . GI bleed 04/02/2014  . History of colon cancer 04/02/2014   Colonoscopy Dr. Deatra Ina July 2015 = 3 mm sessile polyp in the sigmoid colon.  Pathology showed tubular adenoma without high-grade dysplasia.  Surveillance colonoscopy 7 to 10 years.  . History of diabetes mellitus 02/03/2018  . Hypertension   . Left hip pain 04/14/2019  . Neuropathy 02/18/2018  . Sebaceous cyst  05/15/2018  . Seizure ([place]) 04/02/2014  . Seizures ([place])   . Systolic heart failure ([place]) 05/14/2019   Diagnosed August 2020 No ischemic work-up    Medications:  Scheduled:  . atorvastatin  20 mg Oral QHS  . metoprolol tartrate  25 mg Oral Daily  . [START ON 03/12/2020] Warfarin - Pharmacist Dosing Inpatient   Does not apply q1600  . zonisamide  300 mg Oral Once  . [START ON 03/12/2020] zonisamide  300 mg Oral BID    Assessment: Patient is a 82 yom that presented to the ED with c/o right arm weakness over the last 3 days. Patient has a hx of Afib and is on warfairn PTA for this. Pharmacy has been asked to dose warfarin at this time for Afib.   Goal of Therapy:  INR 2-3 Monitor platelets by anticoagulation protocol: Yes   Plan:  - INR is currently supra-therapeutic at 4.5  - Will hold warfarin this evening - Daily PT-INR and monitor cbc    Duanne Limerick PharmD. BCPS  03/11/2020,9:39 PM

## 2020-03-11 NOTE — ED Notes (Signed)
Pt to MRI at this time.
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
## 2020-03-11 NOTE — ED Notes (Signed)
IM paged to Dr. Regenia Skeeter per his request

## 2020-03-12 DIAGNOSIS — L89302 Pressure ulcer of unspecified buttock, stage 2: Secondary | ICD-10-CM | POA: Diagnosis present

## 2020-03-12 DIAGNOSIS — G40909 Epilepsy, unspecified, not intractable, without status epilepticus: Secondary | ICD-10-CM

## 2020-03-12 DIAGNOSIS — R531 Weakness: Secondary | ICD-10-CM

## 2020-03-12 DIAGNOSIS — L8990 Pressure ulcer of unspecified site, unspecified stage: Secondary | ICD-10-CM | POA: Insufficient documentation

## 2020-03-12 DIAGNOSIS — Z7901 Long term (current) use of anticoagulants: Secondary | ICD-10-CM

## 2020-03-12 DIAGNOSIS — N184 Chronic kidney disease, stage 4 (severe): Secondary | ICD-10-CM

## 2020-03-12 DIAGNOSIS — D631 Anemia in chronic kidney disease: Secondary | ICD-10-CM

## 2020-03-12 DIAGNOSIS — I4891 Unspecified atrial fibrillation: Secondary | ICD-10-CM

## 2020-03-12 LAB — CBC
HCT: 31 % — ABNORMAL LOW (ref 39.0–52.0)
Hemoglobin: 9.3 g/dL — ABNORMAL LOW (ref 13.0–17.0)
MCH: 29.2 pg (ref 26.0–34.0)
MCHC: 30 g/dL (ref 30.0–36.0)
MCV: 97.2 fL (ref 80.0–100.0)
Platelets: 135 10*3/uL — ABNORMAL LOW (ref 150–400)
RBC: 3.19 MIL/uL — ABNORMAL LOW (ref 4.22–5.81)
RDW: 13.6 % (ref 11.5–15.5)
WBC: 11.2 10*3/uL — ABNORMAL HIGH (ref 4.0–10.5)
nRBC: 0 % (ref 0.0–0.2)

## 2020-03-12 LAB — BASIC METABOLIC PANEL
Anion gap: 7 (ref 5–15)
BUN: 47 mg/dL — ABNORMAL HIGH (ref 8–23)
CO2: 33 mmol/L — ABNORMAL HIGH (ref 22–32)
Calcium: 12.2 mg/dL — ABNORMAL HIGH (ref 8.9–10.3)
Chloride: 102 mmol/L (ref 98–111)
Creatinine, Ser: 2.64 mg/dL — ABNORMAL HIGH (ref 0.61–1.24)
GFR calc Af Amer: 25 mL/min — ABNORMAL LOW (ref >60)
GFR calc non Af Amer: 21 mL/min — ABNORMAL LOW (ref >60)
Glucose, Bld: 141 mg/dL — ABNORMAL HIGH (ref 70–99)
Potassium: 4.6 mmol/L (ref 3.5–5.1)
Sodium: 142 mmol/L (ref 135–145)

## 2020-03-12 LAB — PROTIME-INR
INR: 4.5 (ref 0.8–1.2)
Prothrombin Time: 41.2 seconds — ABNORMAL HIGH (ref 11.4–15.2)

## 2020-03-12 LAB — GLUCOSE, CAPILLARY: Glucose-Capillary: 134 mg/dL — ABNORMAL HIGH (ref 70–99)

## 2020-03-12 LAB — LACTIC ACID, PLASMA: Lactic Acid, Venous: 1.4 mmol/L (ref 0.5–1.9)

## 2020-03-12 MED ORDER — LACTATED RINGERS IV SOLN: INTRAVENOUS | Status: AC

## 2020-03-12 MED ORDER — TORSEMIDE 20 MG PO TABS
40.0000 mg | ORAL_TABLET | Freq: Every day | ORAL | Status: DC
  Administered 2020-03-12: 40 mg via ORAL
  Filled 2020-03-12 (×2): qty 2

## 2020-03-12 NOTE — Progress Notes (Signed)
Nurse alerted by telemetry for brief drop in patient's HR. Nurse assessed patient. Patient asymptomatic, HR back within normal limits. MD paged. Will continue to monitor.  Gwendolyn Grant, RN

## 2020-03-12 NOTE — Progress Notes (Signed)
Subjective: HD 1 Overnight, patient admitted for generalized weakness in setting of hypercalcemia.   [name] was examined and evaluated at bedside this am. He mentions some improvement in his symptoms. Discussed plan to likely hypercalcemia from calcium carbonate. Mentions he has had chronic shoulder pain on his right side which has been ongoing for a while and is at baseline this morning.   Objective:  Vital signs in last 24 hours: Vitals:   03/11/20 2335 03/12/20 0130 03/12/20 0337 03/12/20 0827  BP: 140/71 122/82 (!) 124/54 (!) 121/51  Pulse: (!) 103 78 74 70  Resp: 18 20 19 16   Temp: 98.3 F (36.8 C) 99.3 F (37.4 C) 98.3 F (36.8 C) 97.7 F (36.5 C)  TempSrc: Oral Oral Oral Oral  SpO2: 96% 96% 96% 92%  Weight:      Height:       CBC Latest Ref Rng & Units 03/12/2020 03/11/2020 03/11/2020  WBC 4.0 - 10.5 K/uL 11.2(H) - 7.9  Hemoglobin 13.0 - 17.0 g/dL 9.3(L) 9.9(L) 9.6(L)  Hematocrit 39 - 52 % 31.0(L) 29.0(L) 31.5(L)  Platelets 150 - 400 K/uL 135(L) - 147(L)   BMP Latest Ref Rng & Units 03/12/2020 03/11/2020 03/11/2020  Glucose 70 - 99 mg/dL 141(H) 155(H) 156(H)  BUN 8 - 23 mg/dL 47(H) 46(H) 48(H)  Creatinine 0.61 - 1.24 mg/dL 2.64(H) 2.80(H) 2.76(H)  BUN/Creat Ratio 10 - 24 - - -  Sodium 135 - 145 mmol/L 142 143 143  Potassium 3.5 - 5.1 mmol/L 4.6 4.4 4.4  Chloride 98 - 111 mmol/L 102 99 102  CO2 22 - 32 mmol/L 33(H) - 33(H)  Calcium 8.9 - 10.3 mg/dL 12.2(H) - 12.3(H)   Physical Exam  Constitutional: Appears well-developed and well-nourished. No distress.  HENT:  Normocephalic and atraumatic. Conjunctivae are normal. EOMI, MMM Cardiovascular: Normal rate, irregular rhythm, S1 and S2 present, no murmurs/rubs/gallops appreciated  Respiratory: Effort normal and breath sounds normal. No respiratory distress. No wheezes, rales or rhonchi GI: Soft. Bowel sounds are normal. No distension. There is no tenderness.  Musculoskeletal: RLE with trace pitting edema, RLE  appears larger than LLE Neurological: Is alert and oriented x4, no apparent focal deficits noted  Skin: Not diaphoretic. No erythema.  Psychiatric: Normal mood and affect. Behavior is normal. Judgment and thought content normal.   Assessment/Plan:  [name] is an 86 year old male with PMHx of AAA without rupture, atrial fibrillation on warfarin, CKD III/IV and HFrEF (EF 30-35%) admitted for weakness in setting of hypercalcemia.   Generalized weakness: Hypercalcemia: Pateint presenting with several days of generalized weakness that is more significant in bilateral lower extremities. He notes that he has chronic right shoulder pain and limited range of motion, which is currently at baseline. Work up thus far, including CT head and MRI head, without any acute findings. Patient with Ca 12.3 on presentation in setting of recent initiation of calcium supplement. Started on gentle hydration. Ca this AM 12.2. Patient notes slight improvement in overall symptoms. His torsemide initially held; however, this may help with excretion of calcium.  - Continue gentle fluid resuscitation - Resume home torsemide 40mg  daily  - F/u PTH, vitamin D and kappa/lambda light chain results - PT/OT eval - Monitor BMP  HFrEF: Atrial fibrillation on warfarin: Patient with EF 30-35% with global LV hypokinesis in May 2021. He has a history of atrial fibrillation as well for which he is on chronic warfarin therapy. Noted to have super therapeutic INR. On examination, trace bilateral pitting  edema of lower extremities, no signs of bleeding noted . - Continue metoprolol tartrate 25mg  daily - Resume home torsemide 40mg  daily - Warfarin per pharmacy dosing  - Continue to monitor volume status in setting of gentle fluid resuscitation  CKD stage III/IV Anemia of chronic disease Renal function is currently stable. Hemoglobin currently at baseline  - Avoid nephrotoxic agents - Continue to monitor renal function     Seizure disorder: - Continue zonisamide 300mg  bid  FEN/GI: Diet: Dysphagia 3 Fluids: LR 75 cc/hr Electrolytes: Monitor and replete prn   DVT prophx: Warfarin Code status: DNR/DNI Family communication: [name], daughter, updated via telephone. All questions and concerns addressed.   Prior to Admission Living Arrangement: Home Anticipated Discharge Location: Pending PT/OT recs Barriers to Discharge: Continued medical management Dispo: Anticipated discharge in approximately 1-2 day(s).   Andrew Heck, MD  Internal Medicine, PGY-1 03/12/2020, 10:13 AM Pager: [phone] After 5pm on weekdays and 1pm on weekends: On Call Pager: [phone]

## 2020-03-12 NOTE — Evaluation (Addendum)
Clinical/Bedside Swallow Evaluation Patient Details  Name: Andrew [name] MRN: [id number] Date of Birth: 02/20/1934  Today's Date: 03/12/2020 Time: SLP Start Time (ACUTE ONLY): 0830 SLP Stop Time (ACUTE ONLY): 0846 SLP Time Calculation (min) (ACUTE ONLY): 16 min  Past Medical History:  Past Medical History:  Diagnosis Date  . A-fib ([place])   . Abdominal aortic aneurysm (AAA) without rupture ([place]) 05/15/2018   Incidental finding on CT in 2012 Stable on ultrasound 2020  . Anemia 07/17/2018   He has a remote history of colon cancer with a colectomy at the [place].  No records available He had colonoscopy in 2015 with a 7-year follow-up recommended   . Atrial fibrillation ([place]) 04/02/2014  . Cancer ([place])   . Chronic kidney disease, stage 3, mod decreased GFR (HCC) 04/03/2014  . Diabetes mellitus without complication ([place])   . Dysphagia 02/10/2019  . Dysrhythmia   . Enlarged prostate 05/15/2018  . Gait abnormality 06/20/2018  . GI bleed 04/02/2014  . History of colon cancer 04/02/2014   Colonoscopy Dr. Deatra Ina July 2015 = 3 mm sessile polyp in the sigmoid colon.  Pathology showed tubular adenoma without high-grade dysplasia.  Surveillance colonoscopy 7 to 10 years.  . History of diabetes mellitus 02/03/2018  . Hypertension   . Left hip pain 04/14/2019  . Neuropathy 02/18/2018  . Sebaceous cyst 05/15/2018  . Seizure ([place]) 04/02/2014  . Seizures ([place])   . Systolic heart failure ([place]) 05/14/2019   Diagnosed August 2020 No ischemic work-up   Past Surgical History:  Past Surgical History:  Procedure Laterality Date  . APPENDECTOMY    . [place]  . COLON SURGERY    . COLONOSCOPY N/A 04/05/2014   Procedure: COLONOSCOPY;  Surgeon: Inda Castle, MD;  Location: [facility];  Service: Endoscopy;  Laterality: N/A;   HPI:  Andrew [name] is a 86 year old with past medical history of severe bilateral carpal tunnel, dysphagia (description of food sticking, w/u pending), AAA without rupture,  atrial fibrillation on warfarin, CKD stage III/IV,  and systolic heart failure who present with weakness. The weakness began 3 days ago in his right arm and has developed into generalized weakness involving both his lower extremities. In addition his daughter ( caretaker)  noticed some slurring of his speech. She reports 10 lbs weight loss in 2 weeks and says patient has been eating normally.  In the ED patient was found to be hypercalcemic. Daughter reports patient started calcium supplementation 2 months ago.  CT head showed no acute intracranial abnormality, but calcified soft tissue mass 1.8 cm x 0.9 cm found.  MRI was suggested and MRI showed 18 mm meningioma of anterior left frontal convexity without any mass-effect.  This likely is a benign finding and not associated with any presenting symptoms.     Assessment / Plan / Recommendation Clinical Impression  Pt reports a history of dysphagia over the past year characterized by globus with solids and occasional need to regurgitate. Pt indicates sternal notch as location of food. Under observation pt able to take consecutive straw sips of liquid, but is noted to have sustained laryngeal elevation with multiple hyoid bursts per bolus. When taking pudding and solids pt has multiple single swallows and complains of sensation of residue. Suspect a possible cervical esophageal dysphagia. Additionally, pt has no dentition or dentures, and has a mild left facial droop. His voice is hypophonic and cough is subjectively weak. Given constellation of impairments, recommend instrumental assessment to better characterize  severity of dysphagia and best intevention. Pt reports he has lost weight due to inability to eat well. Will soften diet to dys 3(mech soft) with thin liquids for now and f/u for MBS when next available.  SLP Visit Diagnosis: Dysphagia, unspecified (R13.10)    Aspiration Risk  Mild aspiration risk;Risk for inadequate nutrition/hydration    Diet  Recommendation Dysphagia 3 (Mech soft);Thin liquid   Liquid Administration via: Cup;Straw Medication Administration: Crushed with puree Supervision: Patient able to self feed Compensations: Slow rate;Small sips/bites;Follow solids with liquid Postural Changes: Seated upright at 90 degrees    Other  Recommendations Oral Care Recommendations: Oral care BID   Follow up Recommendations        Frequency and Duration min 2x/week  2 weeks       Prognosis        Swallow Study   General HPI: Andrew [name] is a 86 year old with past medical history of severe bilateral carpal tunnel, dysphagia (description of food sticking, w/u pending), AAA without rupture, atrial fibrillation on warfarin, CKD stage III/IV,  and systolic heart failure who present with weakness. The weakness began 3 days ago in his right arm and has developed into generalized weakness involving both his lower extremities. In addition his daughter ( caretaker)  noticed some slurring of his speech. She reports 10 lbs weight loss in 2 weeks and says patient has been eating normally.  In the ED patient was found to be hypercalcemic. Daughter reports patient started calcium supplementation 2 months ago.  CT head showed no acute intracranial abnormality, but calcified soft tissue mass 1.8 cm x 0.9 cm found.  MRI was suggested and MRI showed 18 mm meningioma of anterior left frontal convexity without any mass-effect.  This likely is a benign finding and not associated with any presenting symptoms.   Type of Study: Bedside Swallow Evaluation Previous Swallow Assessment: none Diet Prior to this Study: Regular;Thin liquids Temperature Spikes Noted: No Respiratory Status: Room air History of Recent Intubation: No Behavior/Cognition: Alert;Cooperative;Pleasant mood Oral Cavity Assessment: Within Functional Limits Oral Care Completed by SLP: No Oral Cavity - Dentition: Edentulous Vision: Functional for self-feeding Self-Feeding Abilities:  Able to feed self Patient Positioning: Upright in bed Baseline Vocal Quality: Low vocal intensity Volitional Cough: Weak    Oral/Motor/Sensory Function Overall Oral Motor/Sensory Function: Mild impairment Facial ROM: Reduced left;Suspected CN VII (facial) dysfunction Facial Symmetry: Abnormal symmetry left;Suspected CN VII (facial) dysfunction Facial Strength: Reduced left;Suspected CN VII (facial) dysfunction Facial Sensation: Within Functional Limits Lingual ROM: Within Functional Limits Lingual Symmetry: Within Functional Limits Lingual Strength: Within Functional Limits Lingual Sensation: Within Functional Limits Velum: Within Functional Limits Mandible: Within Functional Limits   Ice Chips Ice chips: Not tested   Thin Liquid Thin Liquid: Impaired Presentation: Straw;Self Fed Pharyngeal  Phase Impairments: Multiple swallows    Nectar Thick Nectar Thick Liquid: Not tested   Honey Thick Honey Thick Liquid: Not tested   Puree Puree: Impaired Presentation: Spoon Pharyngeal Phase Impairments: Multiple swallows   Solid     Solid: Impaired Presentation: Self Fed Pharyngeal Phase Impairments: Multiple swallows     Herbie Baltimore, MA CCC-SLP  Acute Rehabilitation Services Pager [phone] Office [phone]  Lynann Beaver 03/12/2020,9:47 AM

## 2020-03-13 LAB — BASIC METABOLIC PANEL
Anion gap: 9 (ref 5–15)
BUN: 52 mg/dL — ABNORMAL HIGH (ref 8–23)
CO2: 32 mmol/L (ref 22–32)
Calcium: 11.6 mg/dL — ABNORMAL HIGH (ref 8.9–10.3)
Chloride: 98 mmol/L (ref 98–111)
Creatinine, Ser: 2.84 mg/dL — ABNORMAL HIGH (ref 0.61–1.24)
GFR calc Af Amer: 23 mL/min — ABNORMAL LOW (ref >60)
GFR calc non Af Amer: 19 mL/min — ABNORMAL LOW (ref >60)
Glucose, Bld: 217 mg/dL — ABNORMAL HIGH (ref 70–99)
Potassium: 4.1 mmol/L (ref 3.5–5.1)
Sodium: 139 mmol/L (ref 135–145)

## 2020-03-13 LAB — CBC
HCT: 32.1 % — ABNORMAL LOW (ref 39.0–52.0)
Hemoglobin: 9.9 g/dL — ABNORMAL LOW (ref 13.0–17.0)
MCH: 29.6 pg (ref 26.0–34.0)
MCHC: 30.8 g/dL (ref 30.0–36.0)
MCV: 95.8 fL (ref 80.0–100.0)
Platelets: 137 10*3/uL — ABNORMAL LOW (ref 150–400)
RBC: 3.35 MIL/uL — ABNORMAL LOW (ref 4.22–5.81)
RDW: 13.6 % (ref 11.5–15.5)
WBC: 8.9 10*3/uL (ref 4.0–10.5)
nRBC: 0 % (ref 0.0–0.2)

## 2020-03-13 LAB — PARATHYROID HORMONE, INTACT (NO CA): PTH: 8 pg/mL — ABNORMAL LOW (ref 15–65)

## 2020-03-13 LAB — PROTIME-INR
INR: 4.1 (ref 0.8–1.2)
Prothrombin Time: 38.5 seconds — ABNORMAL HIGH (ref 11.4–15.2)

## 2020-03-13 MED ORDER — METOPROLOL TARTRATE 12.5 MG HALF TABLET
12.5000 mg | ORAL_TABLET | Freq: Every day | ORAL | Status: DC
  Administered 2020-03-13 – 2020-03-21 (×9): 12.5 mg via ORAL
  Filled 2020-03-13 (×8): qty 1

## 2020-03-13 MED ORDER — LACTATED RINGERS IV SOLN: INTRAVENOUS | Status: AC

## 2020-03-13 MED ORDER — TORSEMIDE 20 MG PO TABS
40.0000 mg | ORAL_TABLET | Freq: Two times a day (BID) | ORAL | Status: DC
  Administered 2020-03-13 – 2020-03-19 (×13): 40 mg via ORAL
  Filled 2020-03-13 (×13): qty 2

## 2020-03-13 MED ORDER — LACTATED RINGERS IV BOLUS
500.0000 mL | Freq: Once | INTRAVENOUS | Status: AC
  Administered 2020-03-13: 500 mL via INTRAVENOUS

## 2020-03-13 NOTE — Progress Notes (Signed)
ANTICOAGULATION CONSULT NOTE - Initial Consult  Pharmacy Consult for Warfarin  Indication: atrial fibrillation  Allergies  Allergen Reactions  . Tegretol [Carbamazepine] Other (See Comments)    Made patient's feet swell  . Depakote [Divalproex Sodium] Rash  . Dilantin [Phenytoin Sodium Extended] Rash  . Phenobarbital Rash    Patient Measurements: Height: 5\' 6"  (167.6 cm) Weight: 76 kg (167 lb 8.8 oz) IBW/kg (Calculated) : 63.8  Vital Signs: Temp: 97.4 F (36.3 C) (06/20 0428) Temp Source: Oral (06/20 0428) BP: 115/61 (06/20 0428) Pulse Rate: 63 (06/20 0428)  Labs: Recent Labs    03/11/20 1627 03/11/20 1627 03/11/20 1714 03/12/20 0425  HGB 9.6*   < > 9.9* 9.3*  HCT 31.5*  --  29.0* 31.0*  PLT 147*  --   --  135*  APTT 81*  --   --   --   LABPROT 41.1*  --   --  41.2*  INR 4.5*  --   --  4.5*  CREATININE 2.76*  --  2.80* 2.64*   < > = values in this interval not displayed.    Estimated Creatinine Clearance: 18.8 mL/min (A) (by C-G formula based on SCr of 2.64 mg/dL (H)).   Medical History: Past Medical History:  Diagnosis Date  . A-fib ([place])   . Abdominal aortic aneurysm (AAA) without rupture ([place]) 05/15/2018   Incidental finding on CT in 2012 Stable on ultrasound 2020  . Anemia 07/17/2018   He has a remote history of colon cancer with a colectomy at the [place].  No records available He had colonoscopy in 2015 with a 7-year follow-up recommended   . Atrial fibrillation ([place]) 04/02/2014  . Cancer ([place])   . Chronic kidney disease, stage 3, mod decreased GFR ([facility]) 04/03/2014  . Diabetes mellitus without complication ([place])   . Dysphagia 02/10/2019  . Dysrhythmia   . Enlarged prostate 05/15/2018  . Gait abnormality 06/20/2018  . GI bleed 04/02/2014  . History of colon cancer 04/02/2014   Colonoscopy Dr. Deatra Ina July 2015 = 3 mm sessile polyp in the sigmoid colon.  Pathology showed tubular adenoma without high-grade dysplasia.  Surveillance colonoscopy 7 to 10 years.  .  History of diabetes mellitus 02/03/2018  . Hypertension   . Left hip pain 04/14/2019  . Neuropathy 02/18/2018  . Sebaceous cyst 05/15/2018  . Seizure ([place]) 04/02/2014  . Seizures ([place])   . Systolic heart failure ([place]) 05/14/2019   Diagnosed August 2020 No ischemic work-up    Medications:  Scheduled:  . atorvastatin  20 mg Oral QHS  . metoprolol tartrate  25 mg Oral Daily  . torsemide  40 mg Oral Daily  . Warfarin - Pharmacist Dosing Inpatient   Does not apply q1600  . zonisamide  300 mg Oral BID    Assessment: Patient is a 61 yom that presented to the ED with c/o right arm weakness. Patient has a hx of Afib and is on warfarin PTA for this.  Home warfarin regimen is 6 mg on Tuesday, Thursday, Saturday and 3 mg on all other days.  Pharmacy consulted to dose warfarin at this time for Afib.   Yesterday's INR supratherapeutic at 4.5 and no warfarin was administered.  Today's INR remains supratherapeutic at 4.5. No signs of bleeding noted.  Goal of Therapy:  INR 2-3 Monitor platelets by anticoagulation protocol: Yes   Plan:  Hold warfarin this evening Monitor daily INR, CBC, s/s bleeding   Efraim Kaufmann, PharmD, BCPS  03/13/2020,7:18 AM

## 2020-03-13 NOTE — Progress Notes (Signed)
Patient EKG complete. Copy in chart. MD notified. Gwendolyn Grant, RN

## 2020-03-13 NOTE — Evaluation (Addendum)
Occupational Therapy Evaluation Patient Details Name: Andrew [name] MRN: [id number] DOB: May 28, 1934 Today's Date: 03/13/2020    History of Present Illness 86 y.o. male admitted with weakness on 6/18.  MRI/CT on 03/11/20 findings consistent with a meningioma within the left frontal lobe. Remote lacunar infarcts involving the left basal ganglia and left thalamus. Past medical history of severe bilateral carpal tunnel, dysphagia, AAA without rupture, atrial fibrillation on warfarin, CKD stage III/IV,  and systolic heart failure.   Clinical Impression   PTA pt living with daughter, receiving as needed assist for BADL/IADL. At time of eval, pt was up in chair per his request due to c/o bed hurting tail bone. Pt able to complete sit <> stands with mod A and use of stedy. Once in stedy, pt able to march in place ~30 seconds x3. Noted RUE coordination and motor planning deficits with evaluation. He is currently a max A for LB BADLs. Noted cognitive deficits as described below. Given current status recommend SNF level rehab at d/c for continued progression of BADL independence prior to returning home. Family prefers [facility]. Will continue to follow per POC listed below.     Follow Up Recommendations  SNF;Supervision/Assistance - 24 hour    Equipment Recommendations  None recommended by OT    Recommendations for Other Services       Precautions / Restrictions Precautions Precautions: Fall Restrictions Weight Bearing Restrictions: No      Mobility Bed Mobility               General bed mobility comments: in recliner at start of session  Transfers Overall transfer level: Needs assistance Equipment used: Ambulation equipment used Transfers: Sit to/from Stand Sit to Stand: Mod assist         General transfer comment: stedy used for sit <> stands, pt able to complete with mod A    Balance Overall balance assessment: Needs assistance Sitting-balance support: Bilateral upper  extremity supported Sitting balance-Leahy Scale: Fair       Standing balance-Leahy Scale: Poor Standing balance comment: reliant on external support                           ADL either performed or assessed with clinical judgement   ADL Overall ADL's : Needs assistance/impaired Eating/Feeding: Minimal assistance;Sitting Eating/Feeding Details (indicate cue type and reason): daughter assisting in feeding, although pt encouraged to feed self. Grooming: Set up;Sitting   Upper Body Bathing: Minimal assistance;Moderate assistance;Sitting   Lower Body Bathing: Maximal assistance;Sit to/from stand;Sitting/lateral leans   Upper Body Dressing : Set up;Sitting   Lower Body Dressing: Maximal assistance;Sitting/lateral leans;Sit to/from stand   Toilet Transfer: Moderate assistance;Regular Glass blower/designer Details (indicate cue type and reason): simulated with recliner and use of stedy for sit <> stand Toileting- Clothing Manipulation and Hygiene: Maximal assistance;Sitting/lateral lean;Sit to/from stand       Functional mobility during ADLs: Moderate assistance;Cueing for safety (stedy)       Vision Baseline Vision/History: Wears glasses Wears Glasses: At all times Patient Visual Report: No change from baseline       Perception     Praxis      Pertinent Vitals/Pain Pain Assessment: No/denies pain     Hand Dominance     Extremity/Trunk Assessment Upper Extremity Assessment Upper Extremity Assessment: Generalized weakness;RUE deficits/detail RUE Deficits / Details: increased weakness compared to L. Noted poor motor planning and coordination with finger to nose test RUE Coordination: decreased fine motor;decreased  gross motor   Lower Extremity Assessment Lower Extremity Assessment: Defer to PT evaluation       Communication Communication Communication: No difficulties   Cognition Arousal/Alertness: Awake/alert Behavior During Therapy: Flat  affect Overall Cognitive Status: Impaired/Different from baseline Area of Impairment: Following commands;Safety/judgement;Problem solving                       Following Commands: Follows one step commands with increased time Safety/Judgement: Decreased awareness of safety;Decreased awareness of deficits   Problem Solving: Slow processing;Decreased initiation;Difficulty sequencing;Requires verbal cues General Comments: pt is able to state that he feels weaker on R vs L but does not show much awareness or attention to this during activity. Increased time and repetition needed for basic mobility commands   General Comments       Exercises     Shoulder Instructions      Home Living Family/patient expects to be discharged to:: Skilled nursing facility                                 Additional Comments: Pt lives in multilevel home, daughter would like pt to be d/c to [facility] for rehab upon d/c.      Prior Functioning/Environment Level of Independence: Independent with assistive device(s)        Comments: Requires use of rollator, has assistance at home occasionally, has a cane and rollator at home        OT Problem List: Decreased strength;Decreased knowledge of use of DME or AE;Decreased coordination;Decreased activity tolerance;Decreased cognition;Impaired balance (sitting and/or standing);Decreased safety awareness      OT Treatment/Interventions: Self-care/ADL training;Therapeutic exercise;Patient/family education;Balance training;Neuromuscular education;Energy conservation;Therapeutic activities;DME and/or AE instruction    OT Goals(Current goals can be found in the care plan section) Acute Rehab OT Goals Patient Stated Goal: go to SNF OT Goal Formulation: With patient/family Time For Goal Achievement: 03/27/20 Potential to Achieve Goals: Good  OT Frequency: Min 2X/week   Barriers to D/C:            Co-evaluation              AM-PAC  OT "6 Clicks" Daily Activity     Outcome Measure Help from another person eating meals?: A Little Help from another person taking care of personal grooming?: A Little Help from another person toileting, which includes using toliet, bedpan, or urinal?: A Lot Help from another person bathing (including washing, rinsing, drying)?: A Lot Help from another person to put on and taking off regular upper body clothing?: A Little Help from another person to put on and taking off regular lower body clothing?: A Lot 6 Click Score: 15   End of Session Nurse Communication: Mobility status  Activity Tolerance: Patient tolerated treatment well Patient left: in chair;with call bell/phone within reach;with family/visitor present  OT Visit Diagnosis: Unsteadiness on feet (R26.81);Other abnormalities of gait and mobility (R26.89);Other symptoms and signs involving cognitive function                Time: 9767-3419 OT Time Calculation (min): 14 min Charges:  OT General Charges $OT Visit: 1 Visit OT Evaluation $OT Eval Moderate Complexity: [place], MSOT, OTR/L [facility] Office Number: [phone] Pager: [phone]  Zenovia Jarred 03/13/2020, 4:04 PM

## 2020-03-13 NOTE — Progress Notes (Signed)
Speech Language Pathology Treatment: Dysphagia  Patient Details Name: Andrew [name] MRN: [id number] DOB: [birth year] Today's Date: 03/13/2020 Time: 7062-3762 SLP Time Calculation (min) (ACUTE ONLY): 30 min  Assessment / Plan / Recommendation Clinical Impression  Educated daughters and pt to increase pna risk given his weak cough and decreased mobility which is increased with his dysphagia. Observed pt with intake of Ensure and chocolate icecream via straw- No overt indication of aspiration but multiple swallows and frequent throat clearing noted.  Family reports pt clears his throat frequently for "years". Note per Dr Zenovia Jarred office visit in 04/2019, post-nasal drainage was questioned.    Pt with left eye decreased opening and mild facial asymmetry.  Palatal elevation bilateral and minimal lingual weakness noted on the left -but pt reports weakness on the right side.  Daughters stated decreased left eye opening started the night before admit.    Informed family of recommendation to maximize liquid nutrition if pt has more problems with foods.  Pt is edentulous and admits he may not be able to masticate adequately.   Per daughter, pt dislikes Ensures, but he drank nearly all of his Ensure with ice cream (chocolate) SLP provided for him.  Daughter reports she will provide this for him at home.      Reports sensation of food and pills *and sometimes liquids* requiring him to "bring it back up" to the level of the throat and re-swallow.  He also admits to occasionally expectorating food, not accompanied by secretions, causing SLP to suspect possible pharyngo-cervical esophageal dysphagia.   One daughter spends the night with the pt frequently and states she will do this from now on.  Reviewed universal sign of choking and heimlich maneuver with pt/family.   MBS indicated and is now scheduled with xray for Monday 03/14/2020.   SLP Informed pt's nurse Hollie Salk and advised would change diet to dys2.   Requested RN inform family of changes.  Thanks.    HPI HPI: Andrew [name] is a 86 year old with past medical history of severe bilateral carpal tunnel, dysphagia (description of food sticking, w/u pending), AAA without rupture, atrial fibrillation on warfarin, CKD stage III/IV,  and systolic heart failure who present with weakness. The weakness began 3 days ago in his right arm and has developed into generalized weakness involving both his lower extremities. In addition his daughter ( caretaker)  noticed some slurring of his speech. She reports 10 lbs weight loss in 2 weeks and says patient has been eating normally.  In the ED patient was found to be hypercalcemic. Daughter reports patient started calcium supplementation 2 months ago.  CT head showed no acute intracranial abnormality, but calcified soft tissue mass 1.8 cm x 0.9 cm found.  MRI was suggested and MRI showed 18 mm meningioma of anterior left frontal convexity without any mass-effect.  This likely is a benign finding and not associated with any presenting symptoms per notes.  Pt today seen in case MBS unable to be conducted dependent on xray scheduling.  Family present and daughters report pt has had a progressive dysphagia over the last year.  He is edentulous and he admits his dysphagia is impacting his intake in a negative manner.      SLP Plan  MBS (next date)       Recommendations  Diet recommendations: Dysphagia 2 (fine chop);Thin liquid Liquids provided via: Cup;Straw Medication Administration: Crushed with puree Compensations: Slow rate;Small sips/bites;Follow solids with liquid Postural Changes and/or Swallow Maneuvers: Seated upright  90 degrees;Upright 30-60 min after meal                Oral Care Recommendations: Oral care BID SLP Visit Diagnosis: Dysphagia, unspecified (R13.10) Plan: MBS (next date)       [place], MS [place]   Macario Golds 03/13/2020, 1:14 PM

## 2020-03-13 NOTE — Evaluation (Signed)
Physical Therapy Evaluation Patient Details Name: Andrew [name] MRN: [id number] DOB: [birth year] Today's Date: 03/13/2020   History of Present Illness  86 y.o. male admitted with weakness on 6/18.  MRI/CT on 03/11/20 findings consistent with a meningioma within the left frontal lobe. Remote lacunar infarcts involving the left basal ganglia and left thalamus. Past medical history of severe bilateral carpal tunnel, dysphagia, AAA without rupture, atrial fibrillation on warfarin, CKD stage III/IV,  and systolic heart failure.  Clinical Impression  Pt presents with an overall decrease in functional mobility, decrease in balance, decrease in strength secondary to above. PTA, pt lives at home with occasional assistance from daughter and caregivers. Daughter reports pt gets benefits from [place]. Today, pt required max(A), total (A) to power up +1 assist to attempt to sit to stand x3. Pt able to sit to stand +2 min(A) for assistance with power up and balance in stance. In stance pt demonstrated posterior lean that slightly corrected itself with amb forward/back 3 feet. Taught pt LE therex to complete while in recliner to assist with muscle activation and LE strengthening. Discussed d/c to SNF and daughter and patient agreed. Pt would benefit from continued acute PT services to maximize functional mobility and independence prior to d/c to next venue of care.    Follow Up Recommendations SNF (Daughter reports she would like [facility])    Equipment Recommendations  None recommended by PT    Recommendations for Other Services       Precautions / Restrictions Precautions Precautions: Fall Precaution Comments: watch vital signs Restrictions Weight Bearing Restrictions: No      Mobility  Bed Mobility               General bed mobility comments: in recliner at start of session  Transfers Overall transfer level: Needs assistance Equipment used: Rolling walker (2 wheeled) Transfers: Sit to/from  Stand Sit to Stand: Min assist;+2 physical assistance         General transfer comment: Pt unable to stand with +1 assistance, max-total to stand, attempted x3. Pt able to initate standing but not power up, pt min(A) +2 for power up with full stand.  Ambulation/Gait Ambulation/Gait assistance: Min assist Gait Distance (Feet): 3 Feet Assistive device: Rolling walker (2 wheeled) Gait Pattern/deviations: Step-to pattern;Decreased stride length;Wide base of support;Leaning posteriorly Gait velocity: decreased   General Gait Details: Pt required min(A) to correct posterior lean in standing with foward amb. Pt able to take few steps forward with min(A), would recommend chair follow for further amb.  Stairs            Wheelchair Mobility    Modified Rankin (Stroke Patients Only) Modified Rankin (Stroke Patients Only) Pre-Morbid Rankin Score: Moderate disability Modified Rankin: Moderately severe disability     Balance Overall balance assessment: Needs assistance Sitting-balance support: Bilateral upper extremity supported Sitting balance-Leahy Scale: Fair Sitting balance - Comments: Pt able to use abdominal muscles to bring trunk forward in sitting, to prepare for stance     Standing balance-Leahy Scale: Poor Standing balance comment: Pt required assistance to sit to stand and used RW, increased posterior lean in stance.                             Pertinent Vitals/Pain Pain Assessment: No/denies pain    Home Living Family/patient expects to be discharged to:: Skilled nursing facility  Additional Comments: Pt lives in multilevel home, daughter would like pt to be d/c to [facility] for rehab upon d/c.    Prior Function Level of Independence: Independent with assistive device(s)         Comments: Requires use of rollator, has assistance at home occasionally, has a cane and rollator at home     Hand Dominance        Extremity/Trunk  Assessment   Upper Extremity Assessment Upper Extremity Assessment: Defer to OT evaluation    Lower Extremity Assessment Lower Extremity Assessment: RLE deficits/detail;LLE deficits/detail RLE Deficits / Details: 2+/5 MMT for hips, knees. 3+/5 PF, DF. Pt reports R weaker than L, no LE difference noted in muscle strength screening. LLE Deficits / Details: 2+/5 MMT for hips, knees. 3+/5 PF, DF       Communication   Communication: No difficulties  Cognition Arousal/Alertness: Awake/alert Behavior During Therapy: WFL for tasks assessed/performed Overall Cognitive Status: Impaired/Different from baseline Area of Impairment: Following commands;Problem solving                       Following Commands: Follows multi-step commands with increased time     Problem Solving: Slow processing;Decreased initiation;Difficulty sequencing;Requires verbal cues General Comments: Pt aware of deficits and weakness, able to verbalize that his R side felt weaker than left. Pt A&Ox4. Required increased time for mobility, for mobility and demonstrated decreased initation with mobility.      General Comments General comments (skin integrity, edema, etc.): Pt VSS during session, BP slightly low asymptomatic.    Exercises Total Joint Exercises Ankle Circles/Pumps: AROM;10 reps;Both;Seated Long Arc Quad: AROM;Both;10 reps;Seated General Exercises - Lower Extremity Hip Flexion/Marching: AROM;Seated;10 reps;Both   Assessment/Plan    PT Assessment Patient needs continued PT services  PT Problem List Decreased strength;Decreased mobility;Decreased range of motion;Decreased activity tolerance;Cardiopulmonary status limiting activity;Decreased balance;Decreased knowledge of use of DME       PT Treatment Interventions DME instruction;Therapeutic activities;Gait training;Therapeutic exercise;Patient/family education;Balance training;Functional mobility training;Neuromuscular re-education;Stair training     PT Goals (Current goals can be found in the Care Plan section)  Acute Rehab PT Goals Patient Stated Goal: go to SNF PT Goal Formulation: With patient/family Time For Goal Achievement: 03/27/20 Potential to Achieve Goals: Good    Frequency Min 2X/week   Barriers to discharge   Pt daughter present in room,, reports he can get services from [place], unable to have 24/7 family support    Co-evaluation               AM-PAC PT "6 Clicks" Mobility  Outcome Measure Help needed turning from your back to your side while in a flat bed without using bedrails?: A Little Help needed moving from lying on your back to sitting on the side of a flat bed without using bedrails?: A Little Help needed moving to and from a bed to a chair (including a wheelchair)?: A Lot Help needed standing up from a chair using your arms (e.g., wheelchair or bedside chair)?: A Little Help needed to walk in hospital room?: A Lot Help needed climbing 3-5 steps with a railing? : A Lot 6 Click Score: 15    End of Session Equipment Utilized During Treatment: Gait belt Activity Tolerance: Patient tolerated treatment well Patient left: in chair;with call bell/phone within reach;with family/visitor present;with nursing/sitter in room Nurse Communication: Mobility status PT Visit Diagnosis: Other abnormalities of gait and mobility (R26.89);History of falling (Z91.81);Muscle weakness (generalized) (M62.81);Unsteadiness on feet (R26.81)  Time: 7106-2694 PT Time Calculation (min) (ACUTE ONLY): 26 min   Charges:   PT Evaluation $PT Eval Moderate Complexity: 1 Mod PT Treatments $Therapeutic Exercise: 8-22 mins        Rolland Porter SPT 03/13/2020   Rolland Porter 03/13/2020, 11:17 AM

## 2020-03-13 NOTE — Progress Notes (Signed)
Subjective:  Andrew [name] is a 86 y.o. with PMH of systolic heart failure, persistent A.fib on Warfarin, HTN, AAA, CVA, Seizure disorder, CKD3 and meningioma admit for symptomatic hypercalcemia on hospital day 2  [name] was examined and evaluated at bedside this am. He mentions this morning having brief episode of chest pain. Described as pressure that occurred closer to the abdomen and radiated up to chest while watching TV. Currently symptom has resolved. Denies any associated diaphoresis, nausea, vomiting, dyspnea. Did endorse palpitations.   Objective:  Vital signs in last 24 hours: Vitals:   03/12/20 1518 03/12/20 1929 03/13/20 0015 03/13/20 0428  BP: (!) 110/48 (!) 113/53 (!) 120/56 115/61  Pulse: (!) 58 62 61 63  Resp: 16 18 18 18   Temp: (!) 97.4 F (36.3 C) (!) 97.5 F (36.4 C) 97.7 F (36.5 C) (!) 97.4 F (36.3 C)  TempSrc: Oral Oral Oral Oral  SpO2: 94% 95% 95% 94%  Weight:      Height:       Gen: Chronically ill-appearing, NAD Neck: supple, ROM intact, no JVD CV: Irregularly irregular, S1, S2 normal, No rubs, no murmurs, no gallops Pulm: CTAB, No rales, no wheezes Abd: Soft, BS+, NTND, No rebound, no guarding Extm: ROM intact, Peripheral pulses intact, No peripheral edema Skin: Dry, Warm, poor turgor.  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 with PMH of systolic heart failure, persistent A.fib on Warfarin, HTN, AAA, CVA, Seizure disorder, CKD4 and meningioma admit for symptomatic hypercalcemia.  Chest pain Noted to endorse chest pain overnight at rest. Radiation from abdomen. Description unusual for cardiac  pain but does have significant cardiac hx. Telemetry shows some bradycardia and occasional PVCs. Possibly symptomatic PVCs. Will get 12-lead EKG to assess for ischemic changes. - 12-lead EKG  Generalized Weakness 2/2 Symptomatic Iatrogenic Hypercalcemia Presents w/ bilateral lower extremity weakness due to hyperclacemia. Noted to have started calcium carbonate recently. Getting torsemide. Ca 12.2->11.6. Continues to endorse weakness. PTH, vit D, mm labs pending - Increase LR to 100cc/hr - Increase torsemide to 40mg  BID - F/u PTh, Vitamin D, kappa/lambda light chains - Trend BMP  Permanent Atrial Fibrillation Rate controlled w/ metoprolol at home. On coumadin with supra-therapeutic INR downtrending (4.5->4.1). Noted bradycardia overnight. - Hold Warfarin until INR reach therapeutic level - Decrease metoprolol to 12.5mg  daily  Chronic systolic heart failure Appear euvolemic on exam. Last Echo w/ EF 30-35% w/ global LV hypokinesis. Per cardiology notes, due to long-standing hypertension. Not candidate for invasive testing for further work-up per family wishes. - Not a candidate for entresto or spironolactone due to renal dysfunction - C/w torsemide 40mg  BID - Telemetry  CVA  Initial work-up included MRI which showed chronic infarcts. On atorvastatin 20mg  due to side effects - C/w home meds: atorvastatin 20mg   CKD stage 4 Baseline BUN 48, Creatinine 2.76. Renal fx stable since admission. Creatinine 2.76->2.8->2.64->2.84. - Trend renal fx - Avoid nephrotoxic meds when able  Anemia of Chronic Disease Baseline hemoglobin 10.3. Stable anemia in setting of supra-therapeutic INR. No evidnece of acute bleed. Hgb 9.9->9.3->9.9 - Trend cbc - Monitor for bleed  DVT  prophx: Warfarin Diet: DYS3 Code: DNR/DNI  Prior to Admission Living Arrangement: Home Anticipated Discharge Location: SNF Barriers to Discharge: medical treatment Dispo: Anticipated discharge in approximately 1-2 day(s).    [name], MD 03/13/2020, 6:46 AM Pager: [phone] After 5pm on weekdays and 1pm on weekends: On Call Pager: [phone]

## 2020-03-14 ENCOUNTER — Inpatient Hospital Stay (HOSPITAL_COMMUNITY): Payer: No Typology Code available for payment source

## 2020-03-14 DIAGNOSIS — R079 Chest pain, unspecified: Secondary | ICD-10-CM

## 2020-03-14 DIAGNOSIS — D329 Benign neoplasm of meninges, unspecified: Secondary | ICD-10-CM

## 2020-03-14 DIAGNOSIS — J9691 Respiratory failure, unspecified with hypoxia: Secondary | ICD-10-CM

## 2020-03-14 LAB — BASIC METABOLIC PANEL
Anion gap: 8 (ref 5–15)
BUN: 55 mg/dL — ABNORMAL HIGH (ref 8–23)
CO2: 35 mmol/L — ABNORMAL HIGH (ref 22–32)
Calcium: 12.2 mg/dL — ABNORMAL HIGH (ref 8.9–10.3)
Chloride: 96 mmol/L — ABNORMAL LOW (ref 98–111)
Creatinine, Ser: 2.87 mg/dL — ABNORMAL HIGH (ref 0.61–1.24)
GFR calc Af Amer: 22 mL/min — ABNORMAL LOW (ref >60)
GFR calc non Af Amer: 19 mL/min — ABNORMAL LOW (ref >60)
Glucose, Bld: 154 mg/dL — ABNORMAL HIGH (ref 70–99)
Potassium: 4.5 mmol/L (ref 3.5–5.1)
Sodium: 139 mmol/L (ref 135–145)

## 2020-03-14 LAB — PROTIME-INR
INR: 3.5 — ABNORMAL HIGH (ref 0.8–1.2)
Prothrombin Time: 33.7 seconds — ABNORMAL HIGH (ref 11.4–15.2)

## 2020-03-14 LAB — CBC
HCT: 30.7 % — ABNORMAL LOW (ref 39.0–52.0)
Hemoglobin: 9.3 g/dL — ABNORMAL LOW (ref 13.0–17.0)
MCH: 28.8 pg (ref 26.0–34.0)
MCHC: 30.3 g/dL (ref 30.0–36.0)
MCV: 95 fL (ref 80.0–100.0)
Platelets: 132 10*3/uL — ABNORMAL LOW (ref 150–400)
RBC: 3.23 MIL/uL — ABNORMAL LOW (ref 4.22–5.81)
RDW: 13.4 % (ref 11.5–15.5)
WBC: 8.7 10*3/uL (ref 4.0–10.5)
nRBC: 0 % (ref 0.0–0.2)

## 2020-03-14 LAB — KAPPA/LAMBDA LIGHT CHAINS
Kappa free light chain: 64.5 mg/L — ABNORMAL HIGH (ref 3.3–19.4)
Kappa, lambda light chain ratio: 1.7 — ABNORMAL HIGH (ref 0.26–1.65)
Lambda free light chains: 38 mg/L — ABNORMAL HIGH (ref 5.7–26.3)

## 2020-03-14 LAB — ALBUMIN: Albumin: 3 g/dL — ABNORMAL LOW (ref 3.5–5.0)

## 2020-03-14 LAB — PHOSPHORUS: Phosphorus: 4.7 mg/dL — ABNORMAL HIGH (ref 2.5–4.6)

## 2020-03-14 MED ORDER — SODIUM CHLORIDE 0.9 % IV SOLN: INTRAVENOUS | Status: DC

## 2020-03-14 MED ORDER — LACTATED RINGERS IV SOLN: INTRAVENOUS | Status: DC

## 2020-03-14 NOTE — Progress Notes (Signed)
Modified Barium Swallow Progress Note  Patient Details  Name: Andrew [name] MRN: [id number] Date of Birth: 20-Jun-1934  Today's Date: 03/14/2020  Modified Barium Swallow completed.  Full report located under Chart Review in the Imaging Section.  Brief recommendations include the following:  Clinical Impression  Fluoroscopy reveals large bony protrusion on cervical spine at C4/5 that impedes epiglottic deflection. Pt is able to tolerate thin liquids with mutiple hyoid bursts and sustained laryngeal closure with mild pyriform residue. There were instances of trace frank penetration of residue post swallow. More concerning was the severe vallecular residue with puree and solids. Unfortunately a head turn right or left did not fully divert epiglottis and did not aid in bolus transit. Only mutiple hard liquid washes cleared 75% of residue. Pt is at high risk of poor nutritional support. Suspect that decreased oral intake has contributed to weakness and made swallowing even worse. Recommend a puree diet with thin liqudis, though liquid supplements are the best choice. Will f/u for education with pt and family.    Swallow Evaluation Recommendations       SLP Diet Recommendations: Dysphagia 1 (Puree) solids;Thin liquid   Liquid Administration via: Straw   Medication Administration: Crushed with puree   Supervision: Staff to assist with self feeding   Compensations: Minimize environmental distractions;Slow rate;Small sips/bites;Follow solids with liquid   Postural Changes: Seated upright at 90 degrees;Remain semi-upright after after feeds/meals (Comment)   Oral Care Recommendations: Oral care BID   Other Recommendations: Have oral suction available   Herbie [place], MA [place] Pager [phone] Office [phone]  Lynann Beaver 03/14/2020,12:28 PM

## 2020-03-14 NOTE — TOC Initial Note (Signed)
Transition of Care [facility]) - Initial/Assessment Note    Patient Details  Name: Andrew [name] MRN: [id number] Date of Birth: 03-Aug-1934  [facility]) CM/SW Contact:    Bethann Berkshire, [place] Phone Number: 03/14/2020, 1:45 PM  Clinical Narrative:                  CSW met with pt and daughter, Andrew [name], bedside regarding SNF recommendation. Pt and daughter agreeable to SNF and prefer [facility] in [place]. Fl2 completed and referral faxed to [facility].   Contacted Danielle from [facility] and confirmed bed offer. Stated they can take any day this week. Stated that pt would be [employer] for auth and that [facility] at [facility] will start [facility]. Informed Danielle from [facility] that pt is not vaccinated.   Expected Discharge Plan: Skilled Nursing Facility Barriers to Discharge: Continued Medical Work up   Patient Goals and CMS Choice Patient states their goals for this hospitalization and ongoing recovery are:: [facility] SN[facility]      Expected Discharge Plan and Services Expected Discharge Plan: [place]       Living arrangements for the past 2 months: Single Family Home                                      Prior Living Arrangements/Services Living arrangements for the past 2 months: Single Family Home Lives with:: Self Patient language and need for interpreter reviewed:: Yes        Need for Family Participation in Patient Care: Yes (Comment) Care giver support system in place?: Yes (comment)   Criminal Activity/Legal Involvement Pertinent to Current Situation/Hospitalization: No - Comment as needed  Activities of Daily Living      Permission Sought/Granted   Permission granted to share information with : Yes, Verbal Permission Granted  Share Information with NAME: Aaliyah, [place] (Daughter) [phone] (Mobile)           Emotional Assessment Appearance:: Appears stated age Attitude/Demeanor/Rapport: Lethargic Affect (typically observed): Flat Orientation: : Oriented to  Self, Oriented to Place, Oriented to  Time, Oriented to Situation Alcohol / Substance Use: Not Applicable Psych Involvement: No (comment)  Admission diagnosis:  Hypercalcemia [E83.52] Meningioma ([place]) [D32.9] Patient Active Problem List   Diagnosis Date Noted  . Pressure injury of skin 03/12/2020  . Pressure injury of buttock, stage 2 ([place]) 03/12/2020  . Hypercalcemia 03/11/2020  . Bilateral lower extremity edema 10/27/2019  . Systolic heart failure ([place]) 05/14/2019  . Left hip pain 04/14/2019  . Dysphagia 02/10/2019  . Anemia 07/17/2018  . Gait abnormality 06/20/2018  . Enlarged prostate 05/15/2018  . Abdominal aortic aneurysm (AAA) without rupture ([place]) 05/15/2018  . Sebaceous cyst 05/15/2018  . Neuropathy 02/18/2018  . History of diabetes mellitus 02/03/2018  . Chronic kidney disease, stage 3/4, mod decreased GFR ([place]) 04/03/2014  . Atrial fibrillation ([place]) 04/02/2014  . History of colon cancer 04/02/2014  . Seizure ([place]) 04/02/2014   PCP:  Bartholomew Crews, MD Pharmacy:   [id number] [place], [id number]: [phone]: [phone]    Social Determinants of Health (SDOH) Interventions    Readmission Risk Interventions No flowsheet data found.

## 2020-03-14 NOTE — Progress Notes (Signed)
Subjective: HD 3 No acute overnight events reported.  [name] was evaluated at bedside this morning. He is somnolent on exam, but easily arousable. He denies any current pain.   Patient's daughter, Andrew [name], at bedside. She notes that patient had brief episode of back pain radiating to his chest overnight that subsided without intervention. She is concerned regarding patient's overall inability to eat and progressively worsening lethargy and decreased PO intake.   Objective:  Vital signs in last 24 hours: Vitals:   03/13/20 0733 03/13/20 1534 03/13/20 2021 03/13/20 2100  BP: 119/61 (!) 114/48 (!) 131/48 129/69  Pulse: 69 (!) 51 75 87  Resp: 20 16 16 20   Temp: 97.6 F (36.4 C) (!) 97.3 F (36.3 C) 97.6 F (36.4 C) 98 F (36.7 C)  TempSrc: Oral Oral Oral   SpO2: 94% 96% 99% 91%  Weight:      Height:       CBC Latest Ref Rng & Units 03/14/2020 03/13/2020 03/12/2020  WBC 4.0 - 10.5 K/uL 8.7 8.9 11.2(H)  Hemoglobin 13.0 - 17.0 g/dL 9.3(L) 9.9(L) 9.3(L)  Hematocrit 39 - 52 % 30.7(L) 32.1(L) 31.0(L)  Platelets 150 - 400 K/uL 132(L) 137(L) 135(L)   BMP Latest Ref Rng & Units 03/14/2020 03/13/2020 03/12/2020  Glucose 70 - 99 mg/dL 154(H) 217(H) 141(H)  BUN 8 - 23 mg/dL 55(H) 52(H) 47(H)  Creatinine 0.61 - 1.24 mg/dL 2.87(H) 2.84(H) 2.64(H)  BUN/Creat Ratio 10 - 24 - - -  Sodium 135 - 145 mmol/L 139 139 142  Potassium 3.5 - 5.1 mmol/L 4.5 4.1 4.6  Chloride 98 - 111 mmol/L 96(L) 98 102  CO2 22 - 32 mmol/L 35(H) 32 33(H)  Calcium 8.9 - 10.3 mg/dL 12.2(H) 11.6(H) 12.2(H)   Physical Exam  Constitutional: Chronically ill appearing elderly male, no acute distress  Head: Normocephalic and atraumatic. Edentulous, MMM Cardiovascular: Irregularly irregular rhythm, S1 and S2 present, no murmurs/rubs/gallops  Respiratory: Effort normal and breath sounds normal. No respiratory distress. No wheezes or rales noted.  GI: Soft. Bowel sounds are normal. No distension. There is no  tenderness.  Extremities: ROM intake, distal pulses present, no peripheral edema noted Neurological: Is alert and oriented x3, no apparent focal deficits noted.   Skin: Not diaphoretic. No erythema.   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 is an 86 yr old with PMHx of systolic heart failure (EF 30-35%), atrial fibrillation on warfarin, CKD IV, meningioma and AAA without rupture admitted for weakness in setting of hypercalcemia.   Generalized weakness 2/2 symptomatic hypercalcemia: Patient presented with bilateral lower extremity weakness due to hypercalcemia. Noted to have recently started calcium supplementation. Patient has been receiving gentle fluid resuscitation and torsemide. Ca 11.6>12.2 this AM. PTH appropriately low. Continues to endorse significant weakness.  - Increase fluids to 125cc/hr, will also change from LR to NS as LR may contain more calcium content - Continue torsemide 40mg  bid  - F/u vitamin D, k/l light chains - Trend BMP   Chest pain:  Acute hypoxic respiratory failure:  Per daughter, patient had episode of back pain radiating to chest overnight, similar to prior night. She notes that it was sudden in onset and abated without any intervention. EKG from  yesterday appears improved from admission without any new ischemic changes noted. Current symptoms unusual for cardiac pain. However, does have new oxygen requirement since yesterday.  - CXR  - Continue to monitor  - Cardiac monitoring   Permanent atrial fibrillation: Chronic systolic heart failure: Patient with history of atrial fibrillation on coumadin and rate controlled with metoprolol. Noted to have super therapeutic INR. Patient's metoprolol decreased  6/20 for concerns of bradycardia. HR 70-90s. Appears euvolemic on exam.  - Warfarin dosing per pharmacy - holding until INR within therapeutic level - Continue metoprolol 12.5mg  daily.   CKD IV: Anemia of chronic disease: Baseline BUN/Cr 48/2.76; Renal function has been stable since admission. sCr 2.64>2.84>2.87. Hemoglobin also appears to be stable ~9, baseline is around 10. No active bleeding noted in setting of super therapeutic INR. - Trend renal function - Strict I&O and daily weights - Avoid nephrotoxic agents  Dysphagia: Patient with history of dysphagia over past year resulting in decreased PO intake. Evaluated by SLP and recommended for dysphagia 2 diet.  - F/u Modified barium swallow test today - F/u SLP recommendations   FEN/GI: Diet: Dysphagia 2 Fluids: NS 125cc/hr Electrolytes: Monitor and replete prn  DVT Prophylaxis: Warfarin Code status: DNR/DNI  Prior to Admission Living Arrangement: Home  Anticipated Discharge Location: SNF Barriers to Discharge: Continued medical management  Dispo: Anticipated discharge in approximately 2-3 day(s).   Andrew Heck, MD  Internal Medicine, PGY-1 03/14/2020, 6:58 AM Pager: [phone] After 5pm on weekdays and 1pm on weekends: On Call pager [phone]

## 2020-03-14 NOTE — NC FL2 (Signed)
[place] LEVEL OF CARE SCREENING TOOL     IDENTIFICATION  Patient Name: Andrew [name] Birthdate: 01-07-1934 Sex: male Admission Date (Current Location): 03/11/2020  [facility] and [place] Number:  Herbalist and Address:  [facility] [place]. [facility], [place] [address], [place], [place] [address]      Provider Number: [id number]  Attending Physician Name and Address:  Lucious Groves, DO  Relative Name and Phone Number:  Kamauri, Denardo (Daughter) [phone] [facility])    Current Level of Care: Hospital Recommended Level of Care: [place] Prior Approval Number: [id number]  Date Approved/Denied:   PASRR Number: [id number]  Discharge Plan: SNF    Current Diagnoses: Patient Active Problem List   Diagnosis Date Noted  . Pressure injury of skin 03/12/2020  . Pressure injury of buttock, stage 2 ([place]) 03/12/2020  . Hypercalcemia 03/11/2020  . Bilateral lower extremity edema 10/27/2019  . Systolic heart failure ([place]) 05/14/2019  . Left hip pain 04/14/2019  . Dysphagia 02/10/2019  . Anemia 07/17/2018  . Gait abnormality 06/20/2018  . Enlarged prostate 05/15/2018  . Abdominal aortic aneurysm (AAA) without rupture ([place]) 05/15/2018  . Sebaceous cyst 05/15/2018  . Neuropathy 02/18/2018  . History of diabetes mellitus 02/03/2018  . 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 Atrial fibrillation ([place]) 04/02/2014  . History of colon cancer 04/02/2014  . Seizure ([place]) 04/02/2014    Orientation RESPIRATION BLADDER Height & Weight     Self, Time, Situation, Place  O2 ([place] 2l) Incontinent Weight: 167 lb 8.8 oz (76 kg) Height:  5\' 6"  (167.6 cm)  BEHAVIORAL SYMPTOMS/MOOD NEUROLOGICAL BOWEL NUTRITION STATUS   (none)  (none) Continent Diet (See discharge summary)  AMBULATORY STATUS COMMUNICATION OF NEEDS Skin   Limited Assist Verbally PU Stage and Appropriate Care (buttocks)                       Personal  Care Assistance Level of Assistance  Bathing, Feeding, Dressing Bathing Assistance: Limited assistance Feeding assistance: Limited assistance Dressing Assistance: Limited assistance     Functional Limitations Info  Sight, Hearing, Speech Sight Info: Adequate Hearing Info: Adequate Speech Info: Adequate    SPECIAL CARE FACTORS FREQUENCY  OT (By licensed OT), PT (By licensed PT)     PT Frequency: 5x/week OT Frequency: 5x/week            Contractures Contractures Info: Not present    Additional Factors Info  Code Status, Allergies Code Status Info: DNR Allergies Info: Tegretol carbamazepine, Depakote divalproex Sodium, Dilantin phenytoin Sodium Extended, Phenobarbital           Current Medications (03/14/2020):  This is the current hospital active medication list Current Facility-Administered Medications  Medication Dose Route Frequency Provider Last Rate Last Admin  . acetaminophen (TYLENOL) tablet 650 mg  650 mg Oral Q6H PRN Bloomfield, Carley D, DO   650 mg at 03/14/20 0920  . atorvastatin (LIPITOR) tablet 20 mg  20 mg Oral QHS Bloomfield, Carley D, DO   20 mg at 03/13/20 2139  . lactated ringers infusion   Intravenous Continuous Harvie Heck, MD 100 mL/hr at 03/14/20 0919 New Bag at 03/14/20 0919  . metoprolol tartrate (LOPRESSOR) tablet 12.5 mg  12.5 mg Oral Daily [name], MD   12.5 mg at 03/14/20 0920  . torsemide (DEMADEX) tablet 40 mg  40 mg Oral BID [name], MD   40 mg at 03/14/20 0919  .  Warfarin - Pharmacist Dosing Inpatient   Does not apply [place] Duanne Limerick, [facility]      . zonisamide (ZONEGRAN) capsule 300 mg  300 mg Oral BID Bloomfield, Carley D, DO   300 mg at 03/13/20 2139     Discharge Medications: Please see discharge summary for a list of discharge medications.  Relevant Imaging Results:  Relevant Lab Results:   Additional Information SSN: [place], LCSW

## 2020-03-14 NOTE — Progress Notes (Signed)
ANTICOAGULATION CONSULT NOTE - Initial Consult  Pharmacy Consult for Warfarin  Indication: atrial fibrillation  Allergies  Allergen Reactions  . Tegretol [Carbamazepine] Other (See Comments)    Made patient's feet swell  . Depakote [Divalproex Sodium] Rash  . Dilantin [Phenytoin Sodium Extended] Rash  . Phenobarbital Rash    Patient Measurements: Height: 5\' 6"  (167.6 cm) Weight: 76 kg (167 lb 8.8 oz) IBW/kg (Calculated) : 63.8  Vital Signs: Temp: 97 F (36.1 C) (06/21 0722) BP: 130/56 (06/21 0722) Pulse Rate: 70 (06/21 0722)  Labs: Recent Labs    03/11/20 1627 03/11/20 1714 03/12/20 0425 03/12/20 0425 03/13/20 0718 03/14/20 0420  HGB 9.6*   < > 9.3*   < > 9.9* 9.3*  HCT 31.5*   < > 31.0*  --  32.1* 30.7*  PLT 147*   < > 135*  --  137* 132*  APTT 81*  --   --   --   --   --   LABPROT 41.1*   < > 41.2*  --  38.5* 33.7*  INR 4.5*   < > 4.5*  --  4.1* 3.5*  CREATININE 2.76*   < > 2.64*  --  2.84* 2.87*   < > = values in this interval not displayed.    Estimated Creatinine Clearance: 17.3 mL/min (A) (by C-G formula based on SCr of 2.87 mg/dL (H)).  Assessment: Patient is a 79 yom that presented to the ED with c/o right arm weakness. Patient has a hx of Afib and is on warfarin PTA for this.  Home warfarin regimen is 6 mg on Tuesday, Thursday, Saturday and 3 mg on all other days.    INR remains elevated at 3.5  Goal of Therapy:  INR 2-3 Monitor platelets by anticoagulation protocol: Yes   Plan:  Hold warfarin this evening Monitor daily INR, CBC, s/s bleeding Barth Kirks, PharmD, BCPS, BCCCP Clinical Pharmacist [phone]  Please check AMION for all [place] numbers  03/14/2020 11:07 AM

## 2020-03-15 DIAGNOSIS — M549 Dorsalgia, unspecified: Secondary | ICD-10-CM

## 2020-03-15 LAB — CBC
HCT: 31 % — ABNORMAL LOW (ref 39.0–52.0)
Hemoglobin: 9.6 g/dL — ABNORMAL LOW (ref 13.0–17.0)
MCH: 30 pg (ref 26.0–34.0)
MCHC: 31 g/dL (ref 30.0–36.0)
MCV: 96.9 fL (ref 80.0–100.0)
Platelets: 132 10*3/uL — ABNORMAL LOW (ref 150–400)
RBC: 3.2 MIL/uL — ABNORMAL LOW (ref 4.22–5.81)
RDW: 13.7 % (ref 11.5–15.5)
WBC: 7.5 10*3/uL (ref 4.0–10.5)
nRBC: 0 % (ref 0.0–0.2)

## 2020-03-15 LAB — RENAL FUNCTION PANEL
Albumin: 3.1 g/dL — ABNORMAL LOW (ref 3.5–5.0)
Anion gap: 9 (ref 5–15)
BUN: 50 mg/dL — ABNORMAL HIGH (ref 8–23)
CO2: 35 mmol/L — ABNORMAL HIGH (ref 22–32)
Calcium: 12 mg/dL — ABNORMAL HIGH (ref 8.9–10.3)
Chloride: 96 mmol/L — ABNORMAL LOW (ref 98–111)
Creatinine, Ser: 2.77 mg/dL — ABNORMAL HIGH (ref 0.61–1.24)
GFR calc Af Amer: 23 mL/min — ABNORMAL LOW (ref >60)
GFR calc non Af Amer: 20 mL/min — ABNORMAL LOW (ref >60)
Glucose, Bld: 114 mg/dL — ABNORMAL HIGH (ref 70–99)
Phosphorus: 4.4 mg/dL (ref 2.5–4.6)
Potassium: 4.3 mmol/L (ref 3.5–5.1)
Sodium: 140 mmol/L (ref 135–145)

## 2020-03-15 LAB — PROTEIN ELECTROPHORESIS, SERUM
A/G Ratio: 1.3 (ref 0.7–1.7)
Albumin ELP: 3.3 g/dL (ref 2.9–4.4)
Alpha-1-Globulin: 0.3 g/dL (ref 0.0–0.4)
Alpha-2-Globulin: 0.6 g/dL (ref 0.4–1.0)
Beta Globulin: 0.7 g/dL (ref 0.7–1.3)
Gamma Globulin: 0.8 g/dL (ref 0.4–1.8)
Globulin, Total: 2.5 g/dL (ref 2.2–3.9)
Total Protein ELP: 5.8 g/dL — ABNORMAL LOW (ref 6.0–8.5)

## 2020-03-15 LAB — PROTIME-INR
INR: 2.8 — ABNORMAL HIGH (ref 0.8–1.2)
Prothrombin Time: 28.4 seconds — ABNORMAL HIGH (ref 11.4–15.2)

## 2020-03-15 LAB — CALCITRIOL (1,25 DI-OH VIT D): Vit D, 1,25-Dihydroxy: 111 pg/mL — ABNORMAL HIGH (ref 19.9–79.3)

## 2020-03-15 MED ORDER — CALCITONIN (SALMON) 200 UNIT/ACT NA SOLN
1.0000 | Freq: Every day | NASAL | Status: AC
  Administered 2020-03-15 – 2020-03-16 (×2): 1 via NASAL
  Filled 2020-03-15: qty 3.7

## 2020-03-15 MED ORDER — LIDOCAINE 5 % EX PTCH
1.0000 | MEDICATED_PATCH | CUTANEOUS | Status: DC
  Administered 2020-03-15 – 2020-03-21 (×6): 1 via TRANSDERMAL
  Filled 2020-03-15 (×7): qty 1

## 2020-03-15 MED ORDER — KETOROLAC TROMETHAMINE 15 MG/ML IJ SOLN
15.0000 mg | INTRAMUSCULAR | Status: DC | PRN
  Filled 2020-03-15: qty 1

## 2020-03-15 MED ORDER — KETOROLAC TROMETHAMINE 30 MG/ML IJ SOLN: 30.0000 mg | INTRAMUSCULAR | Status: DC | PRN

## 2020-03-15 MED ORDER — WARFARIN SODIUM 3 MG PO TABS
3.0000 mg | ORAL_TABLET | Freq: Once | ORAL | Status: AC
  Administered 2020-03-15: 3 mg via ORAL
  Filled 2020-03-15: qty 1

## 2020-03-15 MED ORDER — ENSURE ENLIVE PO LIQD
237.0000 mL | Freq: Three times a day (TID) | ORAL | Status: DC
  Administered 2020-03-15 – 2020-03-16 (×3): 237 mL via ORAL

## 2020-03-15 NOTE — Plan of Care (Signed)
  Problem: Health Behavior/Discharge Planning: Goal: Ability to manage health-related needs will improve Outcome: Progressing   Problem: Clinical Measurements: Goal: Ability to maintain clinical measurements within normal limits will improve Outcome: Progressing Goal: Will remain free from infection Outcome: Progressing Goal: Respiratory complications will improve Outcome: Progressing   Problem: Elimination: Goal: Will not experience complications related to bowel motility Outcome: Progressing Goal: Will not experience complications related to urinary retention Outcome: Progressing   Problem: Safety: Goal: Ability to remain free from injury will improve Outcome: Progressing   Problem: Skin Integrity: Goal: Risk for impaired skin integrity will decrease Outcome: Progressing

## 2020-03-15 NOTE — Progress Notes (Signed)
Physical Therapy Treatment Patient Details Name: Andrew [name] MRN: [id number] DOB: [birth year] Today's Date: 03/15/2020    History of Present Illness 86 y.o. male admitted with weakness on 6/18.  MRI/CT on 03/11/20 findings consistent with a meningioma within the left frontal lobe. Remote lacunar infarcts involving the left basal ganglia and left thalamus. Past medical history of severe bilateral carpal tunnel, dysphagia, AAA without rupture, atrial fibrillation on warfarin, CKD stage III/IV,  and systolic heart failure.    PT Comments    Pt making good progress.  He was able to ambulate 60' x 3 with RW, min A, chair follow, and seated rest breaks.  Pt does need frequent cues for safety and for rest breaks when his legs began to buckle.  Required cues for slow and controled exercises with full ROM.  Cont POC. R ecommend SNF at d/c.    Follow Up Recommendations  SNF     Equipment Recommendations  None recommended by PT    Recommendations for Other Services       Precautions / Restrictions Precautions Precautions: Fall    Mobility  Bed Mobility               General bed mobility comments: in recliner at start of session  Transfers Overall transfer level: Needs assistance Equipment used: Rolling walker (2 wheeled) Transfers: Sit to/from Stand Sit to Stand: Mod assist         General transfer comment: Sit to stand x 4; cues for safe hand placement, getting to edge of chair, and use of momentum; mod A to boost  Ambulation/Gait Ambulation/Gait assistance: Min assist Gait Distance (Feet): 60 Feet (x3) Assistive device: Rolling walker (2 wheeled) Gait Pattern/deviations: Trunk flexed;Step-to pattern;Decreased stance time - left;Wide base of support     General Gait Details: Pt with trunk flexed and walker too far forward -frequent cues to get closer to walker and pt able to correct but still with flexed trunk (dtr reports baseline).  As pt would fatigue he would increase  speed/too much forward momentum and R leg would buckle.  He required cues for rest breaks at this point.  Daughter provided chair follow.   Stairs             Wheelchair Mobility    Modified Rankin (Stroke Patients Only) Modified Rankin (Stroke Patients Only) Pre-Morbid Rankin Score: Moderate disability Modified Rankin: Moderately severe disability     Balance Overall balance assessment: Needs assistance Sitting-balance support: Feet supported Sitting balance-Leahy Scale: Good       Standing balance-Leahy Scale: Poor Standing balance comment: reliant on external support                            Cognition Arousal/Alertness: Awake/alert Behavior During Therapy: WFL for tasks assessed/performed Overall Cognitive Status: Impaired/Different from baseline                         Following Commands: Follows one step commands consistently Safety/Judgement: Decreased awareness of deficits   Problem Solving: Slow processing;Difficulty sequencing;Requires tactile cues;Requires verbal cues        Exercises General Exercises - Lower Extremity Ankle Circles/Pumps: AROM;Both;20 reps;Seated Long Arc Quad: AROM;Both;20 reps;Seated Hip Flexion/Marching: AROM;Both;20 reps;Seated Other Exercises Other Exercises: hip ADD pillow squeeze x 10    General Comments General comments (skin integrity, edema, etc.): VSS on 3 LPM O2      Pertinent Vitals/Pain Pain Assessment: No/denies pain  Home Living                      Prior Function            PT Goals (current goals can now be found in the care plan section) Acute Rehab PT Goals Patient Stated Goal: go to SNF PT Goal Formulation: With patient/family Time For Goal Achievement: 03/27/20 Potential to Achieve Goals: Good Progress towards PT goals: Progressing toward goals    Frequency    Min 3X/week      PT Plan Frequency needs to be updated    Co-evaluation               AM-PAC PT "6 Clicks" Mobility   Outcome Measure  Help needed turning from your back to your side while in a flat bed without using bedrails?: A Little Help needed moving from lying on your back to sitting on the side of a flat bed without using bedrails?: A Little Help needed moving to and from a bed to a chair (including a wheelchair)?: A Lot Help needed standing up from a chair using your arms (e.g., wheelchair or bedside chair)?: A Lot Help needed to walk in hospital room?: A Little Help needed climbing 3-5 steps with a railing? : A Lot 6 Click Score: 15    End of Session Equipment Utilized During Treatment: Gait belt;Other (comment) (chair follow) Activity Tolerance: Patient tolerated treatment well Patient left: in chair;with call bell/phone within reach;with family/visitor present;Other (comment) (daughter removed chair alarm - reports she will be with him and he doesn't get up on his own) Nurse Communication: Mobility status PT Visit Diagnosis: Other abnormalities of gait and mobility (R26.89);History of falling (Z91.81);Muscle weakness (generalized) (M62.81);Unsteadiness on feet (R26.81)     Time: 8295-6213 PT Time Calculation (min) (ACUTE ONLY): 34 min  Charges:  $Gait Training: 8-22 mins $Therapeutic Exercise: 8-22 mins                     Abran Richard, PT Acute Rehab Services Pager [phone] [facility] 03/15/2020, 1:25 PM

## 2020-03-15 NOTE — Progress Notes (Signed)
Initial Nutrition Assessment  DOCUMENTATION CODES:   Severe malnutrition in context of chronic illness  INTERVENTION:  Ensure Enlive po TID, each supplement provides 350 kcal and 20 grams of protein  Magic cup TID with meals, each supplement provides 290 kcal and 9 grams of protein  Provided education on dysphagia 1 diet   NUTRITION DIAGNOSIS:   Severe Malnutrition related to chronic illness (dysphagia) as evidenced by energy intake < or equal to 75% for > or equal to 1 month, severe fat depletion, severe muscle depletion.    GOAL:   Patient will meet greater than or equal to 90% of their needs    MONITOR:   PO intake, Supplement acceptance, Skin, Weight trends, Labs, I & O's  REASON FOR ASSESSMENT:   Consult Diet education  ASSESSMENT:   Pt presenting with weakness and hypercalcemia. PMH significant for bilateral carpal tunnel, dysphagia, AAA without rupture, Afib, CKD stage 3/4, and heart failure.  6/19 diet downgraded to Dysphagia 3 with thin liquids 6/20 diet downgraded to Dysphagia 2 with thin liquids 6/21 s/p MBS, diet downgraded to Dysphagia 1 diet with thin liquids  Pt sleeping at time of RD visit and only briefly woke up during RD exam. Pt's daughter, the primary care taker, at bedside and providing history. Pt's daughter states that for the last 3 months, the pt has had very little po intake -- essentially taking only bites of each meal offered to him. This decrease in intake was attributed to the pt's dysphagia. Discussed methods for increasing the pt's protein and calorie intake and also provided education on a Dysphagia diet.   Per MD, pt's weakness is likely 2/2 hypercalcemia, and the hypercalcemia is likely due to calcium-containing phosphorus binders and Calcitrol the patient was prescribed 2 months ago. The pt is followed by nephrology and cardiology, but is not a candidate for dialysis due to his heart function.   Per H&P, the pt's daughter reported  that the pt had been eating normally PTA but states he lost 10 lbs in 2 weeks. Per wt readings, pt noted to have had a 4% wt loss x1 month, which is not significant for time frame.   PO Intake: 20-25% x 2 recorded meals   UOP: 3,953ml x24 hours I/O: -1,629.24ml since admit  Labs: Phosphorus 4.7 (H), Corrected Calcium 13 (H) Medications: Warfarin  NUTRITION - FOCUSED PHYSICAL EXAM:    Most Recent Value  Orbital Region Severe depletion  Upper Arm Region Severe depletion  Thoracic and Lumbar Region Severe depletion  Buccal Region Severe depletion  Temple Region Moderate depletion  Clavicle Bone Region Severe depletion  Clavicle and Acromion Bone Region Severe depletion  Scapular Bone Region Severe depletion  Dorsal Hand Moderate depletion  Patellar Region Moderate depletion  Anterior Thigh Region Severe depletion  Posterior Calf Region Moderate depletion  Edema (RD Assessment) None  Hair Reviewed  Eyes Reviewed  Mouth Reviewed  Skin Other (Comment)  [dry, cracked skin on BLE]  Nails Reviewed       Diet Order:   Diet Order            DIET - DYS 1 Room service appropriate? Yes; Fluid consistency: Thin  Diet effective now                 EDUCATION NEEDS:   Not appropriate for education at this time  Skin:  Skin Assessment: Skin Integrity Issues: Skin Integrity Issues:: Stage II Stage II: buttocks  Last BM:  unknown  Height:  Ht Readings from Last 1 Encounters:  03/11/20 5\' 6"  (1.676 m)    Weight:   Wt Readings from Last 1 Encounters:  03/15/20 72 kg    BMI:  Body mass index is 25.62 kg/m.  Estimated Nutritional Needs:   Kcal:  1900-2100  Protein:  95-105 grams  Fluid:  >1.9L/d    Larkin Ina, MS, RD, LDN RD pager number and weekend/on-call pager number located in [place].

## 2020-03-15 NOTE — Progress Notes (Signed)
Subjective: HD 4 No acute overnight events reported.  [name] was evaluated at bedside this morning. He is sitting up in chair. Daughter, Andrew [name], at bedside. Patient notes he had a terrible night due to ongoing back and chest pain. He notes feeling more weak this morning. Patient's daughter notes that he can't feed himself due to weakness. Also notes left shoulder/arm weakness and pain.  Discussed barium swallow study results and osteophyte findings likely causing his dysphagia and recommendation for soft food diet.    Objective:  Vital signs in last 24 hours: Vitals:   03/14/20 0722 03/14/20 1605 03/14/20 2310 03/15/20 0340  BP: (!) 130/56 110/68 (!) 139/55   Pulse: 70 (!) 57 81   Resp: 18 18 20    Temp: (!) 97 F (36.1 C) 97.6 F (36.4 C) 98.1 F (36.7 C)   TempSrc:      SpO2: 100% 100% 98%   Weight:    72 kg  Height:       CBC Latest Ref Rng & Units 03/15/2020 03/14/2020 03/13/2020  WBC 4.0 - 10.5 K/uL 7.5 8.7 8.9  Hemoglobin 13.0 - 17.0 g/dL 9.6(L) 9.3(L) 9.9(L)  Hematocrit 39 - 52 % 31.0(L) 30.7(L) 32.1(L)  Platelets 150 - 400 K/uL 132(L) 132(L) 137(L)   BMP Latest Ref Rng & Units 03/15/2020 03/14/2020 03/13/2020  Glucose 70 - 99 mg/dL 114(H) 154(H) 217(H)  BUN 8 - 23 mg/dL 50(H) 55(H) 52(H)  Creatinine 0.61 - 1.24 mg/dL 2.77(H) 2.87(H) 2.84(H)  BUN/Creat Ratio 10 - 24 - - -  Sodium 135 - 145 mmol/L 140 139 139  Potassium 3.5 - 5.1 mmol/L 4.3 4.5 4.1  Chloride 98 - 111 mmol/L 96(L) 96(L) 98  CO2 22 - 32 mmol/L 35(H) 35(H) 32  Calcium 8.9 - 10.3 mg/dL 12.0(H) 12.2(H) 11.6(H)   Physical Exam  Constitutional: Chronically ill appearing elderly male, no acute distress  Head: Normocephalic and atraumatic. Edentulous, MMM Cardiovascular: Irregularly irregular rhythm, S1 and S2 present, no murmurs/rubs/gallops  Respiratory: Effort normal and breath sounds normal. No respiratory distress. No wheezes or rales noted.  GI: Soft. Bowel sounds are normal. No distension.  There is no tenderness.  Extremities: ROM intake, distal pulses present, 1+ pitting edema to bilateral lower extremities Neurological: Is alert and oriented x3, no apparent focal deficits noted.   Skin: Not diaphoretic. No erythema.   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 is an 86 yr old with PMHx of systolic heart failure (EF 30-35%), atrial fibrillation on warfarin, CKD IV, meningioma and AAA without rupture admitted for weakness in setting of hypercalcemia.   Generalized weakness 2/2 symptomatic hypercalcemia: Patient presented with bilateral lower extremity weakness due to hypercalcemia. Noted to have recently started calcium supplementation. Patient has been receiving gentle fluid resuscitation and torsemide. Ca 11.6>12.2>12.0 this AM. PTH appropriately low.  Kappa/lamba light chain ratio not consistent with MM. Suspect hypercalcemia to be secondary to calcitriol supplement preadmission (vitamin D 1,25 still pending). Continues to endorse significant weakness.  - NS 100cc/hr - Continue torsemide 40mg  bid  - F/u vitamin D levels  - Calcitonin nasal spray 1 spray/nostril daily - Trend renal function panel   Back pain radiating to chest:  Patient continues to endorse  back pain radiating to his chest that is worse at night. EKG and CXR without any acute findings. His symptoms seem chronic and he does note improvement with tylenol.  - Lidocaine patch - Acetaminophen 650mg  q6h prn - Cardiac monitoring  Permanent atrial fibrillation: Chronic systolic heart failure: Patient with history of atrial fibrillation on coumadin and rate controlled with metoprolol. Noted to have super therapeutic INR. Patient's metoprolol decreased 6/20  for concerns of bradycardia. HR 70-90s. Appears slightly hypervolemic with 1+ BLE pitting edema.  - Warfarin dosing per pharmacy - holding until INR within therapeutic level - Decrease IV fluids to 100 cc/hr, continue torsemide as above - Continue metoprolol 12.5mg  daily.   CKD IV: Anemia of chronic disease: Baseline BUN/Cr 48/2.76; Renal function has been stable since admission. sCr 2.64>2.84>2.87>2.77. Hemoglobin also appears to be stable ~9, baseline is around 10. No active bleeding noted in setting of super therapeutic INR. - Trend renal function - Strict I&O and daily weights - Avoid nephrotoxic agents  Dysphagia: Patient with history of dysphagia over past year resulting in decreased PO intake. Evaluated by SLP and recommended for dysphagia 1 diet due to solid food dysphagia.   FEN/GI: Diet: Dysphagia 1 Fluids: NS 100cc/hr Electrolytes: Monitor and replete prn  DVT Prophylaxis: Warfarin Code status: DNR/DNI  Prior to Admission Living Arrangement: Home  Anticipated Discharge Location: SNF Barriers to Discharge: Continued medical management  Dispo: Anticipated discharge in approximately 2-3 day(s).   Harvie Heck, MD  Internal Medicine, PGY-1 03/15/2020, 6:53 AM Pager: [phone] After 5pm on weekdays and 1pm on weekends: On Call pager [phone]

## 2020-03-15 NOTE — Progress Notes (Signed)
  Speech Language Pathology Treatment: Dysphagia  Patient Details Name: Andrew [name] MRN: [id number] DOB: [birth year] Today's Date: 03/15/2020 Time: 1995-7900 SLP Time Calculation (min) (ACUTE ONLY): 24 min  Assessment / Plan / Recommendation Clinical Impression  Pt seen with daughter at bedside. Extensive education provided regarding result of MBS, reasons for pts dysphagia and diet texture recommendation. Overall, pt has low risk of aspiration, but high risk of poor nutrition, given solids food dysphagia. Pt has been very sad about the food he has gotten since being on a pureed diet. However, encouraged daughter that pt can still have foods of choice, he just needs to get better nutrition from liquids and puree sthat are easy to swallow. If he wants to still try his favorite solid foods, that is ok. Provided a list of appropriate texture to daughter. Education complete, no SLP f/u needed at this time, will sign off.   HPI        SLP Plan  All goals met       Recommendations  Diet recommendations: Thin liquid;Dysphagia 1 (puree) Liquids provided via: Cup;Straw Medication Administration: Crushed with puree Supervision: Full supervision/cueing for compensatory strategies;Trained caregiver to feed patient Compensations: Minimize environmental distractions;Slow rate;Small sips/bites;Follow solids with liquid Postural Changes and/or Swallow Maneuvers: Seated upright 90 degrees;Upright 30-60 min after meal                Follow up Recommendations: 24 hour supervision/assistance;Skilled Nursing facility SLP Visit Diagnosis: Dysphagia, unspecified (R13.10) Plan: All goals met       GO                [place]sor Zirkelbach, Katherene Ponto 03/15/2020, 11:32 AM

## 2020-03-15 NOTE — Progress Notes (Signed)
ANTICOAGULATION CONSULT NOTE - Initial Consult  Pharmacy Consult for Warfarin  Indication: atrial fibrillation  Allergies  Allergen Reactions  . Tegretol [Carbamazepine] Other (See Comments)    Made patient's feet swell  . Depakote [Divalproex Sodium] Rash  . Dilantin [Phenytoin Sodium Extended] Rash  . Phenobarbital Rash    Patient Measurements: Height: 5\' 6"  (167.6 cm) Weight: 72 kg (158 lb 11.7 oz) IBW/kg (Calculated) : 63.8  Vital Signs: Temp: 98.1 F (36.7 C) (06/21 2310) BP: 118/55 (06/22 0819) Pulse Rate: 89 (06/22 0819)  Labs: Recent Labs    03/13/20 0718 03/13/20 0718 03/14/20 0420 03/15/20 0401  HGB 9.9*   < > 9.3* 9.6*  HCT 32.1*  --  30.7* 31.0*  PLT 137*  --  132* 132*  LABPROT 38.5*  --  33.7* 28.4*  INR 4.1*  --  3.5* 2.8*  CREATININE 2.84*  --  2.87* 2.77*   < > = values in this interval not displayed.    Estimated Creatinine Clearance: 17.9 mL/min (A) (by C-G formula based on SCr of 2.77 mg/dL (H)).  Assessment: Patient is a 4 yom that presented to the ED with c/o right arm weakness. Patient has a hx of Afib and is on warfarin PTA for this.  Home warfarin regimen is 6 mg on Tuesday, Thursday, Saturday and 3 mg on all other days.    INR now within goal range at 2.8  Goal of Therapy:  INR 2-3 Monitor platelets by anticoagulation protocol: Yes   Plan:  Warfarin 3 mg x 1 Daily INR  Barth Kirks, PharmD, BCPS, BCCCP Clinical Pharmacist [phone]  Please check AMION for all [place] numbers  03/15/2020 10:27 AM

## 2020-03-15 NOTE — Plan of Care (Signed)

## 2020-03-16 DIAGNOSIS — R1310 Dysphagia, unspecified: Secondary | ICD-10-CM

## 2020-03-16 DIAGNOSIS — E43 Unspecified severe protein-calorie malnutrition: Secondary | ICD-10-CM | POA: Insufficient documentation

## 2020-03-16 DIAGNOSIS — I5022 Chronic systolic (congestive) heart failure: Secondary | ICD-10-CM

## 2020-03-16 DIAGNOSIS — I4821 Permanent atrial fibrillation: Secondary | ICD-10-CM

## 2020-03-16 DIAGNOSIS — M545 Low back pain: Secondary | ICD-10-CM

## 2020-03-16 LAB — RENAL FUNCTION PANEL
Albumin: 3 g/dL — ABNORMAL LOW (ref 3.5–5.0)
Anion gap: 12 (ref 5–15)
BUN: 47 mg/dL — ABNORMAL HIGH (ref 8–23)
CO2: 31 mmol/L (ref 22–32)
Calcium: 12.4 mg/dL — ABNORMAL HIGH (ref 8.9–10.3)
Chloride: 94 mmol/L — ABNORMAL LOW (ref 98–111)
Creatinine, Ser: 2.66 mg/dL — ABNORMAL HIGH (ref 0.61–1.24)
GFR calc Af Amer: 24 mL/min — ABNORMAL LOW (ref >60)
GFR calc non Af Amer: 21 mL/min — ABNORMAL LOW (ref >60)
Glucose, Bld: 126 mg/dL — ABNORMAL HIGH (ref 70–99)
Phosphorus: 5 mg/dL — ABNORMAL HIGH (ref 2.5–4.6)
Potassium: 4.1 mmol/L (ref 3.5–5.1)
Sodium: 137 mmol/L (ref 135–145)

## 2020-03-16 LAB — CBC
HCT: 30 % — ABNORMAL LOW (ref 39.0–52.0)
Hemoglobin: 9.1 g/dL — ABNORMAL LOW (ref 13.0–17.0)
MCH: 29.2 pg (ref 26.0–34.0)
MCHC: 30.3 g/dL (ref 30.0–36.0)
MCV: 96.2 fL (ref 80.0–100.0)
Platelets: 130 10*3/uL — ABNORMAL LOW (ref 150–400)
RBC: 3.12 MIL/uL — ABNORMAL LOW (ref 4.22–5.81)
RDW: 13.5 % (ref 11.5–15.5)
WBC: 8.5 10*3/uL (ref 4.0–10.5)
nRBC: 0 % (ref 0.0–0.2)

## 2020-03-16 LAB — PROTIME-INR
INR: 2.3 — ABNORMAL HIGH (ref 0.8–1.2)
Prothrombin Time: 24.7 seconds — ABNORMAL HIGH (ref 11.4–15.2)

## 2020-03-16 LAB — VITAMIN D 25 HYDROXY (VIT D DEFICIENCY, FRACTURES): Vit D, 25-Hydroxy: 76.09 ng/mL (ref 30–100)

## 2020-03-16 MED ORDER — ENSURE ENLIVE PO LIQD
237.0000 mL | ORAL | Status: DC
  Administered 2020-03-17 – 2020-03-20 (×5): 237 mL via ORAL

## 2020-03-16 MED ORDER — WARFARIN SODIUM 3 MG PO TABS
6.0000 mg | ORAL_TABLET | Freq: Once | ORAL | Status: AC
  Administered 2020-03-16: 6 mg via ORAL
  Filled 2020-03-16: qty 2

## 2020-03-16 MED ORDER — BOOST / RESOURCE BREEZE PO LIQD CUSTOM
1.0000 | Freq: Two times a day (BID) | ORAL | Status: DC
  Administered 2020-03-16 – 2020-03-21 (×9): 1 via ORAL

## 2020-03-16 NOTE — Social Work (Signed)
CSW started insurance auth with [facility] reference [id number].   Emeterio Reeve, Latanya Presser, [place] Social Worker [phone]

## 2020-03-16 NOTE — Progress Notes (Signed)
Brief Nutrition Note  Spoke with MD regarding patient. Pt is currently consuming Ensure TID. Initially, it was thought that the pt would not consume the Ensure in its entirety given his 3 month history of poor intake, but per MD, pt is drinking nearly 100%. RD will transition pt to Ensure Enlive once daily and Boost Breeze BID to decrease calcium consumption.   Larkin Ina, MS, RD, LDN RD pager number and weekend/on-call pager number located in [place].

## 2020-03-16 NOTE — Progress Notes (Signed)
ANTICOAGULATION CONSULT NOTE - Initial Consult  Pharmacy Consult for Warfarin  Indication: atrial fibrillation  Allergies  Allergen Reactions  . Tegretol [Carbamazepine] Other (See Comments)    Made patient's feet swell  . Depakote [Divalproex Sodium] Rash  . Dilantin [Phenytoin Sodium Extended] Rash  . Phenobarbital Rash    Patient Measurements: Height: 5\' 6"  (167.6 cm) Weight: 72 kg (158 lb 11.7 oz) IBW/kg (Calculated) : 63.8  Vital Signs: Temp: 97.5 F (36.4 C) (06/23 0818) Temp Source: Oral (06/23 0818) BP: 128/69 (06/23 0818) Pulse Rate: 88 (06/23 0818)  Labs: Recent Labs    03/14/20 0420 03/14/20 0420 03/15/20 0401 03/16/20 0303  HGB 9.3*   < > 9.6* 9.1*  HCT 30.7*  --  31.0* 30.0*  PLT 132*  --  132* 130*  LABPROT 33.7*  --  28.4* 24.7*  INR 3.5*  --  2.8* 2.3*  CREATININE 2.87*  --  2.77* 2.66*   < > = values in this interval not displayed.    Estimated Creatinine Clearance: 18.7 mL/min (A) (by C-G formula based on SCr of 2.66 mg/dL (H)).  Assessment: Patient is a 18 yom that presented to the ED with c/o right arm weakness. Patient has a hx of Afib and is on warfarin PTA for this.  Home warfarin regimen is 6 mg on Tuesday, Thursday, Saturday and 3 mg on all other days.    INR 2.3 within goal today  Goal of Therapy:  INR 2-3 Monitor platelets by anticoagulation protocol: Yes   Plan:  Warfarin 6 mg x 1 Daily INR  Barth Kirks, PharmD, BCPS, BCCCP Clinical Pharmacist [phone]  Please check AMION for all [place] numbers  03/16/2020 10:44 AM

## 2020-03-16 NOTE — Progress Notes (Signed)
Subjective: HD 5 No acute overnight events reported.  [name] was evaluated at bedside this morning. He is sitting up in chair. Daughter, Andrew [name], at bedside. He mentions some complaint of L shoulder pain. Taking tylenol without significant improvement. Had prolonged dscussion with daughter regarding plan to continue fluids with diuresis and continuing to monitor calcium. Also discussed his dysphagia and increasing supervision with his meals if he wishes to eat non-soft foods as his daughter mentions loss of appetite presumably due to his current ordered diet.   Objective:  Vital signs in last 24 hours: Vitals:   03/15/20 0340 03/15/20 0819 03/15/20 1556 03/15/20 2232  BP:  (!) 118/55 124/66 138/74  Pulse:  89 67 78  Resp:  18 19 18   Temp:   97.7 F (36.5 C) 97.8 F (36.6 C)  TempSrc:   Oral Oral  SpO2:  100% 97% 100%  Weight: 72 kg     Height:       CBC Latest Ref Rng & Units 03/16/2020 03/15/2020 03/14/2020  WBC 4.0 - 10.5 K/uL 8.5 7.5 8.7  Hemoglobin 13.0 - 17.0 g/dL 9.1(L) 9.6(L) 9.3(L)  Hematocrit 39 - 52 % 30.0(L) 31.0(L) 30.7(L)  Platelets 150 - 400 K/uL 130(L) 132(L) 132(L)   BMP Latest Ref Rng & Units 03/16/2020 03/15/2020 03/14/2020  Glucose 70 - 99 mg/dL 126(H) 114(H) 154(H)  BUN 8 - 23 mg/dL 47(H) 50(H) 55(H)  Creatinine 0.61 - 1.24 mg/dL 2.66(H) 2.77(H) 2.87(H)  BUN/Creat Ratio 10 - 24 - - -  Sodium 135 - 145 mmol/L 137 140 139  Potassium 3.5 - 5.1 mmol/L 4.1 4.3 4.5  Chloride 98 - 111 mmol/L 94(L) 96(L) 96(L)  CO2 22 - 32 mmol/L 31 35(H) 35(H)  Calcium 8.9 - 10.3 mg/dL 12.4(H) 12.0(H) 12.2(H)   Physical Exam  Constitutional: Chronically ill appearing elderly male, no acute distress  Head: Normocephalic and atraumatic. Edentulous, MMM Cardiovascular: Irregularly irregular rhythm, S1 and S2 present, no murmurs/rubs/gallops  Respiratory: Effort normal and breath sounds normal. No respiratory distress. No wheezes or rales noted.  GI: Soft. Bowel sounds  are normal. No distension. There is no tenderness.  Extremities: ROM intake, distal pulses present, 2+ pitting edema to bilateral lower extremities Neurological: Is alert and oriented x3, no apparent focal deficits noted.   Skin: Not diaphoretic. No erythema.   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 is an 86 yr old with PMHx of systolic heart failure (EF 30-35%), atrial fibrillation on warfarin, CKD IV, meningioma and AAA without rupture admitted for weakness in setting of hypercalcemia.   Generalized weakness 2/2 symptomatic hypercalcemia: Patient presented with bilateral lower extremity weakness due to hypercalcemia. Chronic vitamin D therapy with recent initiation of calcium supplementation and calcitriol. Elevated calcium and vitamin 1,25-OH D levels. Patient has been receiving gentle fluid resuscitation and torsemide. Ca 12.2>12.0>12.4 this AM. PTH appropriately low.  Kappa/lamba light chain ratio not consistent with MM. Continues to endorse significant weakness.  - NS 75cc/hr - Continue torsemide 40mg  bid  - Will check for vitamin D 25-OH levels  - Calcitonin nasal spray 1 spray/nostril daily x 48hrs  - Trend renal function panel   Back pain radiating to chest:  Patient continues to endorse back pain radiating to his chest. His symptoms seem chronic and he does note some improvement with tylenol. Likely that his hypercalcemia is contributing to chronic osteoarthritic pains.  - Lidocaine patch - Acetaminophen 650mg  q6h prn - Cardiac monitoring  Permanent atrial fibrillation: Chronic systolic heart failure: Patient with history of atrial fibrillation on coumadin and rate controlled with  metoprolol. Patient's metoprolol decreased 6/20 for concerns of bradycardia. HR 70-90s. Appears slightly hypervolemic with 2+ BLE pitting edema.  - Warfarin dosing per pharmacy - Decrease IV fluids to 75 cc/hr, continue torsemide as above - Continue metoprolol 12.5mg  daily.   CKD IV: Anemia of chronic disease: Baseline BUN/Cr 48/2.76; Renal function has been stable since admission. sCr 2.87>2.77>2.66. Hemoglobin also appears to be stable ~9, baseline is around 10. No active bleeding noted in setting of super therapeutic INR. - Trend renal function - Strict I&O and daily weights - Avoid nephrotoxic agents  Dysphagia: Patient with history of dysphagia over past year resulting in decreased PO intake. Evaluated by SLP and recommended for dysphagia 1 diet due to solid food dysphagia. SLP recommend that patient can have solid foods of choice   - Dysphagia 1 diet - Advance to solid foods as tolerated   FEN/GI: Diet: Dysphagia 1 Fluids: NS 75cc/hr Electrolytes: Monitor and replete prn  DVT Prophylaxis: Warfarin Code status: DNR/DNI  Prior to Admission Living Arrangement: Home  Anticipated Discharge Location: SNF Barriers to Discharge: Continued medical management  Dispo: Anticipated discharge in approximately 2-3 day(s).   Andrew Heck, MD  Internal Medicine, PGY-1 03/16/2020, 6:53 AM Pager: [phone] After 5pm on weekdays and 1pm on weekends: On Call pager [phone]

## 2020-03-17 ENCOUNTER — Inpatient Hospital Stay (HOSPITAL_COMMUNITY): Payer: No Typology Code available for payment source

## 2020-03-17 DIAGNOSIS — E43 Unspecified severe protein-calorie malnutrition: Secondary | ICD-10-CM

## 2020-03-17 LAB — RENAL FUNCTION PANEL
Albumin: 3 g/dL — ABNORMAL LOW (ref 3.5–5.0)
Anion gap: 7 (ref 5–15)
BUN: 50 mg/dL — ABNORMAL HIGH (ref 8–23)
CO2: 33 mmol/L — ABNORMAL HIGH (ref 22–32)
Calcium: 12.3 mg/dL — ABNORMAL HIGH (ref 8.9–10.3)
Chloride: 96 mmol/L — ABNORMAL LOW (ref 98–111)
Creatinine, Ser: 2.69 mg/dL — ABNORMAL HIGH (ref 0.61–1.24)
GFR calc Af Amer: 24 mL/min — ABNORMAL LOW (ref >60)
GFR calc non Af Amer: 21 mL/min — ABNORMAL LOW (ref >60)
Glucose, Bld: 107 mg/dL — ABNORMAL HIGH (ref 70–99)
Phosphorus: 5.2 mg/dL — ABNORMAL HIGH (ref 2.5–4.6)
Potassium: 4.1 mmol/L (ref 3.5–5.1)
Sodium: 136 mmol/L (ref 135–145)

## 2020-03-17 LAB — CBC
HCT: 28.7 % — ABNORMAL LOW (ref 39.0–52.0)
Hemoglobin: 8.7 g/dL — ABNORMAL LOW (ref 13.0–17.0)
MCH: 29.3 pg (ref 26.0–34.0)
MCHC: 30.3 g/dL (ref 30.0–36.0)
MCV: 96.6 fL (ref 80.0–100.0)
Platelets: 131 10*3/uL — ABNORMAL LOW (ref 150–400)
RBC: 2.97 MIL/uL — ABNORMAL LOW (ref 4.22–5.81)
RDW: 13.5 % (ref 11.5–15.5)
WBC: 7.7 10*3/uL (ref 4.0–10.5)
nRBC: 0 % (ref 0.0–0.2)

## 2020-03-17 LAB — PROTIME-INR
INR: 2.3 — ABNORMAL HIGH (ref 0.8–1.2)
Prothrombin Time: 24.7 seconds — ABNORMAL HIGH (ref 11.4–15.2)

## 2020-03-17 LAB — TSH: TSH: 5.826 u[IU]/mL — ABNORMAL HIGH (ref 0.350–4.500)

## 2020-03-17 MED ORDER — CALCITONIN (SALMON) 200 UNIT/ML IJ SOLN
4.0000 [IU]/kg | Freq: Two times a day (BID) | INTRAMUSCULAR | Status: AC
  Administered 2020-03-17 – 2020-03-18 (×4): 288 [IU] via SUBCUTANEOUS
  Filled 2020-03-17 (×5): qty 1.44

## 2020-03-17 MED ORDER — HYDROMORPHONE HCL 2 MG PO TABS
1.0000 mg | ORAL_TABLET | ORAL | Status: DC | PRN
  Administered 2020-03-17 – 2020-03-18 (×2): 1 mg via ORAL
  Filled 2020-03-17 (×2): qty 1

## 2020-03-17 MED ORDER — WARFARIN SODIUM 3 MG PO TABS
6.0000 mg | ORAL_TABLET | Freq: Once | ORAL | Status: AC
  Administered 2020-03-17: 6 mg via ORAL
  Filled 2020-03-17: qty 2

## 2020-03-17 NOTE — Progress Notes (Signed)
Subjective: HD 6 No acute overnight events reported.  [name] was evaluated at bedside this morning. He is sitting up in chair. Daughter, Andrew [name], at bedside.  Patient endorses ongoing sacral pain as a result of which he isn't getting much rest in the hospital. Also expresses distaste of current diet. Daughter notes that patient is more interactive this morning and was able to feed himself. Requesting to advance to soft food diet. Had prolonged discussion with daughter regarding possible etiology of his hypercalcemia and discussed that it is still likely due to supplementation and due to his renal disease, is taking longer to clear.  Objective:  Vital signs in last 24 hours: Vitals:   03/15/20 2232 03/16/20 0818 03/16/20 1730 03/16/20 2320  BP: 138/74 128/69 (!) 110/49 (!) 114/55  Pulse: 78 88 65 81  Resp: 18 17 16 19   Temp: 97.8 F (36.6 C) (!) 97.5 F (36.4 C) 97.8 F (36.6 C) 98.1 F (36.7 C)  TempSrc: Oral Oral  Oral  SpO2: 100% 100% 100% 100%  Weight:      Height:       CBC Latest Ref Rng & Units 03/17/2020 03/16/2020 03/15/2020  WBC 4.0 - 10.5 K/uL 7.7 8.5 7.5  Hemoglobin 13.0 - 17.0 g/dL 8.7(L) 9.1(L) 9.6(L)  Hematocrit 39 - 52 % 28.7(L) 30.0(L) 31.0(L)  Platelets 150 - 400 K/uL 131(L) 130(L) 132(L)   BMP Latest Ref Rng & Units 03/17/2020 03/16/2020 03/15/2020  Glucose 70 - 99 mg/dL 107(H) 126(H) 114(H)  BUN 8 - 23 mg/dL 50(H) 47(H) 50(H)  Creatinine 0.61 - 1.24 mg/dL 2.69(H) 2.66(H) 2.77(H)  BUN/Creat Ratio 10 - 24 - - -  Sodium 135 - 145 mmol/L 136 137 140  Potassium 3.5 - 5.1 mmol/L 4.1 4.1 4.3  Chloride 98 - 111 mmol/L 96(L) 94(L) 96(L)  CO2 22 - 32 mmol/L 33(H) 31 35(H)  Calcium 8.9 - 10.3 mg/dL 12.3(H) 12.4(H) 12.0(H)   Physical Exam  Constitutional: Chronically ill appearing elderly male, no acute distress  Head: Normocephalic and atraumatic. Edentulous, MMM Cardiovascular: Irregularly irregular rhythm, S1 and S2 present, no murmurs/rubs/gallops    Respiratory: Effort normal and breath sounds normal. No respiratory distress. No wheezes or rales noted.  GI: Soft. Bowel sounds are normal. No distension. There is no tenderness.  Extremities: ROM intake, distal pulses present, 2+ pitting edema to bilateral lower extremities Neurological: Is alert and oriented x3, no apparent focal deficits noted.   Skin: Not diaphoretic. No erythema.   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 is an 86 yr old with PMHx of systolic heart failure (EF 30-35%), atrial fibrillation on warfarin, CKD IV, meningioma and AAA without rupture admitted for weakness in setting of hypercalcemia.   Generalized weakness 2/2 symptomatic hypercalcemia in setting of hypervitaminosis D vs malnutrition: Patient presented with generalized weakness. Chronic vitamin D therapy with recent initiation of calcium supplementation and calcitriol. Elevated calcium and vitamin 1,25-OH D levels. PTH appropriately low.  Kappa/lamba light chain ratio not consistent with MM. Vitamin D 25-OH wnl. Patient has been receiving gentle fluid resuscitation and torsemide. Ca 12.0>12.4>12.3.  Patient has also received calcitonin spray x48 hours without any significant improvement in calcium levels. Vitamin  D 1,25-OH with short half life but with persistent hypercalcemia, will check PTHrP levels.  - Nephrology consulted, appreciate their recommendations - NS 100 cc/hr - Continue torsemide 40mg  bid, consider decreasing dose if HCO3 remains elevated - Nutrition supplementation with Ensure once daily and Boost bid - Trend renal function panel   Chronic back pain:  Chronic back pain in setting of severe  osteoarthritis. He notes ongoing sacral pain.  Likely that his hypercalcemia is contributing to chronic osteoarthritic pains.  - Lidocaine patch - Acetaminophen 650mg  q6h prn - Cardiac monitoring  Permanent atrial fibrillation: Chronic systolic heart failure: Patient with history of atrial fibrillation on coumadin and rate controlled with metoprolol. Patient's metoprolol decreased 6/20 for concerns of bradycardia. HR 70-90s. Appears slightly hypervolemic with 1-2+ BLE pitting edema.  - Warfarin dosing per pharmacy, INR currently therapeutic - NS 100 cc/hr, continue torsemide as above - Continue metoprolol 12.5mg  daily.   CKD IV: Anemia of chronic disease: Baseline BUN/Cr 48/2.76; Renal function has been stable since admission. sCr 2.77>2.66>2.69. Hemoglobin also appears to be stable ~9, baseline is around 10.  - Trend renal function - Strict I&O and daily weights - Avoid nephrotoxic agents  Dysphagia: Patient with history of dysphagia over past year resulting in decreased PO intake. Evaluated by SLP and recommended for dysphagia 1 diet due to solid food dysphagia. SLP recommend that patient can have solid foods of choice   FEN/GI: Diet: Soft food, per family request  Fluids: NS 100 cc/hr Electrolytes: Monitor and replete prn  DVT Prophylaxis: Warfarin Code status: DNR/DNI  Prior to Admission Living Arrangement: Home  Anticipated Discharge Location: SNF Barriers to Discharge: Continued medical management  Dispo: Anticipated discharge in approximately 2-3 day(s).   Andrew Heck, MD  Internal Medicine, PGY-1 03/17/2020, 6:48 AM Pager: [phone] After 5pm on weekdays and 1pm on weekends: On Call pager [phone]

## 2020-03-17 NOTE — Consult Note (Signed)
[facility]  HISTORY AND PHYSICAL  Andrew [name] is an 86 y.o. male.    Chief Complaint:  Weakness  HPI: Pt is an 61M with a PMH sig for HTN, HLD, Afib, CKD IV, DM, h/o CHF (EF 30-35), and seizures who is now seen at the request of Dr. Heber  for eval and recs Re: hypercalcemia.    Pt was admitted 6/18 for weakness and found to be hypercalcemic.  Initially thought to have a stroke but workup negative.  CKD at baseline.  Ca 12.3 on admission --> 11.6 6/20--> up to 12.3 today 6/24 despite IVFs, torsemide, and 2 doses of intranasal calcitonin.  PTH 8, 1, 25 hydroxy 111, 25 OH Vit D 76.  Takes phoslo as a Ca binder at home (667 mg BID), also takes OTC Vit D (cholecalciferol) 1000 u daily.  SPEP negative without M-spike, normal K/L light chain ratio.  PTHrP pending.  TSH last checked in August 2020.    Pt at bedside with his dtr today.  Reports chronic tailbone pain.  Has been told that he has significant arthritis in his back.  H/o remote of prostate cancer.  Had C-scope last in 2015, CXR this admission with no masses/ prominent adenopathy.   PMH: Past Medical History:  Diagnosis Date  . A-fib ([place])   . Abdominal aortic aneurysm (AAA) without rupture ([place]) 05/15/2018   Incidental finding on CT in 2012 Stable on ultrasound 2020  . Anemia 07/17/2018   He has a remote history of colon cancer with a colectomy at the [place].  No records available He had colonoscopy in 2015 with a 7-year follow-up recommended   . Atrial fibrillation ([place]) 04/02/2014  . Cancer ([place])   . Chronic kidney disease, stage 3, mod decreased GFR (HCC) 04/03/2014  . Diabetes mellitus without complication ([place])   . Dysphagia 02/10/2019  . Dysrhythmia   . Enlarged prostate 05/15/2018  . Gait abnormality 06/20/2018  . GI bleed 04/02/2014  . History of colon cancer 04/02/2014   Colonoscopy Dr. Deatra Ina July 2015 = 3 mm sessile polyp in the sigmoid colon.  Pathology showed tubular adenoma without high-grade dysplasia.   Surveillance colonoscopy 7 to 10 years.  . History of diabetes mellitus 02/03/2018  . Hypertension   . Left hip pain 04/14/2019  . Neuropathy 02/18/2018  . Sebaceous cyst 05/15/2018  . Seizure ([place]) 04/02/2014  . Seizures ([place])   . Systolic heart failure ([place]) 05/14/2019   Diagnosed August 2020 No ischemic work-up   PSH: Past Surgical History:  Procedure Laterality Date  . APPENDECTOMY    . [place]  . COLON SURGERY    . COLONOSCOPY N/A 04/05/2014   Procedure: COLONOSCOPY;  Surgeon: Inda Castle, MD;  Location: [facility];  Service: Endoscopy;  Laterality: N/A;    Past Medical History:  Diagnosis Date  . A-fib ([place])   . Abdominal aortic aneurysm (AAA) without rupture ([place]) 05/15/2018   Incidental finding on CT in 2012 Stable on ultrasound 2020  . Anemia 07/17/2018   He has a remote history of colon cancer with a colectomy at the [place].  No records available He had colonoscopy in 2015 with a 7-year follow-up recommended   . Atrial fibrillation ([place]) 04/02/2014  . Cancer ([place])   . Chronic kidney disease, stage 3, mod decreased GFR (HCC) 04/03/2014  . Diabetes mellitus without complication ([place])   . Dysphagia 02/10/2019  . Dysrhythmia   . Enlarged prostate 05/15/2018  . Gait abnormality 06/20/2018  .  GI bleed 04/02/2014  . History of colon cancer 04/02/2014   Colonoscopy Dr. Deatra Ina July 2015 = 3 mm sessile polyp in the sigmoid colon.  Pathology showed tubular adenoma without high-grade dysplasia.  Surveillance colonoscopy 7 to 10 years.  . History of diabetes mellitus 02/03/2018  . Hypertension   . Left hip pain 04/14/2019  . Neuropathy 02/18/2018  . Sebaceous cyst 05/15/2018  . Seizure ([place]) 04/02/2014  . Seizures ([place])   . Systolic heart failure ([place]) 05/14/2019   Diagnosed August 2020 No ischemic work-up    Medications:   Scheduled: . atorvastatin  20 mg Oral QHS  . calcitonin  4 Units/kg Subcutaneous BID  . feeding supplement  1 Container Oral BID BM  .  feeding supplement (ENSURE ENLIVE)  237 mL Oral Q24H  . lidocaine  1 patch Transdermal Q24H  . metoprolol tartrate  12.5 mg Oral Daily  . torsemide  40 mg Oral BID  . Warfarin - Pharmacist Dosing Inpatient   Does not apply q1600  . zonisamide  300 mg Oral BID    Medications Prior to Admission  Medication Sig Dispense Refill  . acetaminophen (TYLENOL) 325 MG tablet Take 2 tablets (650 mg total) by mouth every 6 (six) hours as needed for mild pain (or Fever >/= 101). 30 tablet 0  . atorvastatin (LIPITOR) 40 MG tablet Take 20 mg by mouth at bedtime.    . Calcium Carbonate (CALCIUM 500 PO) Take 500 mg by mouth in the morning and at bedtime.    . Cholecalciferol (VITAMIN D-3) 1000 units CAPS Take 1,000 Units by mouth daily.    Mar[place] Kitchen DARBEPOETIN ALFA IJ Inject 1 each as directed every 30 (thirty) days. Once a month     . fluticasone (FLONASE) 50 MCG/ACT nasal spray Place 1 spray into both nostrils daily. 16 g 2  . hydrocortisone cream 1 % Apply 1 application topically every 4 (four) hours.    Marland Kitchen latanoprost (XALATAN) 0.005 % ophthalmic solution Place 1 drop into both eyes at bedtime.    . metoprolol tartrate (LOPRESSOR) 50 MG tablet Take 25 mg by mouth daily.    . Multiple Vitamins-Minerals (ONE-A-DAY MENS 50+ ADVANTAGE) TABS Take 1 tablet by mouth daily.     Marland Kitchen torsemide (DEMADEX) 20 MG tablet Take 40 mg by mouth daily.     . vitamin B-12 (CYANOCOBALAMIN) 1000 MCG tablet Take 1,000 mcg by mouth daily.    Marland Kitchen warfarin (COUMADIN) 6 MG tablet TAKE 1 TAB ONCE-DAILY AT 6PM ON TUESDAYS AND FRIDAYS. TAKE 1/2 PILL ALL OTHER DAYS. (Patient taking differently: Take 6 mg by mouth See admin instructions. Takes 6 mg tablet on  Tuesday, Thursday and Saturday. Then 3 mg tablet on all other days.) 60 tablet 1  . zonisamide (ZONEGRAN) 100 MG capsule Take 300 mg by mouth 2 (two) times daily.     . cephALEXin (KEFLEX) 500 MG capsule Take 2 capsules (1,000 mg total) by mouth 2 (two) times daily. (Patient not taking: Reported  on 03/11/2020) 28 capsule 0  . Elastic Bandages & Supports (LUMBAR BACK BRACE/SUPPORT PAD) MISC 1 each by Does not apply route as needed. 1 each 0    ALLERGIES:   Allergies  Allergen Reactions  . Tegretol [Carbamazepine] Other (See Comments)    Made patient's feet swell  . Depakote [Divalproex Sodium] Rash  . Dilantin [Phenytoin Sodium Extended] Rash  . Phenobarbital Rash    FAM HX: Family History  Problem Relation Age of Onset  . Diabetes Mellitus II  Mother   . Prostate cancer Father   . Diabetes Mellitus II Brother     Social History:   reports that he quit smoking about 50 years ago. He has never used smokeless tobacco. He reports that he does not drink alcohol and does not use drugs.  ROS: ROS: all other systems reviewed and are negative except as per HPI  Blood pressure (!) 108/46, pulse 83, temperature 98.1 F (36.7 C), temperature source Oral, resp. rate 19, height 5\' 6"  (1.676 m), weight 72 kg, SpO2 100 %. PHYSICAL EXAM: Physical Exam  GEN: NAD, sitting in chair, uncomfortable HEENT EOMI PERRL  NECK no JVD PULM bibasilar crackles CV irregular no m/r/g ABD: + soft and nontender midline hernia present EXT 2+ LE edema NEURO AAO x 3 no asterixis    Results for orders placed or performed during the hospital encounter of 03/11/20 (from the past 48 hour(s))  Protime-INR     Status: Abnormal   Collection Time: 03/16/20  3:03 AM  Result Value Ref Range   Prothrombin Time 24.7 (H) 11.4 - 15.2 seconds   INR 2.3 (H) 0.8 - 1.2    Comment: (NOTE) INR goal varies based on device and disease states. Performed at [facility], [place] [address] [place], [place] [address]   CBC     Status: Abnormal   Collection Time: 03/16/20  3:03 AM  Result Value Ref Range   WBC 8.5 4.0 - 10.5 K/uL   RBC 3.12 (L) 4.22 - 5.81 MIL/uL   Hemoglobin 9.1 (L) 13.0 - 17.0 g/dL   HCT 30.0 (L) 39 - 52 %   MCV 96.2 80.0 - 100.0 fL   MCH 29.2 26.0 - 34.0 pg   MCHC 30.3 30.0 - 36.0 g/dL    RDW 13.5 11.5 - 15.5 %   Platelets 130 (L) 150 - 400 K/uL   nRBC 0.0 0.0 - 0.2 %    Comment: Performed at [facility], [place] [address], [place], [place] [address]  Renal function panel     Status: Abnormal   Collection Time: 03/16/20  3:03 AM  Result Value Ref Range   Sodium 137 135 - 145 mmol/L   Potassium 4.1 3.5 - 5.1 mmol/L   Chloride 94 (L) 98 - 111 mmol/L   CO2 31 22 - 32 mmol/L   Glucose, Bld 126 (H) 70 - 99 mg/dL    Comment: Glucose reference range applies only to samples taken after fasting for at least 8 hours.   BUN 47 (H) 8 - 23 mg/dL   Creatinine, Ser 2.66 (H) 0.61 - 1.24 mg/dL   Calcium 12.4 (H) 8.9 - 10.3 mg/dL   Phosphorus 5.0 (H) 2.5 - 4.6 mg/dL   Albumin 3.0 (L) 3.5 - 5.0 g/dL   GFR calc non Af Amer 21 (L) >60 mL/min   GFR calc Af Amer 24 (L) >60 mL/min   Anion gap 12 5 - 15    Comment: Performed at [facility] [address] [place], [place] [address]  VITAMIN D 25 Hydroxy (Vit-D Deficiency, Fractures)     Status: None   Collection Time: 03/16/20 10:16 AM  Result Value Ref Range   Vit D, 25-Hydroxy 76.09 30 - 100 ng/mL    Comment: (NOTE) Vitamin D deficiency has been defined by the Institute of Medicine  and an Endocrine Society practice guideline as a level of serum 25-OH  vitamin D less than 20 ng/mL (1,2). The Endocrine Society went  on to  further define vitamin D insufficiency as a level between 21 and 29  ng/mL (2).  1. IOM (Institute of Medicine). 2010. Dietary reference intakes for  calcium and D. [place]: The [employer]. 2. Holick MF, Binkley [place], Bischoff-Ferrari HA, et al. Evaluation,  treatment, and prevention of vitamin D deficiency: an Endocrine  Society clinical practice guideline, JCEM. 2011 Jul; 96(7): 1911-30.  Performed at [facility], [place] [address] [place], [place] [address]   Protime-INR     Status: Abnormal   Collection Time: 03/17/20  3:38 AM  Result Value Ref Range   Prothrombin Time  24.7 (H) 11.4 - 15.2 seconds   INR 2.3 (H) 0.8 - 1.2    Comment: (NOTE) INR goal varies based on device and disease states. Performed at [facility], [place] [address] [place], [place] [address]   CBC     Status: Abnormal   Collection Time: 03/17/20  3:38 AM  Result Value Ref Range   WBC 7.7 4.0 - 10.5 K/uL   RBC 2.97 (L) 4.22 - 5.81 MIL/uL   Hemoglobin 8.7 (L) 13.0 - 17.0 g/dL   HCT 28.7 (L) 39 - 52 %   MCV 96.6 80.0 - 100.0 fL   MCH 29.3 26.0 - 34.0 pg   MCHC 30.3 30.0 - 36.0 g/dL   RDW 13.5 11.5 - 15.5 %   Platelets 131 (L) 150 - 400 K/uL   nRBC 0.0 0.0 - 0.2 %    Comment: Performed at [facility], [place] [address], [place], [place] [address]  Renal function panel     Status: Abnormal   Collection Time: 03/17/20  3:38 AM  Result Value Ref Range   Sodium 136 135 - 145 mmol/L   Potassium 4.1 3.5 - 5.1 mmol/L   Chloride 96 (L) 98 - 111 mmol/L   CO2 33 (H) 22 - 32 mmol/L   Glucose, Bld 107 (H) 70 - 99 mg/dL    Comment: Glucose reference range applies only to samples taken after fasting for at least 8 hours.   BUN 50 (H) 8 - 23 mg/dL   Creatinine, Ser 2.69 (H) 0.61 - 1.24 mg/dL   Calcium 12.3 (H) 8.9 - 10.3 mg/dL   Phosphorus 5.2 (H) 2.5 - 4.6 mg/dL   Albumin 3.0 (L) 3.5 - 5.0 g/dL   GFR calc non Af Amer 21 (L) >60 mL/min   GFR calc Af Amer 24 (L) >60 mL/min   Anion gap 7 5 - 15    Comment: Performed at [facility] [address] [place], [place] [address]    No results found.  Assessment/Plan 1.  Hypercalcemia: frontrunner to dx is Ca and vit d supps.  Reviewed med list on dtr's phone- on Phoslo and cholecalciferol.  There is some confusion about meds so wonder if he's on Ca carbonate as well +/- calcitriol.  Holding Ca and Vit D supps.  Will stop IVFs d/t swelling, continue torsemide.  Doing [place] calcitonin x 4 doses.  Holding off on bisphosphonate d/t CKD IV.  No evidence of MM, PTH appropriately suppressed, PTHrP pending.  Could consider checking PSA.   CXR without overt masses, had c-scope 2015.  Will do renal US to see if there is an obvious mass too.  2.  CKD IV: at baseline[facility]    3.  Chronic systolic CHF: EF 60-73%   Iyania Denne 03/17/2020, 10:49 AM

## 2020-03-17 NOTE — Social Work (Signed)
[facility] number is [phone], Josem Kaufmann is good for 6/23- 6/25.  Care coordinator is Alison Murray, updated clinicals can be faxed for continued stay if needed.   Emeterio Reeve, Latanya Presser, [place] Social Worker [phone]

## 2020-03-17 NOTE — Progress Notes (Signed)
Physical Therapy Treatment Patient Details Name: Andrew [name] MRN: [id number] DOB: 28-May-1934 Today's Date: 03/17/2020    History of Present Illness 86 y.o. male admitted with weakness on 6/18.  MRI/CT on 03/11/20 findings consistent with a meningioma within the left frontal lobe. Remote lacunar infarcts involving the left basal ganglia and left thalamus. Pt had episode of chest pain today - MD note reports VSS and "EKG obtained at bedside with atrial fibrillation and PVCs; however, no significant ST segment or T wave changes noted. No immediate interventions at this time.", Past medical history of severe bilateral carpal tunnel, dysphagia, AAA without rupture, atrial fibrillation on warfarin, CKD stage III/IV,  and systolic heart failure.    PT Comments    Pt was able to ambulate in hall today.  Performed same distance but with fewer rest breaks.  Continues to need cues for transfer technique, safety, and rest breaks.  Continue to recommend close chair follow as pt's legs are shakey and buckles when fatigued.  All VSS on 2 LPM O2.      Follow Up Recommendations  SNF     Equipment Recommendations  None recommended by PT    Recommendations for Other Services       Precautions / Restrictions Precautions Precautions: Fall Precaution Comments: watch vital signs    Mobility  Bed Mobility               General bed mobility comments: in recliner at start of session  Transfers Overall transfer level: Needs assistance Equipment used: Rolling walker (2 wheeled) Transfers: Sit to/from Stand Sit to Stand: Min assist         General transfer comment: Sit to stand x 3; cues for safe hand placement, getting to edge of chair, and use of momentum  Ambulation/Gait Ambulation/Gait assistance: Min assist Gait Distance (Feet): 120 Feet (120' then 60') Assistive device: Rolling walker (2 wheeled) Gait Pattern/deviations: Trunk flexed;Step-to pattern;Decreased stance time - left;Wide  base of support     General Gait Details: Pt with trunk flexed and walker too far forward -frequent cues to get closer to walker and pt able to correct but still with flexed trunk (dtr reports baseline).  As pt would fatigue he would increase speed/too much forward momentum and R leg would buckle.  He required cues for rest breaks at this point.  Nurse Tech provided chair follow.   Stairs             Wheelchair Mobility    Modified Rankin (Stroke Patients Only) Modified Rankin (Stroke Patients Only) Pre-Morbid Rankin Score: Moderate disability Modified Rankin: Moderately severe disability     Balance Overall balance assessment: Needs assistance Sitting-balance support: Feet supported Sitting balance-Leahy Scale: Good     Standing balance support: Bilateral upper extremity supported Standing balance-Leahy Scale: Poor Standing balance comment: reliant on external support                            Cognition Arousal/Alertness: Awake/alert Behavior During Therapy: WFL for tasks assessed/performed Overall Cognitive Status: Within Functional Limits for tasks assessed                                        Exercises General Exercises - Lower Extremity Ankle Circles/Pumps: AROM;Both;20 reps;Seated Long Arc Quad: AROM;Both;20 reps;Seated Hip Flexion/Marching: AROM;Both;20 reps;Seated    General Comments General comments (skin  integrity, edema, etc.): VSS on 2 LPM O2      Pertinent Vitals/Pain Pain Assessment: Faces Faces Pain Scale: Hurts a little bit Pain Location: bil legs Pain Descriptors / Indicators: Sore Pain Intervention(s): Limited activity within patient's tolerance;Monitored during session    Home Living                      Prior Function            PT Goals (current goals can now be found in the care plan section) Acute Rehab PT Goals Patient Stated Goal: go to SNF PT Goal Formulation: With patient/family Time  For Goal Achievement: 03/27/20 Potential to Achieve Goals: Good Progress towards PT goals: Progressing toward goals    Frequency    Min 3X/week      PT Plan Current plan remains appropriate    Co-evaluation              AM-PAC PT "6 Clicks" Mobility   Outcome Measure  Help needed turning from your back to your side while in a flat bed without using bedrails?: A Little Help needed moving from lying on your back to sitting on the side of a flat bed without using bedrails?: A Little Help needed moving to and from a bed to a chair (including a wheelchair)?: A Little Help needed standing up from a chair using your arms (e.g., wheelchair or bedside chair)?: A Little Help needed to walk in hospital room?: A Little Help needed climbing 3-5 steps with a railing? : A Lot 6 Click Score: 17    End of Session Equipment Utilized During Treatment: Gait belt;Oxygen Activity Tolerance: Patient tolerated treatment well Patient left: in chair;with call bell/phone within reach;with chair alarm set Nurse Communication: Mobility status PT Visit Diagnosis: Other abnormalities of gait and mobility (R26.89);History of falling (Z91.81);Muscle weakness (generalized) (M62.81);Unsteadiness on feet (R26.81)     Time: 7829-5621 PT Time Calculation (min) (ACUTE ONLY): 27 min  Charges:  $Gait Training: 8-22 mins $Therapeutic Exercise: 8-22 mins                     Abran Richard, PT Acute Rehab Services Pager [phone] [facility] [place] 03/17/2020, 5:33 PM

## 2020-03-17 NOTE — Progress Notes (Signed)
ANTICOAGULATION CONSULT NOTE - Initial Consult  Pharmacy Consult for Warfarin  Indication: atrial fibrillation  Allergies  Allergen Reactions  . Tegretol [Carbamazepine] Other (See Comments)    Made patient's feet swell  . Depakote [Divalproex Sodium] Rash  . Dilantin [Phenytoin Sodium Extended] Rash  . Phenobarbital Rash    Patient Measurements: Height: 5\' 6"  (167.6 cm) Weight: 72 kg (158 lb 11.7 oz) IBW/kg (Calculated) : 63.8  Vital Signs: Temp: 98.1 F (36.7 C) (06/24 0829) Temp Source: Oral (06/24 0829) BP: 108/46 (06/24 0829) Pulse Rate: 83 (06/24 0829)  Labs: Recent Labs    03/15/20 0401 03/15/20 0401 03/16/20 0303 03/17/20 0338  HGB 9.6*   < > 9.1* 8.7*  HCT 31.0*  --  30.0* 28.7*  PLT 132*  --  130* 131*  LABPROT 28.4*  --  24.7* 24.7*  INR 2.8*  --  2.3* 2.3*  CREATININE 2.77*  --  2.66* 2.69*   < > = values in this interval not displayed.    Estimated Creatinine Clearance: 18.4 mL/min (A) (by C-G formula based on SCr of 2.69 mg/dL (H)).  Assessment: Patient is a 80 yom that presented to the ED with c/o right arm weakness. Patient has a hx of Afib and is on warfarin PTA for this.  Home warfarin regimen is 6 mg on Tuesday, Thursday, Saturday and 3 mg on all other days.    INR 2.3 within goal today  Goal of Therapy:  INR 2-3 Monitor platelets by anticoagulation protocol: Yes   Plan:  Warfarin 6 mg x 1 Daily INR  Barth Kirks, PharmD, BCPS, BCCCP Clinical Pharmacist [phone]  Please check AMION for all [place] numbers  03/17/2020 11:07 AM

## 2020-03-17 NOTE — Progress Notes (Signed)
Paged by RN regarding severe chest pain upon returning from renal US. Patient evaluated at bedside. He is resting comfortably in bed. Reports left sided chest pain localized to left breast. It is non-radiating and he describes it as a dull pain. Vital signs: BP 134/97, HR 85-95, SpO2 100% on 2L [place], RR 14-16. Cardiovascular examination with irregularly irregular rhythm; no new murmurs/rubs/gallops noted. EKG obtained at bedside with atrial fibrillation and PVCs; however, no significant ST segment or T wave changes noted. No immediate interventions at this time. Will continue to monitor and add dilaudid po prn for severe pain.

## 2020-03-17 NOTE — Progress Notes (Signed)
Nutrition Follow-up  DOCUMENTATION CODES:   Severe malnutrition in context of chronic illness  INTERVENTION:   Nepro Shake po daily, each supplement provides 425 kcal and 19 grams protein  D/c Ensure Enlive  Continue Boost Breeze po BID, each supplement provides 250 kcal and 9 grams of protein  Continue Magic cup TID with meals, each supplement provides 290 kcal and 9 grams of protein  NUTRITION DIAGNOSIS:   Severe Malnutrition related to chronic illness (dysphagia) as evidenced by energy intake < or equal to 75% for > or equal to 1 month, severe fat depletion, severe muscle depletion.  Ongoing.  GOAL:   Patient will meet greater than or equal to 90% of their needs  Progressing.  MONITOR:   PO intake, Supplement acceptance, Skin, Weight trends, Labs, I & O's  REASON FOR ASSESSMENT:   Consult Diet education  ASSESSMENT:   Pt presenting with weakness and hypercalcemia. PMH significant for bilateral carpal tunnel, dysphagia, AAA without rupture, Afib, CKD stage 3/4, and heart failure.  6/19 diet downgraded to Dysphagia 3 with thin liquids 6/20 diet downgraded to Dysphagia 2 with thin liquids 6/21 s/p MBS, diet downgraded to Dysphagia 1 diet with thin liquids 6/24 diet advanced to soft  No PO intake recorded since last RD assessment. Per conversation with MD, pt consuming supplements well. Pt's daughter feels pt will do better with soft diet than he did with dysphagia 1.   UOP: 1,636ml x24 hours I/O: -1,527.8ml since admit  Labs: phosphorus 5.2 (H), Corrected Ca 13.1 (H) Medications: Miacalcin, Boost Breeze po BID, Ensure Enlive po daily, coumadin  Diet Order:   Diet Order            DIET SOFT Room service appropriate? Yes; Fluid consistency: Thin  Diet effective now                 EDUCATION NEEDS:   Not appropriate for education at this time  Skin:  Skin Assessment: Skin Integrity Issues: Skin Integrity Issues:: Stage II Stage II: buttocks  Last  BM:  unknown  Height:   Ht Readings from Last 1 Encounters:  03/11/20 5\' 6"  (1.676 m)    Weight:   Wt Readings from Last 1 Encounters:  03/15/20 72 kg    BMI:  Body mass index is 25.62 kg/m.  Estimated Nutritional Needs:   Kcal:  1900-2100  Protein:  95-105 grams  Fluid:  >1.9L/d    Andrew Ina, MS, RD, LDN RD pager number and weekend/on-call pager number located in [place].

## 2020-03-18 DIAGNOSIS — E673 Hypervitaminosis D: Secondary | ICD-10-CM

## 2020-03-18 DIAGNOSIS — G9340 Encephalopathy, unspecified: Secondary | ICD-10-CM

## 2020-03-18 LAB — CBC
HCT: 24.3 % — ABNORMAL LOW (ref 39.0–52.0)
Hemoglobin: 7.7 g/dL — ABNORMAL LOW (ref 13.0–17.0)
MCH: 29.5 pg (ref 26.0–34.0)
MCHC: 31.7 g/dL (ref 30.0–36.0)
MCV: 93.1 fL (ref 80.0–100.0)
Platelets: 142 10*3/uL — ABNORMAL LOW (ref 150–400)
RBC: 2.61 MIL/uL — ABNORMAL LOW (ref 4.22–5.81)
RDW: 13.5 % (ref 11.5–15.5)
WBC: 10 10*3/uL (ref 4.0–10.5)
nRBC: 0 % (ref 0.0–0.2)

## 2020-03-18 LAB — RENAL FUNCTION PANEL
Albumin: 3.1 g/dL — ABNORMAL LOW (ref 3.5–5.0)
Anion gap: 11 (ref 5–15)
BUN: 56 mg/dL — ABNORMAL HIGH (ref 8–23)
CO2: 33 mmol/L — ABNORMAL HIGH (ref 22–32)
Calcium: 12.2 mg/dL — ABNORMAL HIGH (ref 8.9–10.3)
Chloride: 90 mmol/L — ABNORMAL LOW (ref 98–111)
Creatinine, Ser: 3.03 mg/dL — ABNORMAL HIGH (ref 0.61–1.24)
GFR calc Af Amer: 21 mL/min — ABNORMAL LOW (ref >60)
GFR calc non Af Amer: 18 mL/min — ABNORMAL LOW (ref >60)
Glucose, Bld: 163 mg/dL — ABNORMAL HIGH (ref 70–99)
Phosphorus: 5 mg/dL — ABNORMAL HIGH (ref 2.5–4.6)
Potassium: 4.6 mmol/L (ref 3.5–5.1)
Sodium: 134 mmol/L — ABNORMAL LOW (ref 135–145)

## 2020-03-18 LAB — PROTIME-INR
INR: 3.1 — ABNORMAL HIGH (ref 0.8–1.2)
Prothrombin Time: 30.9 seconds — ABNORMAL HIGH (ref 11.4–15.2)

## 2020-03-18 MED ORDER — FENTANYL CITRATE (PF) 100 MCG/2ML IJ SOLN
12.5000 ug | INTRAMUSCULAR | Status: DC | PRN
  Administered 2020-03-19 – 2020-03-20 (×2): 12.5 ug via INTRAVENOUS
  Filled 2020-03-18 (×2): qty 2

## 2020-03-18 MED ORDER — SODIUM CHLORIDE 0.9 % IV SOLN
30.0000 mg | Freq: Once | INTRAVENOUS | Status: AC
  Administered 2020-03-18: 30 mg via INTRAVENOUS
  Filled 2020-03-18: qty 10

## 2020-03-18 MED ORDER — WARFARIN SODIUM 3 MG PO TABS
3.0000 mg | ORAL_TABLET | Freq: Once | ORAL | Status: AC
  Administered 2020-03-18: 3 mg via ORAL
  Filled 2020-03-18: qty 1

## 2020-03-18 NOTE — Progress Notes (Addendum)
[facility] Progress Note    Assessment/ Plan:    1.  Hypercalcemia: frontrunner to dx is Ca and vit d supps.  Reviewed med list on dtr's phone- on Phoslo and cholecalciferol.  There is some confusion about meds so wonder if he's on Ca carbonate as well +/- calcitriol.  Holding Ca and Vit D supps.  Will stop IVFs d/t swelling, continue torsemide.  Doing [place] calcitonin x 4 doses.  No evidence of MM, PTH appropriately suppressed, PTHrP pending.  Could consider checking PSA.  CXR without overt masses, had c-scope 2015.  Will do renal US to see if there is an obvious mass too--> none.  Since Ca not getting any better with [place] calcitonin and all the other measures, will do pamidronate 30 mg IV x 1 and follow response.    2.  CKD IV: at baseline, [facility].    3.  Chronic systolic CHF: EF 32-67%  4.  Dispo: pending- I see pall care follows him as OP  Subjective:    Ca not really getting any better.  Dtr reports legs are more swollen today.     Objective:   BP 115/69 (BP Location: Right Arm)   Pulse 100   Temp 97.7 F (36.5 C)   Resp 19   Ht 5\' 6"  (1.676 m)   Wt 71.4 kg   SpO2 98%   BMI 25.41 kg/m   Intake/Output Summary (Last 24 hours) at 03/18/2020 1330 Last data filed at 03/18/2020 0443 Gross per 24 hour  Intake --  Output 1500 ml  Net -1500 ml   Weight change:   Physical Exam: GEN: NAD, sitting on BSC HEENT EOMI PERRL  NECK no JVD PULM bibasilar crackles CV irregular no m/r/g ABD: + soft and nontender midline hernia present EXT 2+ LE edema NEURO AAO x 3 no asterixis  Imaging: US RENAL  Result Date: 03/17/2020 CLINICAL DATA:  Stage III B chronic kidney disease, hypertension, diabetes mellitus EXAM: RENAL / URINARY TRACT ULTRASOUND COMPLETE COMPARISON:  CT abdomen and pelvis 01/22/2011 FINDINGS: Right Kidney: Renal measurements: 9.7 x 5.4 x 5.6 cm = volume: 154 mL. Marked cortical thinning. Increased cortical echogenicity. No mass, hydronephrosis or  shadowing calcification. Left Kidney: Renal measurements: 12.0 x 5.5 x 4.7 cm = volume: 162 mL. Cortical thinning. Increased cortical echogenicity. Mild collecting system dilatation. Complex cystic mass at inferior pole 1.2 x 1.8 x 1.4 cm, containing multiple septations and irregular wall. Bladder: Appears normal for degree of bladder distention. Other: N/A IMPRESSION: Medical renal disease changes and cortical atrophy of both kidneys. Mild LEFT renal collecting system dilatation. Complex cystic lesion at the inferior pole 1.2 x 1.8 x 1.4 cm; this was not seen on the prior CT exam from 2012. Consider MR characterization of this lesion. Electronically Signed   By: Lavonia Dana M.D.   On: 03/17/2020 16:17    Labs: BMET Recent Labs  Lab 03/12/20 0425 03/13/20 1245 03/14/20 0420 03/15/20 0401 03/16/20 0303 03/17/20 0338 03/18/20 0508  NA 142 139 139 140 137 136 134*  K 4.6 4.1 4.5 4.3 4.1 4.1 4.6  CL 102 98 96* 96* 94* 96* 90*  CO2 33* 32 35* 35* 31 33* 33*  GLUCOSE 141* 217* 154* 114* 126* 107* 163*  BUN 47* 52* 55* 50* 47* 50* 56*  CREATININE 2.64* 2.84* 2.87* 2.77* 2.66* 2.69* 3.03*  CALCIUM 12.2* 11.6* 12.2* 12.0* 12.4* 12.3* 12.2*  PHOS  --   --  4.7* 4.4 5.0* 5.2*  5.0*   CBC Recent Labs  Lab 03/11/20 1627 03/11/20 1714 03/15/20 0401 03/16/20 0303 03/17/20 0338 03/18/20 0508  WBC 7.9   < > 7.5 8.5 7.7 10.0  NEUTROABS 5.4  --   --   --   --   --   HGB 9.6*   < > 9.6* 9.1* 8.7* 7.7*  HCT 31.5*   < > 31.0* 30.0* 28.7* 24.3*  MCV 97.5   < > 96.9 96.2 96.6 93.1  PLT 147*   < > 132* 130* 131* 142*   < > = values in this interval not displayed.    Medications:    . atorvastatin  20 mg Oral QHS  . calcitonin  4 Units/kg Subcutaneous BID  . feeding supplement  1 Container Oral BID BM  . feeding supplement (ENSURE ENLIVE)  237 mL Oral Q24H  . lidocaine  1 patch Transdermal Q24H  . metoprolol tartrate  12.5 mg Oral Daily  . torsemide  40 mg Oral BID  . warfarin  3 mg Oral  ONCE-1600  . Warfarin - Pharmacist Dosing Inpatient   Does not apply q1600  . zonisamide  300 mg Oral BID      Madelon Lips, MD 03/18/2020, 1:30 PM

## 2020-03-18 NOTE — Progress Notes (Signed)
ANTICOAGULATION CONSULT NOTE - Initial Consult  Pharmacy Consult for Warfarin  Indication: atrial fibrillation  Allergies  Allergen Reactions  . Tegretol [Carbamazepine] Other (See Comments)    Made patient's feet swell  . Depakote [Divalproex Sodium] Rash  . Dilantin [Phenytoin Sodium Extended] Rash  . Phenobarbital Rash    Patient Measurements: Height: 5\' 6"  (167.6 cm) Weight: 71.4 kg (157 lb 6.5 oz) IBW/kg (Calculated) : 63.8  Vital Signs: Temp: 97.7 F (36.5 C) (06/25 0745) BP: 115/69 (06/25 0745) Pulse Rate: 100 (06/25 0745)  Labs: Recent Labs    03/16/20 0303 03/16/20 0303 03/17/20 0338 03/18/20 0508  HGB 9.1*   < > 8.7* 7.7*  HCT 30.0*  --  28.7* 24.3*  PLT 130*  --  131* 142*  LABPROT 24.7*  --  24.7* 30.9*  INR 2.3*  --  2.3* 3.1*  CREATININE 2.66*  --  2.69* 3.03*   < > = values in this interval not displayed.    Estimated Creatinine Clearance: 16.4 mL/min (A) (by C-G formula based on SCr of 3.03 mg/dL (H)).  Assessment: Patient is a 21 yom that presented to the ED with c/o right arm weakness. Patient has a hx of Afib and is on warfarin PTA for this.  Home warfarin regimen is 6 mg on Tuesday, Thursday, Saturday and 3 mg on all other days.    INR 3.1 just slightly high  Goal of Therapy:  INR 2-3 Monitor platelets by anticoagulation protocol: Yes   Plan:  Warfarin 3 mg x 1 Daily INR  Barth Kirks, PharmD, BCPS, BCCCP Clinical Pharmacist [phone]  Please check AMION for all [place] numbers  03/18/2020 11:05 AM

## 2020-03-18 NOTE — Progress Notes (Signed)
Subjective: HD 7 Overnight, no acute events reported.  [name] was evaluated at bedside this morning. He is sitting up in chair. Daughter, [name], is at bedside. [name] expresses concerns regarding increased confusion this morning and continued sacral pain. He notes that it is now radiating to the right leg and groin. Discussed with daughter that his confusion may be from the pain medication started yesterday.    Objective:  Vital signs in last 24 hours: Vitals:   03/17/20 0829 03/17/20 1652 03/17/20 2246 03/18/20 0500  BP: (!) 108/46 117/79 (!) 118/52   Pulse: 83 99 99   Resp:   17   Temp: 98.1 F (36.7 C)  98.3 F (36.8 C)   TempSrc: Oral  Oral   SpO2:   97%   Weight:    71.4 kg  Height:       CBC Latest Ref Rng & Units 03/18/2020 03/17/2020 03/16/2020  WBC 4.0 - 10.5 K/uL 10.0 7.7 8.5  Hemoglobin 13.0 - 17.0 g/dL 7.7(L) 8.7(L) 9.1(L)  Hematocrit 39 - 52 % 24.3(L) 28.7(L) 30.0(L)  Platelets 150 - 400 K/uL 142(L) 131(L) 130(L)   BMP Latest Ref Rng & Units 03/18/2020 03/17/2020 03/16/2020  Glucose 70 - 99 mg/dL 163(H) 107(H) 126(H)  BUN 8 - 23 mg/dL 56(H) 50(H) 47(H)  Creatinine 0.61 - 1.24 mg/dL 3.03(H) 2.69(H) 2.66(H)  BUN/Creat Ratio 10 - 24 - - -  Sodium 135 - 145 mmol/L 134(L) 136 137  Potassium 3.5 - 5.1 mmol/L 4.6 4.1 4.1  Chloride 98 - 111 mmol/L 90(L) 96(L) 94(L)  CO2 22 - 32 mmol/L 33(H) 33(H) 31  Calcium 8.9 - 10.3 mg/dL 12.2(H) 12.3(H) 12.4(H)    Physical Exam  Constitutional: chronically ill appearing elderly male, sitting in bedside chair; does appear somewhat uncomfortable.  HENT:  Normocephalic and atraumatic; edentulous, MMM, EOMI and conjunctivae nl Cardiovascular: irregularly irregular rhythm; S1 and S2 present, no murmurs/rubs/gallops  Respiratory: No respiratory distress, Effort normal and breath sounds normal.  No wheezes or rales on auscultation GI: Soft. Bowel sounds are normal. No distension. There is no tenderness.  Extremities: 2+  pitting edema to bilateral lower extremities; ROM slightly limited in bilateral lower extremities by pain in lower back; 5/5 strength in bilateral lower and upper extremities Neurological: Is alert and oriented x3, no apparent focal deficits noted; mild confusion  Skin: Warm and dry, no erythema or rash noted   RENAL ULTRASOUND:  IMPRESSION: Medical renal disease changes and cortical atrophy of both kidneys. Mild LEFT renal collecting system dilatation. Complex cystic lesion at the inferior pole 1.2 x 1.8 x 1.4 cm; this was not seen on the prior CT exam from 2012. Consider MR characterization of this lesion.  Assessment/Plan: [name] is an 86 yr old with PMHx of HFrEF (EF 30-35%), atrial fibrillation on warfarin therapy, advanced renal disease, meningioma, and AAA without rupture admitted for symptomatic hypercalcemia.    Symptomatic hypercalcemia 2/2 hypervitaminosis D vs malnutrition: Generalized weakness is mildly improved. Nephrology consulted yesterday for persistent hypercalcemia in setting of hypervitaminosis D. Patient received two doses of subcutaneous calcitonin. Patient's calcium levels remain elevated 12.4>12.3>12.2 this morning. Also obtained renal ultrasound which demonstrated ~1.8cm complex cystic lesion at inferior pole of left kidney.  Patient continued on torsemide with ~2L UOP over past 24 hours. Still has 2+ pitting edema to bilateral lower extremities. Renal function is stable.   - Nephrology consulted, appreciate recommendations - Consider MRI for further evaluation of renal cyst  - Continue torsemide  40mg  bid - Trend renal function - F/u PTHrP level  Acute encephalopathy: Patient with increased confusion today. Per daughter, patient endorses wanting to put a picture of his church on the wall and is asking for tape. On examination, patient is alert and oriented x3 and answering questions appropriately. He did receive two doses of dilaudid overnight. Suspect his  encephalopathy may be as a result of this. - Discontinue dilaudid   Chronic back pain:  Patient with ongoing sacral and lower back pain with new radiation to the right leg. Suspect from his severe osteoarthritis. Patient did receive dilaudid; however noted to have worsening confusion this morning.  - Acetaminophen 650mg  q6h prn - IV fentanyl 12.12mcg q3h prn - Lidocaine patch  CKDIV: Anemia of chronic disease: Baseline BUN/Cr 46/2.76. Renal function stable. Did have slight decrease in Hb this morning 8.7>7.7 with INR 2.3>3.1. However, no signs of active bleeding. - Trend renal function - Strict I&O and daily weights - Avoid nephrotoxic agents   Permanent atrial fibrillation: Chronic systolic heart failure: Currently rate controlled with metoprolol and on warfarin dosing per pharmacy for his atrial fibrillation.He does appear hypervolemic on examination this morning with 2+ BLE pitting edema. Fluids discontinued yesterday. Patient continued on torsemide with good urine output - Warfarin per dosing pharmacy - Torsemide 40mg  bid  - Continue metoprolol 12.5mg  bid  FEN/GI: Diet: Soft foods Fluids: None Electrolytes: Monitor and replete prn  DVT Prophylaxis: Warfarin Code status: DNR/DNI  Prior to Admission Living Arrangement: Home Anticipated Discharge Location: SNF Barriers to Discharge: Continued medical management Dispo: Anticipated discharge in approximately 3-4 day(s).   Andrew Heck, MD  Internal Medicine, PGY-1 03/18/2020, 6:50 AM Pager:[phone] After 5pm on weekdays and 1pm on weekends: On Call pager [phone]

## 2020-03-18 NOTE — Progress Notes (Signed)
Physical Therapy Treatment Patient Details Name: Andrew [name] MRN: [id number] DOB: November 09, 1933 Today's Date: 03/18/2020    History of Present Illness 86 y.o. male admitted with weakness on 6/18.  MRI/CT on 03/11/20 findings consistent with a meningioma within the left frontal lobe. Remote lacunar infarcts involving the left basal ganglia and left thalamus. Pt had episode of chest pain today - MD note reports VSS and "EKG obtained at bedside with atrial fibrillation and PVCs; however, no significant ST segment or T wave changes noted. No immediate interventions at this time., Past medical history of severe bilateral carpal tunnel, dysphagia, AAA without rupture, atrial fibrillation on warfarin, CKD stage III/IV,  and systolic heart failure.    PT Comments    Pt with good tolerance for hallway ambulation this session, VSS with HRmax 120 bpm and 95% and greater on RA. 2LO2 reapplied after session. Pt very fatigued post-gait, unable to tolerate LE strengthening exercises this day. PT to continue to follow acutely.    Follow Up Recommendations  SNF     Equipment Recommendations  None recommended by PT    Recommendations for Other Services       Precautions / Restrictions Precautions Precautions: Fall Restrictions Weight Bearing Restrictions: No    Mobility  Bed Mobility Overal bed mobility: Needs Assistance             General bed mobility comments: in recliner at start of session  Transfers Overall transfer level: Needs assistance Equipment used: Rolling walker (2 wheeled) Transfers: Sit to/from Stand Sit to Stand: Min assist;Mod assist         General transfer comment: for power up, steadying. Sit to stand x2, from recliner at start and mid-ambulation. Verbal cuing for hand placement when rising and sitting, followed 50% of the time.  Ambulation/Gait Ambulation/Gait assistance: Min assist;+2 safety/equipment (chair follow) Gait Distance (Feet): 100 Feet  (+90) Assistive device: Rolling walker (2 wheeled) Gait Pattern/deviations: Trunk flexed;Wide base of support;Step-through pattern;Decreased stride length;Decreased dorsiflexion - left;Decreased dorsiflexion - right Gait velocity: decr   General Gait Details: Min assist to steady, close chair follow provided by daughter. Verbal cuing for increased foot clearance bilaterally R>L, upright posture, and placemetn in RW. Seated rest break x1, VSS with pt on RA.   Stairs             Wheelchair Mobility    Modified Rankin (Stroke Patients Only) Modified Rankin (Stroke Patients Only) Pre-Morbid Rankin Score: Moderate disability Modified Rankin: Moderately severe disability     Balance Overall balance assessment: Needs assistance Sitting-balance support: Feet supported Sitting balance-Leahy Scale: Good     Standing balance support: Bilateral upper extremity supported Standing balance-Leahy Scale: Poor Standing balance comment: reliant on external support                            Cognition Arousal/Alertness: Awake/alert Behavior During Therapy: Flat affect Overall Cognitive Status: Within Functional Limits for tasks assessed                                 General Comments: follows commands well, little change from flat affect during session and pt is difficult to read      Exercises      General Comments        Pertinent Vitals/Pain Pain Assessment: Faces Faces Pain Scale: Hurts a little bit Pain Location: LEs, back, sacrum Pain Descriptors /  Indicators: Sore;Discomfort;Grimacing Pain Intervention(s): Limited activity within patient's tolerance;Monitored during session;Repositioned    Home Living                      Prior Function            PT Goals (current goals can now be found in the care plan section) Acute Rehab PT Goals Patient Stated Goal: go to SNF PT Goal Formulation: With patient/family Time For Goal  Achievement: 03/27/20 Potential to Achieve Goals: Good Progress towards PT goals: Progressing toward goals    Frequency    Min 3X/week      PT Plan Current plan remains appropriate    Co-evaluation              AM-PAC PT "6 Clicks" Mobility   Outcome Measure  Help needed turning from your back to your side while in a flat bed without using bedrails?: A Little Help needed moving from lying on your back to sitting on the side of a flat bed without using bedrails?: A Little Help needed moving to and from a bed to a chair (including a wheelchair)?: A Little Help needed standing up from a chair using your arms (e.g., wheelchair or bedside chair)?: A Little Help needed to walk in hospital room?: A Little Help needed climbing 3-5 steps with a railing? : A Lot 6 Click Score: 17    End of Session Equipment Utilized During Treatment: Gait belt;Oxygen (O2 reapplied post-gait) Activity Tolerance: Patient tolerated treatment well Patient left: in chair;with call bell/phone within reach;with family/visitor present (per daughter, pt does not get up without assist and she will ensure he gets back to bed with staff) Nurse Communication: Mobility status PT Visit Diagnosis: Other abnormalities of gait and mobility (R26.89);History of falling (Z91.81);Muscle weakness (generalized) (M62.81);Unsteadiness on feet (R26.81)     Time: 7026-3785 PT Time Calculation (min) (ACUTE ONLY): 19 min  Charges:  $Gait Training: 8-22 mins                     Harveen Flesch E, PT [place] Pager [phone]  Office [phone]  Roxine Caddy D Elonda Husky 03/18/2020, 4:42 PM

## 2020-03-18 NOTE — Plan of Care (Signed)
  Problem: Health Behavior/Discharge Planning: Goal: Ability to manage health-related needs will improve Outcome: Progressing   Problem: Clinical Measurements: Goal: Ability to maintain clinical measurements within normal limits will improve Outcome: Progressing Goal: Will remain free from infection Outcome: Progressing Goal: Respiratory complications will improve Outcome: Progressing   Problem: Elimination: Goal: Will not experience complications related to bowel motility Outcome: Progressing Goal: Will not experience complications related to urinary retention Outcome: Progressing   Problem: Pain Managment: Goal: General experience of comfort will improve Outcome: Progressing   Problem: Safety: Goal: Ability to remain free from injury will improve Outcome: Progressing   Problem: Skin Integrity: Goal: Risk for impaired skin integrity will decrease Outcome: Progressing

## 2020-03-19 LAB — CBC
HCT: 21.5 % — ABNORMAL LOW (ref 39.0–52.0)
Hemoglobin: 6.8 g/dL — CL (ref 13.0–17.0)
MCH: 29.1 pg (ref 26.0–34.0)
MCHC: 31.6 g/dL (ref 30.0–36.0)
MCV: 91.9 fL (ref 80.0–100.0)
Platelets: 142 K/uL — ABNORMAL LOW (ref 150–400)
RBC: 2.34 MIL/uL — ABNORMAL LOW (ref 4.22–5.81)
RDW: 13.7 % (ref 11.5–15.5)
WBC: 11.8 K/uL — ABNORMAL HIGH (ref 4.0–10.5)
nRBC: 0 % (ref 0.0–0.2)

## 2020-03-19 LAB — RENAL FUNCTION PANEL
Albumin: 3.1 g/dL — ABNORMAL LOW (ref 3.5–5.0)
Anion gap: 13 (ref 5–15)
BUN: 61 mg/dL — ABNORMAL HIGH (ref 8–23)
CO2: 32 mmol/L (ref 22–32)
Calcium: 11.4 mg/dL — ABNORMAL HIGH (ref 8.9–10.3)
Chloride: 89 mmol/L — ABNORMAL LOW (ref 98–111)
Creatinine, Ser: 3.22 mg/dL — ABNORMAL HIGH (ref 0.61–1.24)
GFR calc Af Amer: 19 mL/min — ABNORMAL LOW
GFR calc non Af Amer: 17 mL/min — ABNORMAL LOW
Glucose, Bld: 125 mg/dL — ABNORMAL HIGH (ref 70–99)
Phosphorus: 5.6 mg/dL — ABNORMAL HIGH (ref 2.5–4.6)
Potassium: 4.4 mmol/L (ref 3.5–5.1)
Sodium: 134 mmol/L — ABNORMAL LOW (ref 135–145)

## 2020-03-19 LAB — PROTIME-INR
INR: 3.9 — ABNORMAL HIGH (ref 0.8–1.2)
Prothrombin Time: 37.2 seconds — ABNORMAL HIGH (ref 11.4–15.2)

## 2020-03-19 LAB — HEMOGLOBIN AND HEMATOCRIT, BLOOD
HCT: 25.4 % — ABNORMAL LOW (ref 39.0–52.0)
Hemoglobin: 8.3 g/dL — ABNORMAL LOW (ref 13.0–17.0)

## 2020-03-19 LAB — PREPARE RBC (CROSSMATCH)

## 2020-03-19 MED ORDER — CALCITONIN (SALMON) 200 UNIT/ACT NA SOLN
2.0000 | Freq: Every day | NASAL | Status: DC
  Administered 2020-03-19 – 2020-03-21 (×3): 2 via NASAL
  Filled 2020-03-19 (×2): qty 3.7

## 2020-03-19 MED ORDER — SEVELAMER CARBONATE 800 MG PO TABS
800.0000 mg | ORAL_TABLET | Freq: Three times a day (TID) | ORAL | Status: DC
  Administered 2020-03-19 – 2020-03-21 (×5): 800 mg via ORAL
  Filled 2020-03-19 (×5): qty 1

## 2020-03-19 MED ORDER — SODIUM CHLORIDE 0.9% IV SOLUTION: Freq: Once | INTRAVENOUS | Status: DC

## 2020-03-19 MED ORDER — DARBEPOETIN ALFA 100 MCG/0.5ML IJ SOSY
100.0000 ug | PREFILLED_SYRINGE | Freq: Once | INTRAMUSCULAR | Status: AC
  Administered 2020-03-19: 100 ug via SUBCUTANEOUS
  Filled 2020-03-19: qty 0.5

## 2020-03-19 NOTE — TOC Progression Note (Addendum)
Transition of Care [facility]) - Progression Note    Patient Details  Name: AARIB PULIDO MRN: [id number] Date of Birth: 03/27/1934  Transition of Care [facility]) CM/SW [place], [place] Phone Number: 03/19/2020, 11:42 AM  Clinical Narrative:    Update: Received new insurance authorization, good thru 6/29. Reference [id number]. [employer] ID not yet generated.   CSW contacted [place] to extending patient's insurance authorization. CSW informed updated clinicals needed to be faxed in order to start a new authorization. CSW faxed updated clinicals, awaiting to hear back with new authorization number.  CSW contacted [facility] to inquire on weekend discharge, awaiting a call back. CSW updated MD on progress.  Expected Discharge Plan: [place] Barriers to Discharge: Continued Medical Work up  Expected Discharge Plan and Services Expected Discharge Plan: [place] arrangements for the past 2 months: Single Family Home                                       Social Determinants of Health (SDOH) Interventions    Readmission Risk Interventions No flowsheet data found.

## 2020-03-19 NOTE — Progress Notes (Signed)
[facility] for Warfarin  Indication: atrial fibrillation  Allergies  Allergen Reactions  . Tegretol [Carbamazepine] Other (See Comments)    Made patient's feet swell  . Depakote [Divalproex Sodium] Rash  . Dilantin [Phenytoin Sodium Extended] Rash  . Phenobarbital Rash    Patient Measurements: Height: 5\' 6"  (167.6 cm) Weight: 71.4 kg (157 lb 6.5 oz) IBW/kg (Calculated) : 63.8  Vital Signs: Temp: 98.4 F (36.9 C) (06/25 2341) Temp Source: Oral (06/25 2341) BP: 117/62 (06/25 2341) Pulse Rate: 97 (06/25 2341)  Labs: Recent Labs    03/17/20 0338 03/17/20 0338 03/18/20 0508 03/19/20 0506  HGB 8.7*   < > 7.7* 6.8*  HCT 28.7*  --  24.3* 21.5*  PLT 131*  --  142* 142*  LABPROT 24.7*  --  30.9* 37.2*  INR 2.3*  --  3.1* 3.9*  CREATININE 2.69*  --  3.03* 3.22*   < > = values in this interval not displayed.    Estimated Creatinine Clearance: 15.4 mL/min (A) (by C-G formula based on SCr of 3.22 mg/dL (H)).  Assessment: Patient is a 56 yom that presented to the ED with c/o right arm weakness. Patient has a hx of Afib and is on warfarin PTA for this.  Home warfarin regimen is 6 mg on Tuesday, Thursday, Saturday and 3 mg on all other days.    INR is supratherapeutic at 3.9 today. No overt bleeding noted however Hgb has trended down to 6.8 today - 1 unit PRBC to be infused, platelets trending up.   Goal of Therapy:  INR 2-3 Monitor platelets by anticoagulation protocol: Yes   Plan:  Hold warfarin tonight Daily INR Monitor for s/sx of bleeding  Thank you for involving pharmacy in this patient's care.  Renold Genta, PharmD, BCPS Clinical Pharmacist Clinical phone for 03/19/2020 until 3p is [phone] 03/19/2020 7:38 AM  **Pharmacist phone directory can be found on [email or web address] listed under [place]**

## 2020-03-19 NOTE — Progress Notes (Signed)
Subjective: HD 8 Overnight, no acute events reported.  [name] was evaluated at bedside this morning. He is sitting up in chair. Daughter, Andrew [name], is at bedside. He notes not being able to sleep well overnight. Main concern is ongoing sacral pain and not being able to find position of comfort.   Objective:  Vital signs in last 24 hours: Vitals:   03/18/20 0500 03/18/20 0745 03/18/20 1636 03/18/20 2341  BP:  115/69 114/74 117/62  Pulse:  100 80 97  Resp:  19 19 18   Temp:  97.7 F (36.5 C) 97.9 F (36.6 C) 98.4 F (36.9 C)  TempSrc:    Oral  SpO2:  98% 100% 100%  Weight: 71.4 kg     Height:       CBC Latest Ref Rng & Units 03/19/2020 03/18/2020 03/17/2020  WBC 4.0 - 10.5 K/uL 11.8(H) 10.0 7.7  Hemoglobin 13.0 - 17.0 g/dL 6.8(LL) 7.7(L) 8.7(L)  Hematocrit 39 - 52 % 21.5(L) 24.3(L) 28.7(L)  Platelets 150 - 400 K/uL 142(L) 142(L) 131(L)   BMP Latest Ref Rng & Units 03/19/2020 03/18/2020 03/17/2020  Glucose 70 - 99 mg/dL 125(H) 163(H) 107(H)  BUN 8 - 23 mg/dL 61(H) 56(H) 50(H)  Creatinine 0.61 - 1.24 mg/dL 3.22(H) 3.03(H) 2.69(H)  BUN/Creat Ratio 10 - 24 - - -  Sodium 135 - 145 mmol/L 134(L) 134(L) 136  Potassium 3.5 - 5.1 mmol/L 4.4 4.6 4.1  Chloride 98 - 111 mmol/L 89(L) 90(L) 96(L)  CO2 22 - 32 mmol/L 32 33(H) 33(H)  Calcium 8.9 - 10.3 mg/dL 11.4(H) 12.2(H) 12.3(H)    Physical Exam  Constitutional: chronically ill appearing elderly male, sitting in bedside chair; slightly uncomfortable.  HENT:  Normocephalic and atraumatic; edentulous, MMM, EOMI and conjunctivae nl Cardiovascular: irregularly irregular rhythm; tachycardic; S1 and S2 present, no murmurs/rubs/gallops  Respiratory: No respiratory distress, Effort normal and breath sounds normal.  No wheezes or rales on auscultation GI: Soft. Bowel sounds are normal. No distension. There is no tenderness.  Extremities: 2+ pitting edema to bilateral lower extremities Neurological: Is alert and oriented x3, no apparent  focal deficits noted; normal sensorium Skin: Warm and dry, chronic dry skin changes, no erythema or rash noted   Assessment/Plan: [name] is an 86 yr old with PMHx of HFrEF (EF 30-35%), atrial fibrillation on warfarin therapy, advanced renal disease, meningioma, and AAA without rupture admitted for symptomatic hypercalcemia.   Symptomatic hypercalcemia 2/2 hypervitaminosis D vs malnutrition: Calcium improved to 12.2>11.4 this morning following one dose of IV pamidronate. Patient does have ongoing weakness; however, seems to be slightly improving.  Patient continued on torsemide with only 450cc UOP recorded over past 24 hours; however, endorsing frequent urination. Still has 2+ pitting edema to bilateral lower extremities.  - Nephrology consulted, appreciate recommendations - Continue torsemide 40mg  bid - Trend renal function - PTHrP level pending  Chronic back pain:  Patient with ongoing sacral and lower back pain secondary to severe osteoarthritis.  Suspect from his severe osteoarthritis. - Acetaminophen 650mg  q6h prn - IV fentanyl 12.68mcg q3h prn - Lidocaine patch  CKDIV: Anemia of chronic disease: Baseline BUN/Cr 46/2.76. Patient has been on diuretics throughout admission of bilateral lower extremity. Patient with continued bilateral lower extremity edema. Renal function with slightly increased sCr but stable.  Also noted to have Hb of 6.8 with supratherapeutic INR 3.1>3.9. No active signs of bleeding. Suspect secondary to his anemia of chronic disease in setting of advanced renal disease.  - Transfuse 1u pRBC -  F/u post transfusion H/H - Trend renal function - Strict I&O and daily weights - Avoid nephrotoxic agents   Permanent atrial fibrillation: Chronic systolic heart failure: Currently rate controlled with metoprolol and on warfarin dosing per pharmacy for his atrial fibrillation.He does appear hypervolemic on examination this morning with 2+ BLE pitting edema. Patient  continued on torsemide with good urine output - Warfarin per dosing pharmacy - Torsemide 40mg  bid  - Continue metoprolol 12.5mg  bid  FEN/GI: Diet: Soft foods Fluids: None Electrolytes: Monitor and replete prn  DVT Prophylaxis: Warfarin Code status: DNR/DNI  Prior to Admission Living Arrangement: Home Anticipated Discharge Location: SNF Barriers to Discharge: Continued medical management Dispo: Anticipated discharge in approximately 0-1 day(s).   Andrew Heck, MD  Internal Medicine, PGY-1 03/19/2020, 6:41 AM Pager:[phone] After 5pm on weekdays and 1pm on weekends: On Call pager [phone]

## 2020-03-19 NOTE — Progress Notes (Signed)
[facility] Progress Note    Assessment/ Plan:    1.  Hypercalcemia: frontrunner to dx is Ca and vit d supps.  Reviewed med list on dtr's phone- on Phoslo and cholecalciferol.  There is some confusion about meds so wonder if he's on Ca carbonate as well +/- calcitriol.  Holding Ca and Vit D supps.  Will stop IVFs d/t swelling, continue torsemide.  Doing [place] calcitonin x 4 doses.  No evidence of MM, PTH appropriately suppressed, PTHrP pending.  Could consider checking PSA.  CXR without overt masses, had c-scope 2015.  Will do renal US to see if there is an obvious mass too--> none.  Since Ca not getting any better with [place] calcitonin and all the other measures, s/p pamidronate 30 mg IV x 1 6/25.  Will do intranasal calcitonin 2 sprays daily  2.  CKD IV: at [facility]    3.  Chronic systolic CHF: EF 28-41%  4.  Anemia; getting 1 u pRBCs, Aranesp 100 mcg today  5.  Binders-- needs non Ca containing binder, have ordered renvela, no ca or vit D containing products.  4.  Dispo: pending- I see pall care follows him as OP, for SNF  Subjective:    S/p dose of pamidronate, Ca down to 11.6.  Hgb 6.8, getting u pRBCs   Objective:   BP (!) 117/55   Pulse 83   Temp (!) 97.5 F (36.4 C) (Oral)   Resp 17   Ht 5\' 6"  (1.676 m)   Wt 71.4 kg   SpO2 100%   BMI 25.41 kg/m   Intake/Output Summary (Last 24 hours) at 03/19/2020 1325 Last data filed at 03/19/2020 1245 Gross per 24 hour  Intake 1149 ml  Output 450 ml  Net 699 ml   Weight change:   Physical Exam: GEN: NAD, sitting in chair, getting blood transfusion HEENT EOMI PERRL  NECK no JVD PULM crackles improved CV irregular no m/r/g ABD: + soft and nontender midline hernia present EXT 1+ LE edema NEURO AAO x 3 no asterixis  Imaging: US RENAL  Result Date: 03/17/2020 CLINICAL DATA:  Stage III B chronic kidney disease, hypertension, diabetes mellitus EXAM: RENAL / URINARY TRACT ULTRASOUND COMPLETE  COMPARISON:  CT abdomen and pelvis 01/22/2011 FINDINGS: Right Kidney: Renal measurements: 9.7 x 5.4 x 5.6 cm = volume: 154 mL. Marked cortical thinning. Increased cortical echogenicity. No mass, hydronephrosis or shadowing calcification. Left Kidney: Renal measurements: 12.0 x 5.5 x 4.7 cm = volume: 162 mL. Cortical thinning. Increased cortical echogenicity. Mild collecting system dilatation. Complex cystic mass at inferior pole 1.2 x 1.8 x 1.4 cm, containing multiple septations and irregular wall. Bladder: Appears normal for degree of bladder distention. Other: N/A IMPRESSION: Medical renal disease changes and cortical atrophy of both kidneys. Mild LEFT renal collecting system dilatation. Complex cystic lesion at the inferior pole 1.2 x 1.8 x 1.4 cm; this was not seen on the prior CT exam from 2012. Consider MR characterization of this lesion. Electronically Signed   By: Lavonia Dana M.D.   On: 03/17/2020 16:17    Labs: BMET Recent Labs  Lab 03/13/20 3244 03/14/20 0420 03/15/20 0401 03/16/20 0303 03/17/20 0338 03/18/20 0508 03/19/20 0506  NA 139 139 140 137 136 134* 134*  K 4.1 4.5 4.3 4.1 4.1 4.6 4.4  CL 98 96* 96* 94* 96* 90* 89*  CO2 32 35* 35* 31 33* 33* 32  GLUCOSE 217* 154* 114* 126* 107* 163* 125*  BUN  52* 55* 50* 47* 50* 56* 61*  CREATININE 2.84* 2.87* 2.77* 2.66* 2.69* 3.03* 3.22*  CALCIUM 11.6* 12.2* 12.0* 12.4* 12.3* 12.2* 11.4*  PHOS  --  4.7* 4.4 5.0* 5.2* 5.0* 5.6*   CBC Recent Labs  Lab 03/16/20 0303 03/17/20 0338 03/18/20 0508 03/19/20 0506  WBC 8.5 7.7 10.0 11.8*  HGB 9.1* 8.7* 7.7* 6.8*  HCT 30.0* 28.7* 24.3* 21.5*  MCV 96.2 96.6 93.1 91.9  PLT 130* 131* 142* 142*    Medications:    . sodium chloride   Intravenous Once  . atorvastatin  20 mg Oral QHS  . darbepoetin (ARANESP) injection - NON-DIALYSIS  100 mcg Subcutaneous Once  . feeding supplement  1 Container Oral BID BM  . feeding supplement (ENSURE ENLIVE)  237 mL Oral Q24H  . lidocaine  1 patch  Transdermal Q24H  . metoprolol tartrate  12.5 mg Oral Daily  . torsemide  40 mg Oral BID  . Warfarin - Pharmacist Dosing Inpatient   Does not apply q1600  . zonisamide  300 mg Oral BID      Madelon Lips, MD 03/19/2020, 1:25 PM

## 2020-03-20 DIAGNOSIS — M1909 Primary osteoarthritis, other specified site: Secondary | ICD-10-CM

## 2020-03-20 DIAGNOSIS — R000 Tachycardia, unspecified: Secondary | ICD-10-CM

## 2020-03-20 LAB — TYPE AND SCREEN
ABO/RH(D): A POS
Antibody Screen: NEGATIVE
Unit division: 0

## 2020-03-20 LAB — RENAL FUNCTION PANEL
Albumin: 3.1 g/dL — ABNORMAL LOW (ref 3.5–5.0)
Anion gap: 13 (ref 5–15)
BUN: 68 mg/dL — ABNORMAL HIGH (ref 8–23)
CO2: 32 mmol/L (ref 22–32)
Calcium: 11.1 mg/dL — ABNORMAL HIGH (ref 8.9–10.3)
Chloride: 88 mmol/L — ABNORMAL LOW (ref 98–111)
Creatinine, Ser: 3.75 mg/dL — ABNORMAL HIGH (ref 0.61–1.24)
GFR calc Af Amer: 16 mL/min — ABNORMAL LOW (ref >60)
GFR calc non Af Amer: 14 mL/min — ABNORMAL LOW (ref >60)
Glucose, Bld: 96 mg/dL (ref 70–99)
Phosphorus: 5.6 mg/dL — ABNORMAL HIGH (ref 2.5–4.6)
Potassium: 4.6 mmol/L (ref 3.5–5.1)
Sodium: 133 mmol/L — ABNORMAL LOW (ref 135–145)

## 2020-03-20 LAB — BPAM RBC
Blood Product Expiration Date: 202107212359
ISSUE DATE / TIME: 202106260839
Unit Type and Rh: 6200

## 2020-03-20 LAB — CBC
HCT: 24.8 % — ABNORMAL LOW (ref 39.0–52.0)
Hemoglobin: 8 g/dL — ABNORMAL LOW (ref 13.0–17.0)
MCH: 29.1 pg (ref 26.0–34.0)
MCHC: 32.3 g/dL (ref 30.0–36.0)
MCV: 90.2 fL (ref 80.0–100.0)
Platelets: 136 10*3/uL — ABNORMAL LOW (ref 150–400)
RBC: 2.75 MIL/uL — ABNORMAL LOW (ref 4.22–5.81)
RDW: 14.1 % (ref 11.5–15.5)
WBC: 8.6 10*3/uL (ref 4.0–10.5)
nRBC: 0 % (ref 0.0–0.2)

## 2020-03-20 LAB — PROTIME-INR
INR: 3.6 — ABNORMAL HIGH (ref 0.8–1.2)
Prothrombin Time: 34.4 seconds — ABNORMAL HIGH (ref 11.4–15.2)

## 2020-03-20 MED ORDER — TORSEMIDE 20 MG PO TABS
40.0000 mg | ORAL_TABLET | Freq: Every day | ORAL | Status: DC
  Administered 2020-03-20: 40 mg via ORAL
  Filled 2020-03-20: qty 2

## 2020-03-20 NOTE — Progress Notes (Signed)
Orthopedic Tech Progress Note Patient Details:  Andrew [name] Oct 23, 1933 [id number]  Ortho Devices Type of Ortho Device: Louretta [place] boot Ortho Device/Splint Location: BLE Ortho Device/Splint Interventions: Ordered, Application   Post Interventions Patient Tolerated: Well Instructions Provided: Care of device   Torrie Lafavor 03/20/2020, 4:48 PM

## 2020-03-20 NOTE — Progress Notes (Signed)
[facility] Progress Note    Assessment/ Plan:    1.  Hypercalcemia: frontrunner to dx is Ca and vit d supps.  Reviewed med list on dtr's phone- on Phoslo and cholecalciferol.  There is some confusion about meds so wonder if he's on Ca carbonate as well +/- calcitriol.  Holding Ca and Vit D supps.  Will stop IVFs d/t swelling, continue torsemide.  s/p [place] calcitonin x 4 doses.  No evidence of MM, PTH appropriately suppressed, PTHrP pending.  Could consider checking PSA.  CXR without overt masses, had c-scope 2015.  Will do renal US to see if there is an obvious mass too--> none.   s/p pamidronate 30 mg IV x 1 6/25.  intranasal calcitonin 2 sprays daily.  Getting better slowly, Ca 11.1 this AM  2.  [facility] IV: [facility]  Baseline is 2.7-3.0, up to 3.7 today, decrease torsemide to 40 mg daily and wrap legs with Unna boots.    3.  Chronic systolic CHF: EF 02-72%  4.  Anemia; getting 1 u pRBCs, Aranesp 100 mcg 6/26  5.  Binders-- needs non Ca containing binder, have ordered renvela, no ca or vit D containing products.  6.  LE edema: ordered Unna boots today  7.  Dispo: pending- I see pall care follows him as OP, for SNF  Subjective:    Cr up at 3.7 today.  Reporting back pain.     Objective:   BP 106/60 (BP Location: Left Arm)   Pulse 100   Temp 98.6 F (37 C) (Oral)   Resp 17   Ht 5\' 6"  (1.676 m)   Wt 71.4 kg   SpO2 99%   BMI 25.41 kg/m   Intake/Output Summary (Last 24 hours) at 03/20/2020 1209 Last data filed at 03/20/2020 0900 Gross per 24 hour  Intake 555 ml  Output 650 ml  Net -95 ml   Weight change:   Physical Exam: GEN: NAD, sitting in chair, looks like he can't get comfortable HEENT EOMI PERRL  NECK no JVD PULM crackles improved CV irregular no m/r/g ABD: + soft and nontender midline hernia present EXT 1+ LE edema NEURO AAO x 3 no asterixis  Imaging: No results found.  Labs: BMET Recent Labs  Lab 03/14/20 0420 03/15/20 0401  03/16/20 0303 03/17/20 0338 03/18/20 0508 03/19/20 0506 03/20/20 0247  NA 139 140 137 136 134* 134* 133*  K 4.5 4.3 4.1 4.1 4.6 4.4 4.6  CL 96* 96* 94* 96* 90* 89* 88*  CO2 35* 35* 31 33* 33* 32 32  GLUCOSE 154* 114* 126* 107* 163* 125* 96  BUN 55* 50* 47* 50* 56* 61* 68*  CREATININE 2.87* 2.77* 2.66* 2.69* 3.03* 3.22* 3.75*  CALCIUM 12.2* 12.0* 12.4* 12.3* 12.2* 11.4* 11.1*  PHOS 4.7* 4.4 5.0* 5.2* 5.0* 5.6* 5.6*   CBC Recent Labs  Lab 03/17/20 0338 03/17/20 0338 03/18/20 0508 03/19/20 0506 03/19/20 1420 03/20/20 0247  WBC 7.7  --  10.0 11.8*  --  8.6  HGB 8.7*   < > 7.7* 6.8* 8.3* 8.0*  HCT 28.7*   < > 24.3* 21.5* 25.4* 24.8*  MCV 96.6  --  93.1 91.9  --  90.2  PLT 131*  --  142* 142*  --  136*   < > = values in this interval not displayed.    Medications:    . atorvastatin  20 mg Oral QHS  . calcitonin ([place])  2 spray Alternating Nares Daily  .  feeding supplement  1 Container Oral BID BM  . feeding supplement (ENSURE ENLIVE)  237 mL Oral Q24H  . lidocaine  1 patch Transdermal Q24H  . metoprolol tartrate  12.5 mg Oral Daily  . sevelamer carbonate  800 mg Oral TID WC  . torsemide  40 mg Oral Daily  . Warfarin - Pharmacist Dosing Inpatient   Does not apply q1600  . zonisamide  300 mg Oral BID      Madelon Lips, MD 03/20/2020, 12:09 PM

## 2020-03-20 NOTE — Progress Notes (Signed)
[facility] for Warfarin  Indication: atrial fibrillation  Allergies  Allergen Reactions  . Tegretol [Carbamazepine] Other (See Comments)    Made patient's feet swell  . Depakote [Divalproex Sodium] Rash  . Dilantin [Phenytoin Sodium Extended] Rash  . Phenobarbital Rash    Patient Measurements: Height: 5\' 6"  (167.6 cm) Weight: 71.4 kg (157 lb 6.5 oz) IBW/kg (Calculated) : 63.8  Vital Signs: Temp: 98.6 F (37 C) (06/27 0030) Temp Source: Oral (06/27 0030) BP: 106/60 (06/27 0849) Pulse Rate: 100 (06/27 0849)  Labs: Recent Labs    03/18/20 0508 03/18/20 0508 03/19/20 0506 03/19/20 0506 03/19/20 1420 03/20/20 0247  HGB 7.7*   < > 6.8*   < > 8.3* 8.0*  HCT 24.3*   < > 21.5*  --  25.4* 24.8*  PLT 142*  --  142*  --   --  136*  LABPROT 30.9*  --  37.2*  --   --  34.4*  INR 3.1*  --  3.9*  --   --  3.6*  CREATININE 3.03*  --  3.22*  --   --  3.75*   < > = values in this interval not displayed.    Estimated Creatinine Clearance: 13.2 mL/min (A) (by C-G formula based on SCr of 3.75 mg/dL (H)).  Assessment: Patient is a 63 yom that presented to the ED with c/o right arm weakness. Patient has a hx of Afib and is on warfarin PTA for this.  Home warfarin regimen is 6 mg on Tuesday, Thursday, Saturday and 3 mg on all other days.    INR is supratherapeutic at 3.6 today. No overt bleeding noted, Hgb up to 8 s/p 1 unit PRBC 6/26, platelets stable 130-140s.   Goal of Therapy:  INR 2-3 Monitor platelets by anticoagulation protocol: Yes   Plan:  Hold warfarin again tonight Daily INR Monitor for s/sx of bleeding  Thank you for involving pharmacy in this patient's care.  Renold Genta, PharmD, BCPS Clinical Pharmacist Clinical phone for 03/20/2020 until 3p is [phone] 03/20/2020 9:33 AM  **Pharmacist phone directory can be found on [email or web address] listed under [place]**

## 2020-03-20 NOTE — Discharge Summary (Addendum)
Name: Andrew [name] MRN: [id number] DOB: October 20, 1933 86 y.o. PCP: Aldine Contes, MD  Date of Admission: 03/11/2020  4:06 PM Date of Discharge: 03/21/2020 Attending Physician: Lucious Groves, DO  Discharge Diagnosis: 1. Symptomatic hypercalcemia 2/2 hypervitaminosis D 2. Malnutrition 3. Anemia of chronic disease 4. Advanced chronic renal disease 5. Permanent atrial fibrillation on coumadin 6. Chronic systolic heart failure  7. Meningioma   Discharge Medications: Allergies as of 03/21/2020      Reactions   Tegretol [carbamazepine] Other (See Comments)   Made patient's feet swell   Depakote [divalproex Sodium] Rash   Dilantin [phenytoin Sodium Extended] Rash   Phenobarbital Rash      Medication List    STOP taking these medications   CALCIUM 500 PO   cephALEXin 500 MG capsule Commonly known as: Keflex   Vitamin D-3 25 MCG (1000 UT) Caps     TAKE these medications   acetaminophen 325 MG tablet Commonly known as: TYLENOL Take 2 tablets (650 mg total) by mouth every 6 (six) hours as needed for mild pain (or Fever >/= 101).   atorvastatin 40 MG tablet Commonly known as: LIPITOR Take 20 mg by mouth at bedtime.   DARBEPOETIN ALFA IJ Inject 1 each as directed every 30 (thirty) days. Once a month   dorzolamide-timolol 22.3-6.8 MG/ML ophthalmic solution Commonly known as: COSOPT Place 1 drop into both eyes 2 (two) times daily.   fluticasone 50 MCG/ACT nasal spray Commonly known as: Flonase Place 1 spray into both nostrils daily.   hydrocortisone cream 1 % Apply 1 application topically every 4 (four) hours.   latanoprost 0.005 % ophthalmic solution Commonly known as: XALATAN Place 1 drop into both eyes at bedtime.   Lumbar Back Brace/Support Pad Misc 1 each by Does not apply route as needed.   metoprolol tartrate 25 MG tablet Commonly known as: LOPRESSOR Take 0.5 tablets (12.5 mg total) by mouth 2 (two) times daily. What changed:   medication  strength  how much to take  when to take this   One-A-Day Mens 50+ Advantage Tabs Take 1 tablet by mouth daily.   torsemide 20 MG tablet Commonly known as: DEMADEX Take 40 mg by mouth daily.   vitamin B-12 1000 MCG tablet Commonly known as: CYANOCOBALAMIN Take 1,000 mcg by mouth daily.   warfarin 3 MG tablet Commonly known as: COUMADIN Take as directed. If you are unsure how to take this medication, talk to your nurse or doctor. Original instructions: Take 1 tablet (3 mg total) by mouth one time only at 4 PM. What changed:   medication strength  how much to take  how to take this  when to take this  additional instructions   zonisamide 100 MG capsule Commonly known as: ZONEGRAN Take 300 mg by mouth 2 (two) times daily.       Disposition and follow-up:   [name] was discharged from [facility] in Stable condition.  At the hospital follow up visit please address:  1.  Symptomatic hypercalcemia 2/2 hypervitaminosis D: - Discontinue calcium and vitamin D supplements on discharge - F/u with PCP and nephrology  Malnutrition 2/2 dysphagia: - Recommend continuing with high protein feeding supplements: Boost bid and Ensure daily  Anemia of chronic disease: - f/u CBC - May need Aranesp dosing per nephrology   Advanced chronic renal failure: - F/u renal function panel  Permanent atrial fibrillation - On chronic warfarin therapy, goal INR 2-3 - Please dose warfarin for therapeutic  INR   Chronic systolic heart failure: EF 30-35% - on torsemide 40mg  daily and metoprolol 12.5mg  bid, please adjust diuretic dosing as needed  - compression wrappings for lower extremity edema  Meningioma: - 18 mm meningioma overlying the anterior left frontal convexity without associated mass effect on MRI - Continue to monitor, no further intervention recommended at this time  2.  Labs / imaging needed at time of follow-up: CBC, Renal function panel,  INR  3.  Pending labs/ test needing follow-up: PTH-rP  Follow-up Appointments:  Contact information for follow-up providers    Aldine Contes, MD Follow up.   Specialty: Internal Medicine Contact information: [address], [address] [place] [address] [phone]        Jerline Pain, MD .   Specialty: Cardiology Contact information: [address] [place] [address] [phone]        Madelon Lips, MD Follow up.   Specialty: Nephrology Contact information: [place] [address] [phone]            Contact information for after-discharge care    Destination    [facility] Preferred SNF .   Service: Skilled Nursing Contact information: [place] Hospital Course by problem list: 1. Symptomatic hypercalcemia 2/2 hypervitaminosis D Patient presented with generalized weakness and slurred speech concerning for stroke. CT Head and MRI Brain without acute findings. However, patient noted to be hypercalcemic on labs which likely contributed to symptoms. Patient recently started on calcium supplements and vitamin D supplements. Noted to have elevated vitamin D 1,25-OH levels with normal vitamin D 25-OH levels. PTH appropriately low. Patient had persistent hypercalcemia despite holding his supplementation, fluid and diuretics and calcitonin treatments. Nephrology was consulted and patient given one dose of IV palmidronate with improvement in calcium levels. Patient with improving symptoms.  2. Malnutrition 2/2 dysphagia Patient with persistent dysphagia for several years. Recommended for barium swallow study as outpatient. He appears chronically ill appearing and cachectic. Per daughter, has poor PO intake at baseline. SLP consulted and patient had modified barium swallow that demonstrated large bony protrusion on cervical spine  at C4/5 that impedes epiglottic deflection. Pt is able to tolerate thin liquids with mutiple hyoid bursts and sustained laryngeal closure with mild pyriform residue. Patient recommended for dysphagia 1 diet. However, due to family request and patient preference, given soft food diet due to worsening PO intake. Patient also started on Boost and Ensure supplementation.   3. Anemia of chronic disease  Advanced chronic renal failure Baseline BUN/Cr 46/2.76 with baseline Hb ~9-10. Patient on diuretics throughout admission for bilateral lower extremity edema and as part of treatment for his hypercalcemia. Renal function on discharge with slightly increased BUN/sCr to 68/3.75; however, continued to have good urine output. Patient recommended to continue torsemide 40mg  daily.  Patient also noted to have worsening anemia during admission requiring 1u pRBC with improvement. No active bleeding noted. Suspect in setting of chronic anemia. Hb on discharge 7.2. Recommend f/u CBC. Will likely need iron studies as well.   4. Permanent atrial fibrillation on coumadin therapy. Goal INR 2-3. INR initially supratherapeutic on presentation. INR 2.9 on discharge. Recommend f/u INR. He is continued on metoprolol 12.5mg  bid for rate control.   5. Chronic systolic heart failure EF 30-35%. Patient is hypervolemic on examination with significant bilateral lower extremity edema.  Managed with torsemide 40mg  bid during hospitalization; however, due to renal function, this is decreased to daily dosing. Recommend compression wrapping on discharge and continued diuresis.     Discharge Vitals:   BP (!) 101/58 (BP Location: Left Arm)   Pulse (!) 105   Temp 97.8 F (36.6 C) (Oral)   Resp 16   Ht 5\' 6"  (1.676 m)   Wt 71.4 kg   SpO2 98%   BMI 25.41 kg/m   Pertinent Labs, Studies, and Procedures:  CBC Latest Ref Rng & Units 03/21/2020 03/20/2020 03/19/2020  WBC 4.0 - 10.5 K/uL 10.9(H) 8.6 -  Hemoglobin 13.0 - 17.0 g/dL 7.2(L)  8.0(L) 8.3(L)  Hematocrit 39 - 52 % 22.5(L) 24.8(L) 25.4(L)  Platelets 150 - 400 K/uL 134(L) 136(L) -   Component     Latest Ref Rng & Units 03/12/2020 03/13/2020 03/14/2020 03/15/2020             Sodium     135 - 145 mmol/L 142 139 139 140  Potassium     3.5 - 5.1 mmol/L 4.6 4.1 4.5 4.3  Chloride     98 - 111 mmol/L 102 98 96 (L) 96 (L)  CO2     22 - 32 mmol/L 33 (H) 32 35 (H) 35 (H)  Glucose     70 - 99 mg/dL 141 (H) 217 (H) 154 (H) 114 (H)  BUN     8 - 23 mg/dL 47 (H) 52 (H) 55 (H) 50 (H)  Creatinine     0.61 - 1.24 mg/dL 2.64 (H) 2.84 (H) 2.87 (H) 2.77 (H)  Calcium     8.9 - 10.3 mg/dL 12.2 (H) 11.6 (H) 12.2 (H) 12.0 (H)  Phosphorus     2.5 - 4.6 mg/dL   4.7 (H) 4.4  Albumin     3.5 - 5.0 g/dL   3.0 (L) 3.1 (L)  GFR, Est Non African American     >60 mL/min 21 (L) 19 (L) 19 (L) 20 (L)  GFR, Est African American     >60 mL/min 25 (L) 23 (L) 22 (L) 23 (L)  Anion gap     5 - 15 7 9 8 9    Component     Latest Ref Rng & Units 03/16/2020 03/17/2020 03/18/2020 03/19/2020             Sodium     135 - 145 mmol/L 137 136 134 (L) 134 (L)  Potassium     3.5 - 5.1 mmol/L 4.1 4.1 4.6 4.4  Chloride     98 - 111 mmol/L 94 (L) 96 (L) 90 (L) 89 (L)  CO2     22 - 32 mmol/L 31 33 (H) 33 (H) 32  Glucose     70 - 99 mg/dL 126 (H) 107 (H) 163 (H) 125 (H)  BUN     8 - 23 mg/dL 47 (H) 50 (H) 56 (H) 61 (H)  Creatinine     0.61 - 1.24 mg/dL 2.66 (H) 2.69 (H) 3.03 (H) 3.22 (H)  Calcium     8.9 - 10.3 mg/dL 12.4 (H) 12.3 (H) 12.2 (H) 11.4 (H)  Phosphorus     2.5 - 4.6 mg/dL 5.0 (H) 5.2 (H) 5.0 (H) 5.6 (H)  Albumin     3.5 - 5.0 g/dL 3.0 (L) 3.0 (L) 3.1 (L) 3.1 (L)  GFR, Est Non African American     >60 mL/min 21 (L) 21 (L) 18 (L) 17 (L)  GFR, Est African  American     >60 mL/min 24 (L) 24 (L) 21 (L) 19 (L)  Anion gap     5 - 15 12 7 11 13    Component     Latest Ref Rng & Units 03/20/2020          Sodium     135 - 145 mmol/L 133 (L)  Potassium     3.5 - 5.1 mmol/L 4.6  Chloride      98 - 111 mmol/L 88 (L)  CO2     22 - 32 mmol/L 32  Glucose     70 - 99 mg/dL 96  BUN     8 - 23 mg/dL 68 (H)  Creatinine     0.61 - 1.24 mg/dL 3.75 (H)  Calcium     8.9 - 10.3 mg/dL 11.1 (H)  Phosphorus     2.5 - 4.6 mg/dL 5.6 (H)  Albumin     3.5 - 5.0 g/dL 3.1 (L)  GFR, Est Non African American     >60 mL/min 14 (L)  GFR, Est African American     >60 mL/min 16 (L)  Anion gap     5 - 15 13   Component     Latest Ref Rng & Units 03/21/2020          Sodium     135 - 145 mmol/L 132 (L)  Potassium     3.5 - 5.1 mmol/L 4.7  Chloride     98 - 111 mmol/L 88 (L)  CO2     22 - 32 mmol/L 32  Glucose     70 - 99 mg/dL 137 (H)  BUN     8 - 23 mg/dL 88 (H)  Creatinine     0.61 - 1.24 mg/dL 4.30 (H)  Calcium     8.9 - 10.3 mg/dL 11.6 (H)  Phosphorus     2.5 - 4.6 mg/dL 6.3 (H)  Albumin     3.5 - 5.0 g/dL 3.0 (L)  GFR, Est Non African American     >60 mL/min 12 (L)  GFR, Est African American     >60 mL/min 14 (L)  Anion gap     5 - 15 12   PTH 15 - 65 pg/mL 8Low    Total Protein ELP 6.0 - 8.5 g/dL 5.8Low     Albumin ELP 2.9 - 4.4 g/dL 3.3     Alpha-1-Globulin 0.0 - 0.4 g/dL 0.3     Alpha-2-Globulin 0.4 - 1.0 g/dL 0.6     Beta Globulin 0.7 - 1.3 g/dL 0.7     Gamma Globulin 0.4 - 1.8 g/dL 0.8     M-Spike, % Not Observed g/dL Not Observed     SPE Interp.  Comment     Comment: (NOTE)  The SPE pattern appears unremarkable. Evidence of monoclonal  protein is not apparent.  Performed At: [facility]  [place], [place] [id number]  Rush Farmer MD GM:[id number]   Comment  Comment     Comment: (NOTE)  Protein electrophoresis scan will follow via computer, mail, or  courier delivery.   Globulin, Total 2.2 - 3.9 g/dL 2.5 VC  1.8 R  2.1 R   A/G Ratio 0.7 - 1.7 1.3 VC      Kappa free light chain 3.3 - 19.4 mg/L 64.5High   Lamda free light chains 5.7 - 26.3 mg/L 38.0High   Kappa, lamda light chain ratio 0.26 - 1.65 1.70High  Vit D, 1,25-Dihydroxy 19.9 - 79.3 pg/mL 111.0High    Vit D, 25-Hydroxy 30 - 100 ng/mL 76.09    TSH 0.350 - 4.500 uIU/mL 5.826High    CT HEAD WO CONTRAST 03/11/2020: IMPRESSION: 1. Generalized cerebral atrophy. 2. No acute intracranial abnormality. 3. Findings consistent with a meningioma within the left frontal lobe. MRI correlation is recommended.  MR BRAIN WO CONTRAST 03/11/2020: IMPRESSION: 1. Technically limited exam due to patient's inability to tolerate the full length of the exam and motion artifact. 2. No acute intracranial abnormality identified. 3. Remote lacunar infarcts involving the left basal ganglia and left thalamus. 4. 18 mm meningioma overlying the anterior left frontal convexity without associated mass effect. 5. Underlying mild chronic microvascular ischemic disease.  CXR 03/14/2020: IMPRESSION: Retained contrast in the esophagus. Bronchitic changes with bibasilar opacities question atelectasis versus infiltrate.  MODIFIED BARIUM SWALLOW 03/17/2020: Clinical Impression Fluoroscopy reveals large bony protrusion on cervical spine at C4/5 that impedes epiglottic deflection. Pt is able to tolerate thin liquids with mutiple hyoid bursts and sustained laryngeal closure with mild pyriform residue. There were instances of trace frank penetration of residue post swallow. More concerning was the severe vallecular residue with puree and solids. Unfortunately a head turn right or left did not fully divert epiglottis and did not aid in bolus transit. Only mutiple hard liquid washes cleared 75% of residue. Pt is at high risk of poor nutritional support.   RENAL US 03/17/2020: IMPRESSION: Medical renal disease changes and cortical atrophy of both kidneys. Mild LEFT renal collecting system dilatation. Complex cystic lesion at the inferior pole 1.2 x 1.8 x 1.4 cm; this was not seen on the prior CT exam from 2012. Consider MR characterization of this lesion.  Discharge  Instructions: Discharge Instructions    Call MD for:  difficulty breathing, headache or visual disturbances   Complete by: As directed    Call MD for:  extreme fatigue   Complete by: As directed    Call MD for:  hives   Complete by: As directed    Call MD for:  persistant dizziness or light-headedness   Complete by: As directed    Call MD for:  persistant nausea and vomiting   Complete by: As directed    Call MD for:  severe uncontrolled pain   Complete by: As directed    Call MD for:  temperature >100.4   Complete by: As directed    Diet - low sodium heart healthy   Complete by: As directed    Discharge instructions   Complete by: As directed    Mr. Jousha, Schwandt were admitted to the hospital with high calcium levels causing weakness. You were treated with IV fluids, diuresis and calcium-lowering agents. To assist with your weakness, you are being discharged to a rehab facility to help with further physical therapy.   Recommendations on discharge: Please discontinue your vitamin D and calcium supplements. You may continue to take Renvela. Please follow up with your PCP and nephrologist for monitoring of your renal function and adjustment of any medications. Please continue to take Boost and/or Ensure to help with nutritional supplementation.   Thank you!   Increase activity slowly   Complete by: As directed    No wound care   Complete by: As directed       Signed: [name], MD 03/21/2020, 12:46 PM   Pager: [phone]

## 2020-03-20 NOTE — Progress Notes (Signed)
Subjective: HD 9 Overnight, no acute events reported.  [name] was evaluated at bedside this morning. He is sitting up in chair. Daughter, Andrew [name], is at bedside. She mentions that overnight he had worsening knee pain. She also states he has had intermittent episodes of confusion with moments where he was 'asking for a screwdiver.' She mentions that he got a different bedside chair and he has had improvement in his 'tailbone' pain.  Objective:  Vital signs in last 24 hours: Vitals:   03/19/20 1221 03/19/20 1245 03/19/20 1656 03/20/20 0030  BP: (!) 118/59 (!) 117/55 113/71 118/77  Pulse: 93 83 100   Resp: 18 17 17 16   Temp: 98.3 F (36.8 C) (!) 97.5 F (36.4 C) 98.2 F (36.8 C) 98.6 F (37 C)  TempSrc:  Oral  Oral  SpO2: 100% 100% 100% 97%  Weight:      Height:       CBC Latest Ref Rng & Units 03/20/2020 03/19/2020 03/19/2020  WBC 4.0 - 10.5 K/uL 8.6 - 11.8(H)  Hemoglobin 13.0 - 17.0 g/dL 8.0(L) 8.3(L) 6.8(LL)  Hematocrit 39 - 52 % 24.8(L) 25.4(L) 21.5(L)  Platelets 150 - 400 K/uL 136(L) - 142(L)   BMP Latest Ref Rng & Units 03/20/2020 03/19/2020 03/18/2020  Glucose 70 - 99 mg/dL 96 125(H) 163(H)  BUN 8 - 23 mg/dL 68(H) 61(H) 56(H)  Creatinine 0.61 - 1.24 mg/dL 3.75(H) 3.22(H) 3.03(H)  BUN/Creat Ratio 10 - 24 - - -  Sodium 135 - 145 mmol/L 133(L) 134(L) 134(L)  Potassium 3.5 - 5.1 mmol/L 4.6 4.4 4.6  Chloride 98 - 111 mmol/L 88(L) 89(L) 90(L)  CO2 22 - 32 mmol/L 32 32 33(H)  Calcium 8.9 - 10.3 mg/dL 11.1(H) 11.4(H) 12.2(H)    Physical Exam  Constitutional: chronically ill appearing elderly male, sitting in bedside chair; appears comfortable  HENT:  Normocephalic and atraumatic; edentulous, MMM, EOMI and conjunctivae nl Cardiovascular: irregularly irregular rhythm; tachycardic; S1 and S2 present, no murmurs/rubs/gallops  Respiratory: No respiratory distress, Effort normal and breath sounds normal.  No wheezes or rales on auscultation GI: Soft. Bowel sounds are  normal. No distension. There is no tenderness.  Extremities: 2+ pitting edema to bilateral lower extremities, bilateral knees without erythema, tenderness or warmth  Neurological: Is alert and oriented x3, no apparent focal deficits noted; normal sensorium Skin: Warm and dry, chronic dry skin changes, no erythema or rash noted   Assessment/Plan: [name] is an 86 yr old with PMHx of HFrEF (EF 30-35%), atrial fibrillation on warfarin therapy, advanced renal disease, meningioma, and AAA without rupture admitted for symptomatic hypercalcemia.   Symptomatic hypercalcemia 2/2 hypervitaminosis D vs malnutrition: Calcium improved to 12.2>11.4>11.1 this morning s/p one dose of IV pamidronate on 6/25. Patient does have ongoing weakness; however, seems to be slightly improving.  Patient continued on torsemide with only 650cc UOP recorded over past 24 hours; however, endorsing frequent urination. Still has 2+ pitting edema to bilateral lower extremities.  - Nephrology consulted, appreciate recommendations - Continue torsemide 40mg  bid - Trend renal function - Compression stockings  - PTHrP level pending  Chronic back pain:  Patient with ongoing sacral and lower back pain secondary to severe osteoarthritis. Currently controlled.  - Acetaminophen 650mg  q6h prn - IV fentanyl 12.47mcg q3h prn - Lidocaine patch  CKDIV: Anemia of chronic disease: Baseline BUN/Cr 46/2.76. Patient has been on diuretics throughout admission of bilateral lower extremity. Patient with continued bilateral lower extremity edema. Renal function with slightly increased sCr but  stable.  Hb improved to 8.0 s/p 1u pRBC on 6/27. No active bleeding noted but did have supratherapeutic INR for which warfarin dosing adjusted  - Trend renal function - Strict I&O and daily weights - Avoid nephrotoxic agents   Permanent atrial fibrillation: Chronic systolic heart failure: Currently rate controlled with metoprolol and on warfarin  dosing per pharmacy for his atrial fibrillation.He does appear hypervolemic on examination this morning with 2+ BLE pitting edema. Patient continued on torsemide with good urine output - Warfarin per dosing pharmacy - Torsemide 40mg  bid  - Continue metoprolol 12.5mg  bid  FEN/GI: Diet: Soft foods Fluids: None Electrolytes: Monitor and replete prn  DVT Prophylaxis: Warfarin Code status: DNR/DNI  Prior to Admission Living Arrangement: Home Anticipated Discharge Location: SNF Barriers to Discharge: Pending insurance auth and SNF placement  Dispo: Anticipated discharge in approximately 0-1 day(s).   Harvie Heck, MD  Internal Medicine, PGY-1 03/20/2020, 6:47 AM Pager:[phone] After 5pm on weekdays and 1pm on weekends: On Call pager [phone]

## 2020-03-20 NOTE — TOC Progression Note (Addendum)
Transition of Care [facility]) - Progression Note    Patient Details  Name: Andrew [name] MRN: [id number] Date of Birth: 02/04/1934  Transition of Care [facility] CM/SW [place], [place] Phone Number: 03/20/2020, 2:57 PM  Clinical Narrative:     CSW requested covid from MD. Spoke with April of [facility], confirmed discharge for Monday. Updated MD.    Expected Discharge Plan: Skilled Nursing Facility Barriers to Discharge: Continued Medical Work up  Expected Discharge Plan and Services Expected Discharge Plan: [place] arrangements for the past 2 months: Single Family Home                                       Social Determinants of Health (SDOH) Interventions    Readmission Risk Interventions No flowsheet data found.

## 2020-03-21 ENCOUNTER — Ambulatory Visit: Payer: No Typology Code available for payment source

## 2020-03-21 DIAGNOSIS — D329 Benign neoplasm of meninges, unspecified: Secondary | ICD-10-CM | POA: Diagnosis not present

## 2020-03-21 DIAGNOSIS — J189 Pneumonia, unspecified organism: Secondary | ICD-10-CM | POA: Diagnosis not present

## 2020-03-21 DIAGNOSIS — R0602 Shortness of breath: Secondary | ICD-10-CM | POA: Diagnosis not present

## 2020-03-21 DIAGNOSIS — M25552 Pain in left hip: Secondary | ICD-10-CM | POA: Diagnosis not present

## 2020-03-21 DIAGNOSIS — I509 Heart failure, unspecified: Secondary | ICD-10-CM | POA: Diagnosis not present

## 2020-03-21 DIAGNOSIS — R2681 Unsteadiness on feet: Secondary | ICD-10-CM | POA: Diagnosis not present

## 2020-03-21 DIAGNOSIS — I5022 Chronic systolic (congestive) heart failure: Secondary | ICD-10-CM | POA: Diagnosis not present

## 2020-03-21 DIAGNOSIS — M2550 Pain in unspecified joint: Secondary | ICD-10-CM | POA: Diagnosis not present

## 2020-03-21 DIAGNOSIS — N183 Chronic kidney disease, stage 3 unspecified: Secondary | ICD-10-CM | POA: Diagnosis not present

## 2020-03-21 DIAGNOSIS — Z7401 Bed confinement status: Secondary | ICD-10-CM | POA: Diagnosis not present

## 2020-03-21 DIAGNOSIS — D649 Anemia, unspecified: Secondary | ICD-10-CM | POA: Diagnosis not present

## 2020-03-21 DIAGNOSIS — I499 Cardiac arrhythmia, unspecified: Secondary | ICD-10-CM | POA: Diagnosis not present

## 2020-03-21 DIAGNOSIS — R1312 Dysphagia, oropharyngeal phase: Secondary | ICD-10-CM | POA: Diagnosis not present

## 2020-03-21 DIAGNOSIS — D638 Anemia in other chronic diseases classified elsewhere: Secondary | ICD-10-CM | POA: Diagnosis not present

## 2020-03-21 DIAGNOSIS — N185 Chronic kidney disease, stage 5: Secondary | ICD-10-CM | POA: Diagnosis not present

## 2020-03-21 DIAGNOSIS — E673 Hypervitaminosis D: Secondary | ICD-10-CM | POA: Diagnosis not present

## 2020-03-21 DIAGNOSIS — L03317 Cellulitis of buttock: Secondary | ICD-10-CM | POA: Diagnosis not present

## 2020-03-21 DIAGNOSIS — R5381 Other malaise: Secondary | ICD-10-CM | POA: Diagnosis not present

## 2020-03-21 DIAGNOSIS — R1310 Dysphagia, unspecified: Secondary | ICD-10-CM | POA: Diagnosis not present

## 2020-03-21 DIAGNOSIS — I4821 Permanent atrial fibrillation: Secondary | ICD-10-CM | POA: Diagnosis not present

## 2020-03-21 DIAGNOSIS — R531 Weakness: Secondary | ICD-10-CM | POA: Diagnosis not present

## 2020-03-21 DIAGNOSIS — E43 Unspecified severe protein-calorie malnutrition: Secondary | ICD-10-CM | POA: Diagnosis not present

## 2020-03-21 DIAGNOSIS — Z7901 Long term (current) use of anticoagulants: Secondary | ICD-10-CM | POA: Diagnosis not present

## 2020-03-21 DIAGNOSIS — I4891 Unspecified atrial fibrillation: Secondary | ICD-10-CM | POA: Diagnosis not present

## 2020-03-21 DIAGNOSIS — M6281 Muscle weakness (generalized): Secondary | ICD-10-CM | POA: Diagnosis not present

## 2020-03-21 LAB — CBC
HCT: 22.5 % — ABNORMAL LOW (ref 39.0–52.0)
Hemoglobin: 7.2 g/dL — ABNORMAL LOW (ref 13.0–17.0)
MCH: 29.3 pg (ref 26.0–34.0)
MCHC: 32 g/dL (ref 30.0–36.0)
MCV: 91.5 fL (ref 80.0–100.0)
Platelets: 134 10*3/uL — ABNORMAL LOW (ref 150–400)
RBC: 2.46 MIL/uL — ABNORMAL LOW (ref 4.22–5.81)
RDW: 14 % (ref 11.5–15.5)
WBC: 10.9 10*3/uL — ABNORMAL HIGH (ref 4.0–10.5)
nRBC: 0 % (ref 0.0–0.2)

## 2020-03-21 LAB — RENAL FUNCTION PANEL
Albumin: 3 g/dL — ABNORMAL LOW (ref 3.5–5.0)
Anion gap: 12 (ref 5–15)
BUN: 88 mg/dL — ABNORMAL HIGH (ref 8–23)
CO2: 32 mmol/L (ref 22–32)
Calcium: 11.6 mg/dL — ABNORMAL HIGH (ref 8.9–10.3)
Chloride: 88 mmol/L — ABNORMAL LOW (ref 98–111)
Creatinine, Ser: 4.3 mg/dL — ABNORMAL HIGH (ref 0.61–1.24)
GFR calc Af Amer: 14 mL/min — ABNORMAL LOW (ref >60)
GFR calc non Af Amer: 12 mL/min — ABNORMAL LOW (ref >60)
Glucose, Bld: 137 mg/dL — ABNORMAL HIGH (ref 70–99)
Phosphorus: 6.3 mg/dL — ABNORMAL HIGH (ref 2.5–4.6)
Potassium: 4.7 mmol/L (ref 3.5–5.1)
Sodium: 132 mmol/L — ABNORMAL LOW (ref 135–145)

## 2020-03-21 LAB — PROTIME-INR
INR: 2.9 — ABNORMAL HIGH (ref 0.8–1.2)
Prothrombin Time: 29.4 seconds — ABNORMAL HIGH (ref 11.4–15.2)

## 2020-03-21 LAB — SARS CORONAVIRUS 2 (TAT 6-24 HRS): SARS Coronavirus 2: NEGATIVE

## 2020-03-21 MED ORDER — DORZOLAMIDE HCL-TIMOLOL MAL 2-0.5 % OP SOLN: 1.0000 [drp] | Freq: Two times a day (BID) | OPHTHALMIC | 12 refills | Status: AC

## 2020-03-21 MED ORDER — LATANOPROST 0.005 % OP SOLN
1.0000 [drp] | Freq: Every day | OPHTHALMIC | Status: DC
  Filled 2020-03-21: qty 2.5

## 2020-03-21 MED ORDER — DORZOLAMIDE HCL-TIMOLOL MAL 2-0.5 % OP SOLN
1.0000 [drp] | Freq: Two times a day (BID) | OPHTHALMIC | Status: DC
  Filled 2020-03-21: qty 10

## 2020-03-21 MED ORDER — WARFARIN SODIUM 3 MG PO TABS: 3.0000 mg | ORAL_TABLET | Freq: Once | ORAL | Status: DC

## 2020-03-21 MED ORDER — WARFARIN SODIUM 3 MG PO TABS: 3.0000 mg | ORAL_TABLET | Freq: Once | ORAL | Status: AC

## 2020-03-21 MED ORDER — METOPROLOL TARTRATE 25 MG PO TABS: 12.5000 mg | ORAL_TABLET | Freq: Two times a day (BID) | ORAL | 0 refills | Status: AC

## 2020-03-21 MED ORDER — LATANOPROST 0.005 % OP SOLN: 1.0000 [drp] | Freq: Every day | OPHTHALMIC | 12 refills | Status: AC

## 2020-03-21 NOTE — Progress Notes (Signed)
Called report to [place] at [facility].

## 2020-03-21 NOTE — Progress Notes (Signed)
Subjective: HD 10 Overnight, no acute events reported.  Andrew [name] was evaluated at bedside this morning. He is sitting up in chair. Daughter, Maston Wight, is at bedside. He is somnolent but arouses to voice and answering questions appropriately. Daughter notes patient's appetite is improved this morning. Discussed discharge planning to rehab. She is concerned regarding worsening renal function. Discussed that it is delicate balance given his hypervolemia and diuresis. However, holding diuretic today in setting of worsening renal failure. Discussed necessity for close follow up as outpatient. Daughter expresses understanding and is agreeable with the plan. She expresses concern about patient not receiving his eye drops during hospitalization and would like to ensure that they are continued at the nursing facility.   Objective:  Vital signs in last 24 hours: Vitals:   03/20/20 0030 03/20/20 0849 03/21/20 0103 03/21/20 0806  BP: 118/77 106/60 112/66 (!) 101/58  Pulse:  100 89 (!) 105  Resp: 16 17 18 16   Temp: 98.6 F (37 C)  98.9 F (37.2 C) 97.8 F (36.6 C)  TempSrc: Oral  Oral Oral  SpO2: 97% 99% 98%   Weight:      Height:       CBC Latest Ref Rng & Units 03/21/2020 03/20/2020 03/19/2020  WBC 4.0 - 10.5 K/uL 10.9(H) 8.6 -  Hemoglobin 13.0 - 17.0 g/dL 7.2(L) 8.0(L) 8.3(L)  Hematocrit 39 - 52 % 22.5(L) 24.8(L) 25.4(L)  Platelets 150 - 400 K/uL 134(L) 136(L) -   BMP Latest Ref Rng & Units 03/21/2020 03/20/2020 03/19/2020  Glucose 70 - 99 mg/dL 137(H) 96 125(H)  BUN 8 - 23 mg/dL 88(H) 68(H) 61(H)  Creatinine 0.61 - 1.24 mg/dL 4.30(H) 3.75(H) 3.22(H)  BUN/Creat Ratio 10 - 24 - - -  Sodium 135 - 145 mmol/L 132(L) 133(L) 134(L)  Potassium 3.5 - 5.1 mmol/L 4.7 4.6 4.4  Chloride 98 - 111 mmol/L 88(L) 88(L) 89(L)  CO2 22 - 32 mmol/L 32 32 32  Calcium 8.9 - 10.3 mg/dL 11.6(H) 11.1(H) 11.4(H)    Physical Exam  Constitutional: chronically ill appearing elderly male, sitting in bedside  chair; somnolent but easily arousable  HENT:  Normocephalic and atraumatic; edentulous, MMM, EOMI and conjunctivae nl Cardiovascular: irregularly irregular rhythm; tachycardic; S1 and S2 present, no murmurs/rubs/gallops  Respiratory: No respiratory distress, Effort normal and breath sounds normal.  No wheezes or rales on auscultation GI: Soft. Bowel sounds are normal. No distension. There is no tenderness.  Extremities: bilateral lower extremities with Unna wrapping Neurological: Is alert and oriented x3, no apparent focal deficits noted; normal sensorium Skin: Warm and dry, chronic dry skin changes, no erythema or rash noted   Assessment/Plan: Mr. Deaire Mcwhirter is an 86 yr old with PMHx of HFrEF (EF 30-35%), atrial fibrillation on warfarin therapy, advanced renal disease, meningioma, and AAA without rupture admitted for symptomatic hypercalcemia.   Symptomatic hypercalcemia 2/2 hypervitaminosis D vs malnutrition: Calcium levels remain <12 this morning s/p one dose of IV pamidronate on 6/25. He has ongoing weakness but continues to improve with physical therapy. Does have improving oral intake. However, significant deconditioning for which recommended for rehab.  Patient to be discharged with non-calcium containing phosphate binders. Will continue to hold the vitamin D and calcium supplements on discharge.  - Calcitonin nasal spray  - Continue Renvela on discharge - No vitamin D or calcium supplements on discharge  - PTHrP level pending  CKDIV: Anemia of chronic disease: Baseline BUN/Cr 46/2.76. Patient has been on diuretics throughout admission of bilateral lower extremity.  Patient with adequate urine output (net -730cc) during admission with diuresis; however worsening sCr.  Hb improved to 7.2 this morning; baseline ~9-10. No active bleeding noted during exam. Did receive 1u pRBC and Aranesp on 6/26.  - Trend renal function - Hold torsemide today  - Trend CBC  - Strict I&O and daily  weights - Avoid nephrotoxic agents   Permanent atrial fibrillation: Chronic systolic heart failure: Currently rate controlled with metoprolol and on warfarin dosing per pharmacy for his atrial fibrillation.He does appear hypervolemic on examination but has Unna wraps in place for bilateral lower extremity edema. Holding diuresis today for worsening renal function - Warfarin per dosing pharmacy - Continue metoprolol 12.5mg  bid  FEN/GI: Diet: Soft foods Fluids: None Electrolytes: Monitor and replete prn  DVT Prophylaxis: Warfarin Code status: DNR/DNI  Prior to Admission Living Arrangement: Home Anticipated Discharge Location: SNF Barriers to Discharge: None Dispo: Anticipated discharge today.  Harvie Heck, MD  Internal Medicine, PGY-1 03/21/2020, 9:56 AM Pager:[phone] After 5pm on weekdays and 1pm on weekends: On Call pager [phone]

## 2020-03-21 NOTE — TOC Transition Note (Signed)
Transition of Care [facility]) - CM/SW Discharge Note   Patient Details  Name: BRANNEN KOPPEN MRN: [id number] Date of Birth: [birth year]  [facility] of [facility]) CM/SW Contact:  Emeterio Reeve, [place] Phone Number: 03/21/2020, 12:29 PM   Clinical Narrative:     Pt is being discharged to [facility]. Pt will be transported by ptar. Pts daughter was in room and notified of transfer.   Nurse to call report to [phone].  Final next level of care: Skilled Nursing Facility Barriers to Discharge: Barriers Resolved   Patient Goals and CMS Choice Patient states their goals for this hospitalization and ongoing recovery are:: [facility] CMS Medicare.gov Compare Post Acute Care list provided to:: Patient Choice offered to / list presented to : Patient  Discharge Placement              Patient chooses bed at: [place] Patient to be transferred to facility by: [facility] Name of family member notified: daughter in room Patient and family notified of of transfer: 03/21/20  Discharge Plan and Services                                     Social Determinants of Health ([place]) Interventions     Readmission Risk Interventions No flowsheet data found.  Emeterio Reeve, Latanya Presser, [place] Social Worker [phone]

## 2020-03-21 NOTE — Progress Notes (Signed)
[facility] Progress Note    Assessment/ Plan:    1.  Hypercalcemia: frontrunner to dx is Ca and vit d supps.  Reviewed med list on dtr's phone- on Phoslo and cholecalciferol.  There is some confusion about meds so wonder if he's on Ca carbonate as well +/- calcitriol.  Holding Ca and Vit D supps.  IV fluids stopped due to swelling and s/p torsemide. s/p [place] calcitonin x 4 doses.  No evidence of MM, PTH appropriately suppressed.  CXR without overt masses, had c-scope 2015. Renal US without mass.  s/p pamidronate 30 mg IV x 1 6/25.   - has been on intranasal calcitonin 2 sprays daily - reassess on 6/29 if here.  Would not continue on discharge  - Would hold torsemide today and resume on 6/29 if he is discharged   - Follow-up PTHrP - pending   - Could consider checking PSA per primary    2. AKI  - Cr has continued to rise - pre-renal with diuretics and hypercalcemia  - hold torsemide today  3.  [facility]D IV: [facility]  Baseline is 2.7-3.0.  Torsemide was titrated due to AKI as above   4.  Chronic systolic CHF: EF 06-26%. Torsemide as above.  Resume torsemide on 6/29.   5.  Anemia;  s/p Aranesp 100 mcg 6/26; has required PRBC's here - continue with PRBC's to maintain Hb of at least 7  6.  Binders-- needs non Ca containing binder so is on renvela now, no ca or vit D containing products.    7.  LE edema: Unna boots were ordered   8.  Dispo: pending-  pall care follows him as outpatient - sees monthly and checks vitals per daughter otherwise no other services at this time; noted planned for SNF when medically appropriate.  Disposition per primary team based on goals.  Subjective:    torsemide was reduced to 40 mg daily on 6/27 due to rise in Cr.  Creatinine has continued to rise.  Note 1.1 liters UOP charted over 6/27.  Note tachycardia.  His daughter states he is to be discharged to a facility today.  They have been told by [facility] neph that his heart would not tolerate HD.   The patient would like to be discharged - he has told her "I know I'm going to die soon, I don't want to be here in the hospital".  His daughter states fluid builds up when diuretics held and cr increases with diuretics - hard to manage.  Review of systems  Pt denies shortness of breath; no home oxygen requirement Denies chest pain  Denies n/v Has condom cath    Objective:   BP 112/66 (BP Location: Left Arm)   Pulse 89   Temp 98.9 F (37.2 C) (Oral)   Resp 18   Ht 5\' 6"  (1.676 m)   Wt 71.4 kg   SpO2 98%   BMI 25.41 kg/m   Intake/Output Summary (Last 24 hours) at 03/21/2020 9485 Last data filed at 03/21/2020 0500 Gross per 24 hour  Intake 240 ml  Output 1100 ml  Net -860 ml   Weight change:   Physical Exam:  GEN: elderly male seated in chair  HEENT NCAT EOMI PULM reduced breath sounds on 2.5 liters oxygen  CV S1S2 no rub ABD: + soft and nontender midline hernia present EXT 1-2+ LE edema with unna boots NEURO AAO x 3 and tired but conversant GU - has condom cath  Imaging: No results found.  Labs: BMET Recent Labs  Lab 03/15/20 0401 03/16/20 0303 03/17/20 0338 03/18/20 0508 03/19/20 0506 03/20/20 0247 03/21/20 0413  NA 140 137 136 134* 134* 133* 132*  K 4.3 4.1 4.1 4.6 4.4 4.6 4.7  CL 96* 94* 96* 90* 89* 88* 88*  CO2 35* 31 33* 33* 32 32 32  GLUCOSE 114* 126* 107* 163* 125* 96 137*  BUN 50* 47* 50* 56* 61* 68* 88*  CREATININE 2.77* 2.66* 2.69* 3.03* 3.22* 3.75* 4.30*  CALCIUM 12.0* 12.4* 12.3* 12.2* 11.4* 11.1* 11.6*  PHOS 4.4 5.0* 5.2* 5.0* 5.6* 5.6* 6.3*   CBC Recent Labs  Lab 03/18/20 0508 03/18/20 0508 03/19/20 0506 03/19/20 1420 03/20/20 0247 03/21/20 0413  WBC 10.0  --  11.8*  --  8.6 10.9*  HGB 7.7*   < > 6.8* 8.3* 8.0* 7.2*  HCT 24.3*   < > 21.5* 25.4* 24.8* 22.5*  MCV 93.1  --  91.9  --  90.2 91.5  PLT 142*  --  142*  --  136* 134*   < > = values in this interval not displayed.    Medications:    . atorvastatin  20 mg Oral QHS  .  calcitonin (salmon)  2 spray Alternating Nares Daily  . feeding supplement  1 Container Oral BID BM  . feeding supplement (ENSURE ENLIVE)  237 mL Oral Q24H  . lidocaine  1 patch Transdermal Q24H  . metoprolol tartrate  12.5 mg Oral Daily  . sevelamer carbonate  800 mg Oral TID WC  . torsemide  40 mg Oral Daily  . Warfarin - Pharmacist Dosing Inpatient   Does not apply q1600  . zonisamide  300 mg Oral BID      Claudia Desanctis, MD 03/21/2020, 6:45 AM

## 2020-03-21 NOTE — Progress Notes (Signed)
Physical Therapy Treatment Patient Details Name: Andrew [name] MRN: [id number] DOB: [birth year] Today's Date: 03/21/2020    History of Present Illness 86 y.o. male admitted with weakness on 6/18.  MRI/CT on 03/11/20 findings consistent with a meningioma within the left frontal lobe. Remote lacunar infarcts involving the left basal ganglia and left thalamus. Pt had episode of chest pain today - MD note reports VSS and "EKG obtained at bedside with atrial fibrillation and PVCs; however, no significant ST segment or T wave changes noted. No immediate interventions at this time., Past medical history of severe bilateral carpal tunnel, dysphagia, AAA without rupture, atrial fibrillation on warfarin, CKD stage III/IV,  and systolic heart failure.    PT Comments    Pt complaining of significant back and LLE/groin pain this day, but agreeable to mobility to sink with encouragement from PT and daughter in room. Pt tolerated ~10 ft ambulation in room only, struggling with foot clearance during gait L>R due to pain. Pt required PT assist for physically weight shifting to progress gait. Pt up at sink in chair to prepare for wash up with NT, current d/c plan remains appropriate.   Follow Up Recommendations  SNF     Equipment Recommendations  None recommended by PT    Recommendations for Other Services       Precautions / Restrictions Precautions Precautions: Fall Restrictions Weight Bearing Restrictions: No    Mobility  Bed Mobility Overal bed mobility: Needs Assistance             General bed mobility comments: in recliner at start of session  Transfers Overall transfer level: Needs assistance Equipment used: Rolling walker (2 wheeled) Transfers: Sit to/from Stand Sit to Stand: Mod assist;+2 physical assistance;+2 safety/equipment;From elevated surface         General transfer comment: Mod assist +2 for power up, trunk elevation to upright, and steadying upon standing, and slow  eccentric lower when moving from stand to sit. Sit to stand x2 from recliner, requires verbal cuing for hand placement when rising and sitting.  Ambulation/Gait Ambulation/Gait assistance: Min assist;+2 safety/equipment Gait Distance (Feet): 8 Feet Assistive device: Rolling walker (2 wheeled) Gait Pattern/deviations: Trunk flexed;Wide base of support;Step-through pattern;Decreased stride length;Decreased dorsiflexion - left;Decreased dorsiflexion - right Gait velocity: decr   General Gait Details: Min assist +2 for steadying, guiding pt and RW, lateral weight shifting for forward progression of feet. Pt very limited by L groin pain   Stairs             Wheelchair Mobility    Modified Rankin (Stroke Patients Only) Modified Rankin (Stroke Patients Only) Pre-Morbid Rankin Score: Moderate disability Modified Rankin: Moderately severe disability     Balance Overall balance assessment: Needs assistance Sitting-balance support: Feet supported Sitting balance-Leahy Scale: Fair     Standing balance support: Bilateral upper extremity supported Standing balance-Leahy Scale: Poor Standing balance comment: reliant on external support                            Cognition Arousal/Alertness: Awake/alert Behavior During Therapy: Flat affect Overall Cognitive Status: Within Functional Limits for tasks assessed                                 General Comments: flat affect remains throughout session, follows commands with increased time suspect mostly due to pain.      Exercises General Exercises -  Lower Extremity Hip Flexion/Marching: AROM;Both;10 reps;Standing    General Comments        Pertinent Vitals/Pain Pain Assessment: Faces Faces Pain Scale: Hurts even more Pain Location: LEs, back Pain Descriptors / Indicators: Sore;Discomfort;Grimacing Pain Intervention(s): Limited activity within patient's tolerance;Monitored during session;Repositioned     Home Living                      Prior Function            PT Goals (current goals can now be found in the care plan section) Acute Rehab PT Goals Patient Stated Goal: go to SNF PT Goal Formulation: With patient/family Time For Goal Achievement: 03/27/20 Potential to Achieve Goals: Good Progress towards PT goals: Progressing toward goals    Frequency    Min 3X/week      PT Plan Current plan remains appropriate    Co-evaluation              AM-PAC PT "6 Clicks" Mobility   Outcome Measure  Help needed turning from your back to your side while in a flat bed without using bedrails?: A Little Help needed moving from lying on your back to sitting on the side of a flat bed without using bedrails?: A Little Help needed moving to and from a bed to a chair (including a wheelchair)?: A Lot Help needed standing up from a chair using your arms (e.g., wheelchair or bedside chair)?: A Lot Help needed to walk in hospital room?: A Lot Help needed climbing 3-5 steps with a railing? : A Lot 6 Click Score: 14    End of Session Equipment Utilized During Treatment: Gait belt (O2 off, preparing for wash up with assist of NT) Activity Tolerance: Patient tolerated treatment well Patient left: with call bell/phone within reach;with family/visitor present;in chair (NT in room to assist with wash up) Nurse Communication: Mobility status (spoke with NT) PT Visit Diagnosis: Other abnormalities of gait and mobility (R26.89);History of falling (Z91.81);Muscle weakness (generalized) (M62.81);Unsteadiness on feet (R26.81)     Time: 1050-1110 PT Time Calculation (min) (ACUTE ONLY): 20 min  Charges:  $Therapeutic Activity: 8-22 mins                     Dayanna Pryce E, PT Acute Rehabilitation Services Pager [phone]  Office [phone]    Toluwani Ruder D Elonda Husky 03/21/2020, 12:57 PM

## 2020-03-21 NOTE — Progress Notes (Signed)
[facility] for Warfarin  Indication: atrial fibrillation  Allergies  Allergen Reactions  . Tegretol [Carbamazepine] Other (See Comments)    Made patient's feet swell  . Depakote [Divalproex Sodium] Rash  . Dilantin [Phenytoin Sodium Extended] Rash  . Phenobarbital Rash    Patient Measurements: Height: 5\' 6"  (167.6 cm) Weight: 71.4 kg (157 lb 6.5 oz) IBW/kg (Calculated) : 63.8  Vital Signs: Temp: 98.9 F (37.2 C) (06/28 0103) Temp Source: Oral (06/28 0103) BP: 112/66 (06/28 0103) Pulse Rate: 89 (06/28 0103)  Labs: Recent Labs    03/19/20 0506 03/19/20 0506 03/19/20 1420 03/19/20 1420 03/20/20 0247 03/21/20 0413  HGB 6.8*   < > 8.3*   < > 8.0* 7.2*  HCT 21.5*   < > 25.4*  --  24.8* 22.5*  PLT 142*  --   --   --  136* 134*  LABPROT 37.2*  --   --   --  34.4* 29.4*  INR 3.9*  --   --   --  3.6* 2.9*  CREATININE 3.22*  --   --   --  3.75* 4.30*   < > = values in this interval not displayed.    Estimated Creatinine Clearance: 11.5 mL/min (A) (by C-G formula based on SCr of 4.3 mg/dL (H)).  Assessment: Patient is a 69 yom that presented to the ED with c/o right arm weakness. Patient has a hx of Afib and is on warfarin PTA for this.  Home warfarin regimen is 6 mg on Tuesday, Thursday, Saturday and 3 mg on all other days.    INR is back down to therapeutic range at 2.9 today after holding 2 doses of warfarin. No overt bleeding noted, Hgb 7.2 today, s/p 1 unit PRBC 6/26, platelets stable 130-140s. PO intake has been variable - 20 to 100% intake recorded.   Goal of Therapy:  INR 2-3 Monitor platelets by anticoagulation protocol: Yes   Plan:  Restart Warfarin 3 mg x1 today. Daily INR Monitor for s/sx of bleeding  If patient to discharge to SNF, would recommend continuing Warfarin 3mg  daily with INR check in 2-3 days.   Thank you for involving pharmacy in this patient's care.  Sloan Leiter, PharmD, BCPS, BCCCP Clinical  Pharmacist Please refer to [facility] for [place] numbers 03/21/2020 7:31 AM  **Pharmacist phone directory can be found on [email or web address] listed under [place]**

## 2020-03-23 DIAGNOSIS — E43 Unspecified severe protein-calorie malnutrition: Secondary | ICD-10-CM | POA: Diagnosis not present

## 2020-03-23 DIAGNOSIS — I4891 Unspecified atrial fibrillation: Secondary | ICD-10-CM | POA: Diagnosis not present

## 2020-03-23 DIAGNOSIS — I509 Heart failure, unspecified: Secondary | ICD-10-CM | POA: Diagnosis not present

## 2020-03-23 DIAGNOSIS — L03317 Cellulitis of buttock: Secondary | ICD-10-CM | POA: Diagnosis not present

## 2020-03-24 LAB — PTH-RELATED PEPTIDE: PTH-related peptide: <2 pmol/L

## 2020-04-24 DEATH — deceased

## 2020-04-26 ENCOUNTER — Ambulatory Visit: Payer: Medicare Other | Admitting: Cardiology

## 2021-02-02 IMAGING — MR MR HEAD W/O CM
7 of 8 series · 43 of 48 positions shown · non-contrast
Comparison: Prior CT from earlier the same day.

CLINICAL DATA: Initial evaluation for acute right-sided weakness,
slurred speech, failure to thrive.

EXAM:
MRI HEAD WITHOUT CONTRAST
TECHNIQUE: Multiplanar, multiecho pulse sequences of the brain and surrounding
structures were obtained without intravenous contrast.

[Series 2: DWI · axial · 3.0mm · 0.94mm/px · z∈[-27,+117]mm · 9 of 100 slices shown (1 of 4)]
[im 1/100]
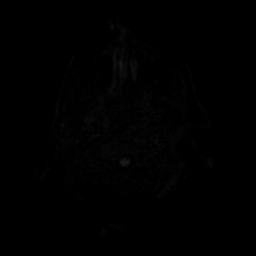
[im 13/100]
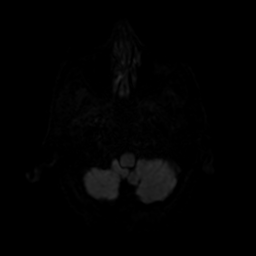
[im 25/100]
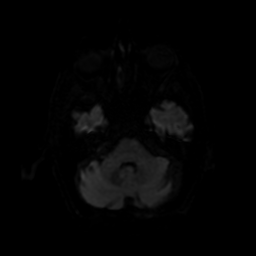
[im 38/100]
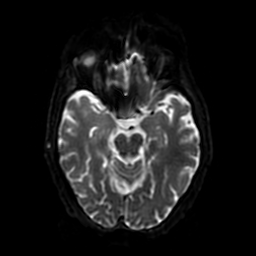
[im 50/100]
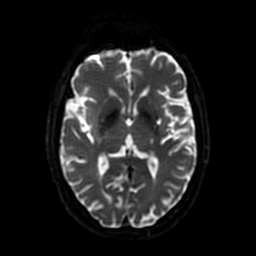
[im 62/100]
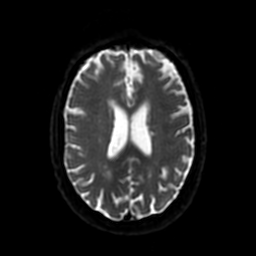
[im 75/100]
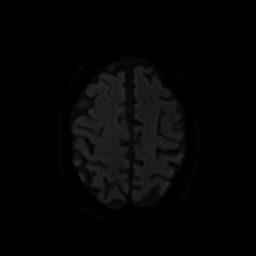
[im 87/100]
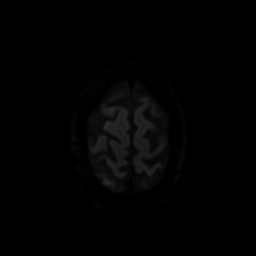
[im 100/100]
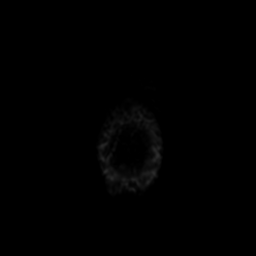

[Series 3: DWI · coronal · 4.0mm · 0.94mm/px · 7 of 74 slices shown (2 of 4)]
[im 1/74]
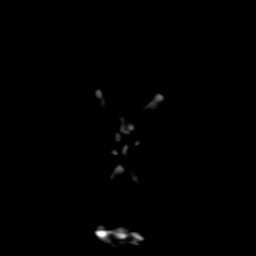
[im 13/74]
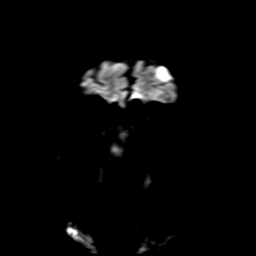
[im 25/74]
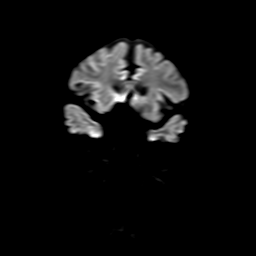
[im 37/74]
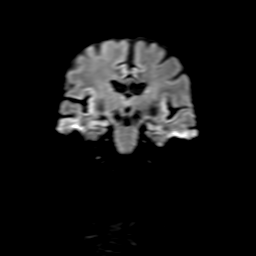
[im 49/74]
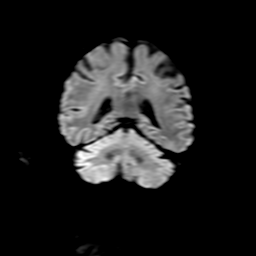
[im 61/74]
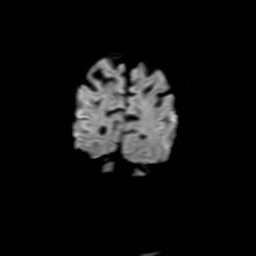
[im 74/74]
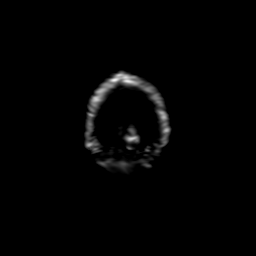

[Series 5: FLAIR · axial · 5.0mm · 0.47mm/px · z∈[-22,+122]mm · 3 of 26 slices shown]
[im 1/26]
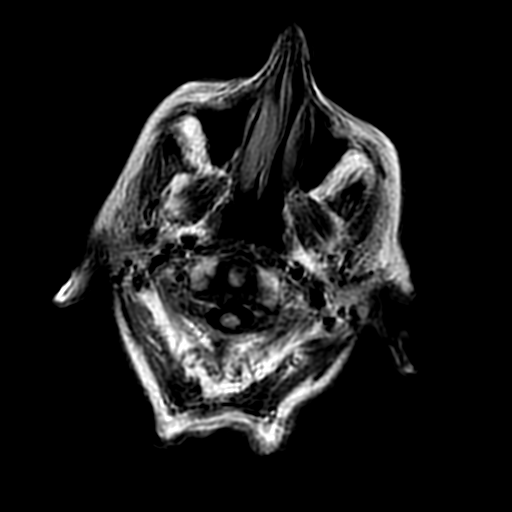
[im 13/26]
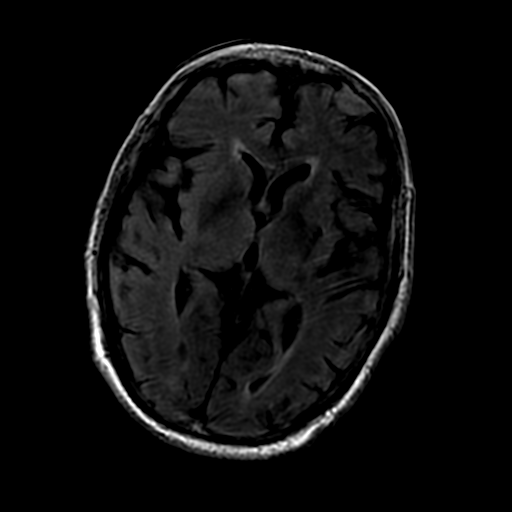
[im 26/26]
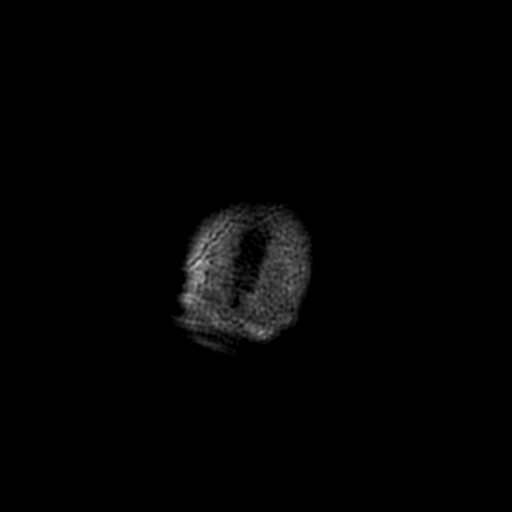

[Series 210: DWI · axial · 3.0mm · 0.94mm/px · z∈[-27,+117]mm · 8 of 100 slices shown (3 of 4)]
[im 1/100]
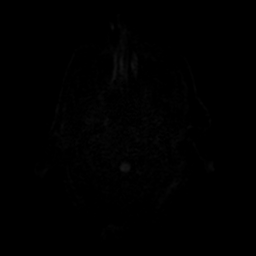
[im 12/100]
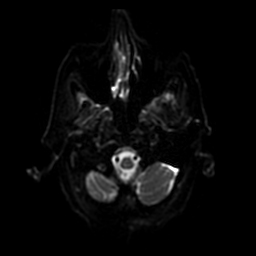
[im 34/100]
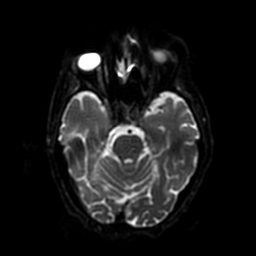
[im 45/100]
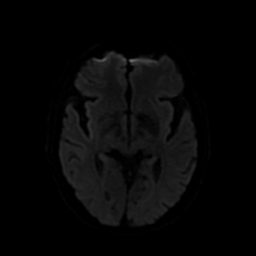
[im 56/100]
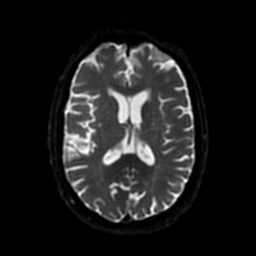
[im 67/100]
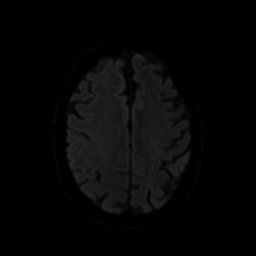
[im 89/100]
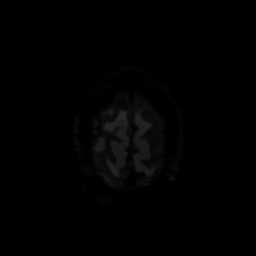
[im 100/100]
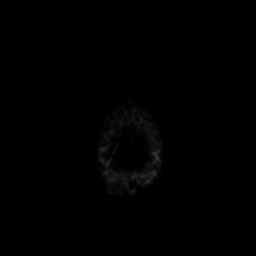

[Series 250: ADC · axial · 3.0mm · 0.94mm/px · z∈[-27,+117]mm · 5 of 50 slices shown (1 of 2)]
[im 1/50]
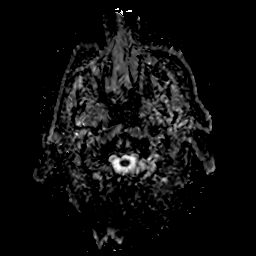
[im 13/50]
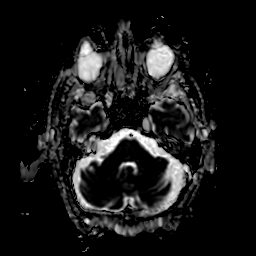
[im 25/50]
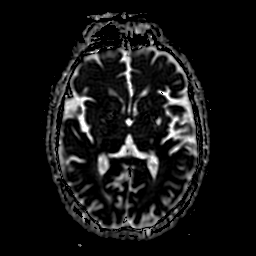
[im 37/50]
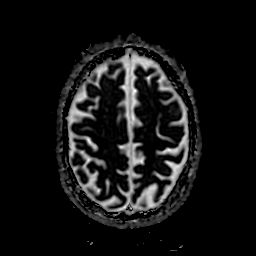
[im 50/50]
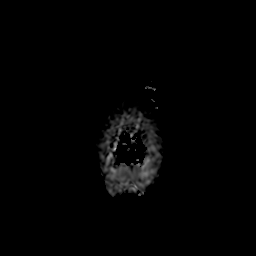

[Series 310: DWI · coronal · 4.0mm · 0.94mm/px · 7 of 74 slices shown (4 of 4)]
[im 1/74]
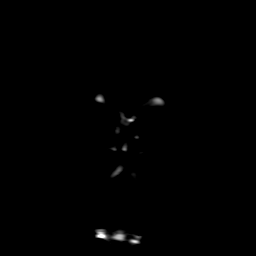
[im 13/74]
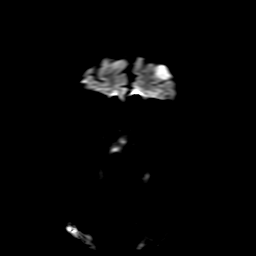
[im 25/74]
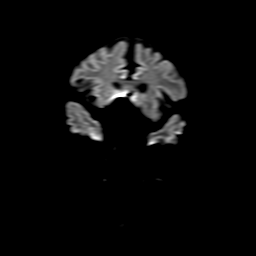
[im 37/74]
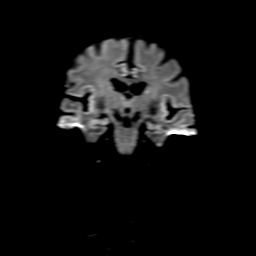
[im 49/74]
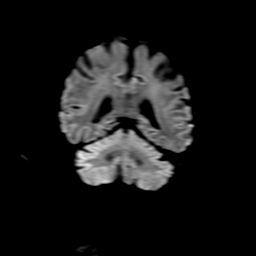
[im 61/74]
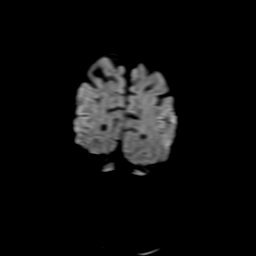
[im 74/74]
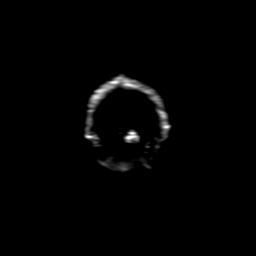

[Series 350: ADC · coronal · 4.0mm · 0.94mm/px · 4 of 37 slices shown (2 of 2)]
[im 1/37]
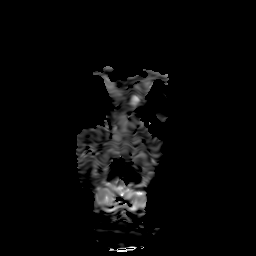
[im 13/37]
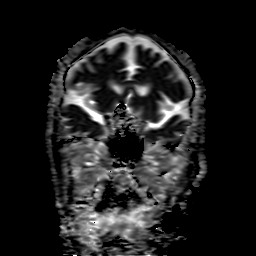
[im 25/37]
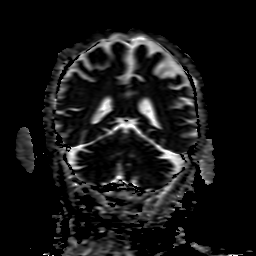
[im 37/37]
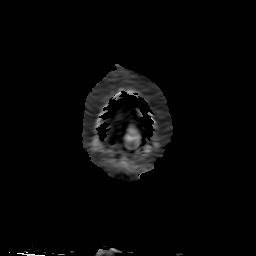

[43 of 48 positions shown; findings below may reference images not displayed]

FINDINGS: Brain: Examination technically limited as the patient was unable to
tolerate the full length of the exam. Diffusion-weighted imaging
with axial T2 and FLAIR weighted sequences only were performed.
Additionally, images provided are moderately degraded by motion
artifact.

Generalized age-related cerebral atrophy. Mild chronic microvascular
ischemic changes present within the supratentorial cerebral white
matter. Remote lacunar infarcts noted at the left basal ganglia and
left thalamus.

No abnormal foci of restricted diffusion to suggest acute or
subacute ischemia. Gray-white matter differentiation maintained. No
other appreciable areas of encephalomalacia to suggest chronic
cortical infarction. No obvious evidence for acute intracranial
hemorrhage.

18 mm meningioma overlies the anterior left frontal convexity
(series 4, image 15). No associated mass effect. No other mass
lesion. No midline shift or extra-axial fluid collection. Ventricles
normal size without hydrocephalus.

Vascular: Major intracranial vascular flow voids are grossly
maintained at the skull base.

Skull and upper cervical spine: Craniocervical junction grossly
within normal limits on this motion degraded exam. Bone marrow
signal intensity within normal limits. No scalp soft tissue
abnormality.

Sinuses/Orbits: Patient status post bilateral ocular lens
replacement. Globes and orbital soft tissues demonstrate no acute
finding. Right sphenoid sinus retention cyst noted. Scattered
mucosal thickening noted within the frontoethmoidal sinuses. No
significant mastoid effusion.

Other: None.
IMPRESSION: 1. Technically limited exam due to patient's inability to tolerate
the full length of the exam and motion artifact.
2. No acute intracranial abnormality identified.
3. Remote lacunar infarcts involving the left basal ganglia and left
thalamus.
4. 18 mm meningioma overlying the anterior left frontal convexity
without associated mass effect.
5. Underlying mild chronic microvascular ischemic disease.

## 2021-02-05 IMAGING — CR DG CHEST 1V
1 series · 1 of 1 positions shown · non-contrast
Comparison: Portable exam 7077 hours compared to 07/01/2009

CLINICAL DATA: Shortness of breath today

EXAM:
CHEST  1 VIEW

[chest ap]
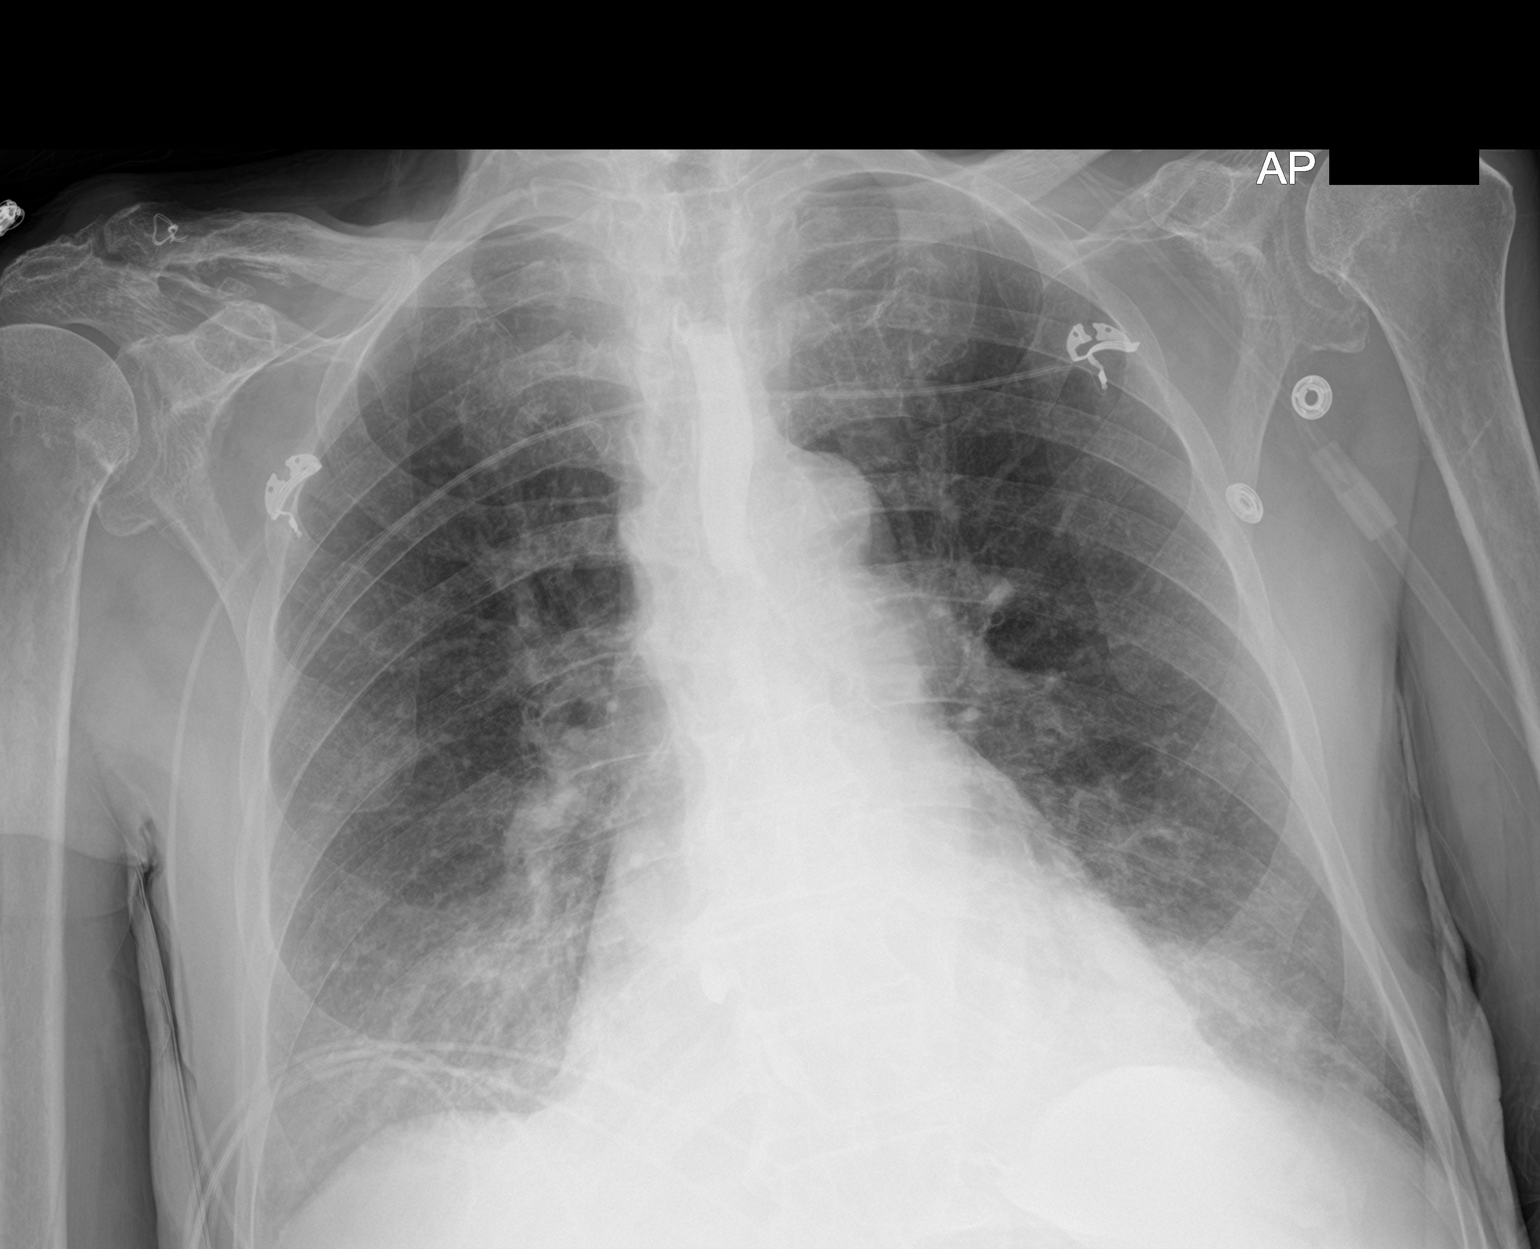

[1 of 1 positions shown; findings below may reference images not displayed]

FINDINGS: Normal heart size, mediastinal contours, and pulmonary vascularity.

Contrast in thoracic esophagus and stomach.

Peribronchial thickening with bibasilar opacities which could
represent atelectasis or infiltrate increased since prior study.

Upper lungs clear.

No pleural effusion or pneumothorax.

Bones demineralized.
IMPRESSION: Retained contrast in the esophagus.

Bronchitic changes with bibasilar opacities question atelectasis
versus infiltrate.
